# Patient Record
Sex: Female | Born: 1980 | Race: White | Hispanic: No | Marital: Married | Smoking: Current every day smoker
Health system: Southern US, Community
[De-identification: ages and names within clinical notes are randomized; demographics above are authoritative.]

## PROBLEM LIST (undated history)

## (undated) DIAGNOSIS — E119 Type 2 diabetes mellitus without complications: Secondary | ICD-10-CM

## (undated) DIAGNOSIS — IMO0002 Reserved for concepts with insufficient information to code with codable children: Secondary | ICD-10-CM

## (undated) DIAGNOSIS — M329 Systemic lupus erythematosus, unspecified: Secondary | ICD-10-CM

## (undated) DIAGNOSIS — I10 Essential (primary) hypertension: Secondary | ICD-10-CM

## (undated) HISTORY — PX: UTERINE FIBROID SURGERY: SHX826

## (undated) HISTORY — PX: WRIST SURGERY: SHX841

## (undated) HISTORY — PX: SHOULDER SURGERY: SHX246

## (undated) HISTORY — DX: Systemic lupus erythematosus, unspecified: M32.9

---

## 2002-11-13 ENCOUNTER — Ambulatory Visit (HOSPITAL_COMMUNITY): Admission: RE | Admit: 2002-11-13 | Discharge: 2002-11-13 | Payer: Self-pay | Admitting: Family Medicine

## 2002-11-13 ENCOUNTER — Encounter: Payer: Self-pay | Admitting: Family Medicine

## 2002-12-09 ENCOUNTER — Encounter: Admission: RE | Admit: 2002-12-09 | Discharge: 2003-03-09 | Payer: Self-pay | Admitting: Family Medicine

## 2002-12-15 ENCOUNTER — Other Ambulatory Visit: Admission: RE | Admit: 2002-12-15 | Discharge: 2002-12-15 | Payer: Self-pay | Admitting: Obstetrics and Gynecology

## 2003-02-17 ENCOUNTER — Encounter: Payer: Self-pay | Admitting: Obstetrics and Gynecology

## 2003-02-17 ENCOUNTER — Ambulatory Visit (HOSPITAL_COMMUNITY): Admission: RE | Admit: 2003-02-17 | Discharge: 2003-02-17 | Payer: Self-pay | Admitting: Obstetrics and Gynecology

## 2003-05-18 ENCOUNTER — Inpatient Hospital Stay (HOSPITAL_COMMUNITY): Admission: AD | Admit: 2003-05-18 | Discharge: 2003-05-22 | Payer: Self-pay | Admitting: Obstetrics and Gynecology

## 2003-05-19 ENCOUNTER — Encounter (INDEPENDENT_AMBULATORY_CARE_PROVIDER_SITE_OTHER): Payer: Self-pay | Admitting: Specialist

## 2003-10-05 ENCOUNTER — Inpatient Hospital Stay (HOSPITAL_COMMUNITY): Admission: EM | Admit: 2003-10-05 | Discharge: 2003-10-06 | Payer: Self-pay | Admitting: Emergency Medicine

## 2004-02-28 ENCOUNTER — Inpatient Hospital Stay (HOSPITAL_COMMUNITY): Admission: EM | Admit: 2004-02-28 | Discharge: 2004-03-01 | Payer: Self-pay | Admitting: Emergency Medicine

## 2006-07-18 ENCOUNTER — Observation Stay (HOSPITAL_COMMUNITY): Admission: AD | Admit: 2006-07-18 | Discharge: 2006-07-19 | Payer: Self-pay | Admitting: Obstetrics and Gynecology

## 2006-07-20 ENCOUNTER — Inpatient Hospital Stay (HOSPITAL_COMMUNITY): Admission: AD | Admit: 2006-07-20 | Discharge: 2006-07-20 | Payer: Self-pay | Admitting: Obstetrics and Gynecology

## 2006-07-24 ENCOUNTER — Inpatient Hospital Stay (HOSPITAL_COMMUNITY): Admission: AD | Admit: 2006-07-24 | Discharge: 2006-07-24 | Payer: Self-pay | Admitting: Obstetrics and Gynecology

## 2006-08-08 ENCOUNTER — Ambulatory Visit (HOSPITAL_COMMUNITY): Admission: RE | Admit: 2006-08-08 | Discharge: 2006-08-08 | Payer: Self-pay | Admitting: Obstetrics and Gynecology

## 2006-08-08 ENCOUNTER — Encounter (INDEPENDENT_AMBULATORY_CARE_PROVIDER_SITE_OTHER): Payer: Self-pay | Admitting: Specialist

## 2006-12-05 ENCOUNTER — Encounter (INDEPENDENT_AMBULATORY_CARE_PROVIDER_SITE_OTHER): Payer: Self-pay | Admitting: Obstetrics and Gynecology

## 2006-12-05 ENCOUNTER — Ambulatory Visit (HOSPITAL_COMMUNITY): Admission: RE | Admit: 2006-12-05 | Discharge: 2006-12-05 | Payer: Self-pay | Admitting: Obstetrics and Gynecology

## 2007-07-30 ENCOUNTER — Inpatient Hospital Stay (HOSPITAL_COMMUNITY): Admission: AD | Admit: 2007-07-30 | Discharge: 2007-07-30 | Payer: Self-pay | Admitting: Obstetrics and Gynecology

## 2007-11-15 ENCOUNTER — Ambulatory Visit (HOSPITAL_COMMUNITY): Admission: RE | Admit: 2007-11-15 | Discharge: 2007-11-16 | Payer: Self-pay | Admitting: Obstetrics and Gynecology

## 2007-11-15 ENCOUNTER — Encounter (INDEPENDENT_AMBULATORY_CARE_PROVIDER_SITE_OTHER): Payer: Self-pay | Admitting: Obstetrics and Gynecology

## 2009-08-30 ENCOUNTER — Ambulatory Visit (HOSPITAL_COMMUNITY): Admission: RE | Admit: 2009-08-30 | Discharge: 2009-08-30 | Payer: Self-pay | Admitting: Obstetrics and Gynecology

## 2009-11-03 ENCOUNTER — Ambulatory Visit (HOSPITAL_COMMUNITY): Admission: RE | Admit: 2009-11-03 | Discharge: 2009-11-03 | Payer: Self-pay | Admitting: Obstetrics and Gynecology

## 2009-12-08 ENCOUNTER — Emergency Department (HOSPITAL_COMMUNITY): Admission: EM | Admit: 2009-12-08 | Discharge: 2009-12-08 | Payer: Self-pay | Admitting: Emergency Medicine

## 2009-12-10 ENCOUNTER — Inpatient Hospital Stay (HOSPITAL_COMMUNITY): Admission: EM | Admit: 2009-12-10 | Discharge: 2009-12-13 | Payer: Self-pay | Admitting: Emergency Medicine

## 2010-04-29 ENCOUNTER — Ambulatory Visit (HOSPITAL_COMMUNITY)
Admission: RE | Admit: 2010-04-29 | Discharge: 2010-04-29 | Payer: Self-pay | Source: Home / Self Care | Attending: Obstetrics and Gynecology | Admitting: Obstetrics and Gynecology

## 2010-07-22 LAB — COMPREHENSIVE METABOLIC PANEL
AST: 18 U/L (ref 0–37)
Albumin: 3 g/dL — ABNORMAL LOW (ref 3.5–5.2)
BUN: 2 mg/dL — ABNORMAL LOW (ref 6–23)
CO2: 20 mEq/L (ref 19–32)
Calcium: 8.3 mg/dL — ABNORMAL LOW (ref 8.4–10.5)
Chloride: 105 mEq/L (ref 96–112)
Creatinine, Ser: 0.66 mg/dL (ref 0.4–1.2)
GFR calc Af Amer: >60 mL/min (ref >60)
Potassium: 3.8 mEq/L (ref 3.5–5.1)
Total Bilirubin: 0.7 mg/dL (ref 0.3–1.2)
Total Bilirubin: 1 mg/dL (ref 0.3–1.2)

## 2010-07-22 LAB — CARDIAC PANEL(CRET KIN+CKTOT+MB+TROPI)
CK, MB: 0.9 ng/mL (ref 0.3–4.0)
Relative Index: INVALID (ref 0.0–2.5)
Relative Index: INVALID (ref 0.0–2.5)
Total CK: 67 U/L (ref 7–177)
Troponin I: 0.01 ng/mL (ref 0.00–0.06)
Troponin I: 0.02 ng/mL (ref 0.00–0.06)

## 2010-07-22 LAB — GLUCOSE, CAPILLARY
Glucose-Capillary: 100 mg/dL — ABNORMAL HIGH (ref 70–99)
Glucose-Capillary: 111 mg/dL — ABNORMAL HIGH (ref 70–99)
Glucose-Capillary: 127 mg/dL — ABNORMAL HIGH (ref 70–99)
Glucose-Capillary: 128 mg/dL — ABNORMAL HIGH (ref 70–99)
Glucose-Capillary: 128 mg/dL — ABNORMAL HIGH (ref 70–99)
Glucose-Capillary: 143 mg/dL — ABNORMAL HIGH (ref 70–99)
Glucose-Capillary: 152 mg/dL — ABNORMAL HIGH (ref 70–99)
Glucose-Capillary: 153 mg/dL — ABNORMAL HIGH (ref 70–99)
Glucose-Capillary: 154 mg/dL — ABNORMAL HIGH (ref 70–99)
Glucose-Capillary: 157 mg/dL — ABNORMAL HIGH (ref 70–99)
Glucose-Capillary: 168 mg/dL — ABNORMAL HIGH (ref 70–99)
Glucose-Capillary: 170 mg/dL — ABNORMAL HIGH (ref 70–99)
Glucose-Capillary: 179 mg/dL — ABNORMAL HIGH (ref 70–99)
Glucose-Capillary: 183 mg/dL — ABNORMAL HIGH (ref 70–99)
Glucose-Capillary: 186 mg/dL — ABNORMAL HIGH (ref 70–99)
Glucose-Capillary: 203 mg/dL — ABNORMAL HIGH (ref 70–99)
Glucose-Capillary: 239 mg/dL — ABNORMAL HIGH (ref 70–99)
Glucose-Capillary: 241 mg/dL — ABNORMAL HIGH (ref 70–99)
Glucose-Capillary: 300 mg/dL — ABNORMAL HIGH (ref 70–99)
Glucose-Capillary: 308 mg/dL — ABNORMAL HIGH (ref 70–99)
Glucose-Capillary: 334 mg/dL — ABNORMAL HIGH (ref 70–99)
Glucose-Capillary: 417 mg/dL — ABNORMAL HIGH (ref 70–99)
Glucose-Capillary: 57 mg/dL — ABNORMAL LOW (ref 70–99)
Glucose-Capillary: 94 mg/dL (ref 70–99)

## 2010-07-22 LAB — URINALYSIS, ROUTINE W REFLEX MICROSCOPIC
Bilirubin Urine: NEGATIVE
Glucose, UA: >1000 mg/dL — AB
Ketones, ur: 40 mg/dL — AB
Leukocytes, UA: NEGATIVE
Nitrite: NEGATIVE
Nitrite: NEGATIVE
Protein, ur: 30 mg/dL — AB
Specific Gravity, Urine: 1.037 — ABNORMAL HIGH (ref 1.005–1.030)
Urobilinogen, UA: 0.2 mg/dL (ref 0.0–1.0)
pH: 5.5 (ref 5.0–8.0)

## 2010-07-22 LAB — BASIC METABOLIC PANEL
BUN: 13 mg/dL (ref 6–23)
BUN: 2 mg/dL — ABNORMAL LOW (ref 6–23)
BUN: 24 mg/dL — ABNORMAL HIGH (ref 6–23)
BUN: 6 mg/dL (ref 6–23)
CO2: 19 mEq/L (ref 19–32)
CO2: 21 mEq/L (ref 19–32)
CO2: 24 mEq/L (ref 19–32)
CO2: 25 mEq/L (ref 19–32)
CO2: 28 mEq/L (ref 19–32)
Calcium: 7.9 mg/dL — ABNORMAL LOW (ref 8.4–10.5)
Calcium: 8 mg/dL — ABNORMAL LOW (ref 8.4–10.5)
Calcium: 8.1 mg/dL — ABNORMAL LOW (ref 8.4–10.5)
Calcium: 9.2 mg/dL (ref 8.4–10.5)
Chloride: 104 mEq/L (ref 96–112)
Chloride: 104 mEq/L (ref 96–112)
Chloride: 104 mEq/L (ref 96–112)
Chloride: 106 mEq/L (ref 96–112)
Chloride: 107 mEq/L (ref 96–112)
Chloride: 107 mEq/L (ref 96–112)
Chloride: 112 mEq/L (ref 96–112)
Creatinine, Ser: 0.61 mg/dL (ref 0.4–1.2)
Creatinine, Ser: 0.62 mg/dL (ref 0.4–1.2)
Creatinine, Ser: 0.63 mg/dL (ref 0.4–1.2)
Creatinine, Ser: 0.73 mg/dL (ref 0.4–1.2)
Creatinine, Ser: 0.92 mg/dL (ref 0.4–1.2)
Creatinine, Ser: 1 mg/dL (ref 0.4–1.2)
Creatinine, Ser: 1.26 mg/dL — ABNORMAL HIGH (ref 0.4–1.2)
GFR calc Af Amer: 60 mL/min — ABNORMAL LOW (ref >60)
GFR calc Af Amer: >60 mL/min (ref >60)
GFR calc Af Amer: >60 mL/min (ref >60)
GFR calc Af Amer: >60 mL/min (ref >60)
GFR calc Af Amer: >60 mL/min (ref >60)
GFR calc Af Amer: >60 mL/min (ref >60)
GFR calc non Af Amer: >60 mL/min (ref >60)
GFR calc non Af Amer: >60 mL/min (ref >60)
GFR calc non Af Amer: >60 mL/min (ref >60)
Glucose, Bld: 193 mg/dL — ABNORMAL HIGH (ref 70–99)
Glucose, Bld: 241 mg/dL — ABNORMAL HIGH (ref 70–99)
Glucose, Bld: 323 mg/dL — ABNORMAL HIGH (ref 70–99)
Glucose, Bld: 79 mg/dL (ref 70–99)
Potassium: 3.2 mEq/L — ABNORMAL LOW (ref 3.5–5.1)
Potassium: 3.3 mEq/L — ABNORMAL LOW (ref 3.5–5.1)
Potassium: 3.4 mEq/L — ABNORMAL LOW (ref 3.5–5.1)
Potassium: 4 mEq/L (ref 3.5–5.1)
Sodium: 137 mEq/L (ref 135–145)
Sodium: 139 mEq/L (ref 135–145)
Sodium: 140 mEq/L (ref 135–145)

## 2010-07-22 LAB — CBC
HCT: 31.3 % — ABNORMAL LOW (ref 36.0–46.0)
HCT: 34.1 % — ABNORMAL LOW (ref 36.0–46.0)
HCT: 37.2 % (ref 36.0–46.0)
Hemoglobin: 11.3 g/dL — ABNORMAL LOW (ref 12.0–15.0)
Hemoglobin: 11.8 g/dL — ABNORMAL LOW (ref 12.0–15.0)
Hemoglobin: 12.4 g/dL (ref 12.0–15.0)
MCH: 26.9 pg (ref 26.0–34.0)
MCH: 27.1 pg (ref 26.0–34.0)
MCH: 27.1 pg (ref 26.0–34.0)
MCHC: 33.1 g/dL (ref 30.0–36.0)
MCHC: 33.4 g/dL (ref 30.0–36.0)
MCV: 80.6 fL (ref 78.0–100.0)
MCV: 80.9 fL (ref 78.0–100.0)
MCV: 81 fL (ref 78.0–100.0)
Platelets: 214 10*3/uL (ref 150–400)
Platelets: 218 10*3/uL (ref 150–400)
Platelets: 237 10*3/uL (ref 150–400)
Platelets: 272 10*3/uL (ref 150–400)
RBC: 4.37 MIL/uL (ref 3.87–5.11)
RBC: 4.6 MIL/uL (ref 3.87–5.11)
RDW: 17.3 % — ABNORMAL HIGH (ref 11.5–15.5)
WBC: 17.6 10*3/uL — ABNORMAL HIGH (ref 4.0–10.5)
WBC: 7.4 10*3/uL (ref 4.0–10.5)

## 2010-07-22 LAB — DIFFERENTIAL
Basophils Absolute: 0.1 10*3/uL (ref 0.0–0.1)
Basophils Relative: 1 % (ref 0–1)
Eosinophils Absolute: 0 10*3/uL (ref 0.0–0.7)
Eosinophils Relative: 0 % (ref 0–5)
Lymphocytes Relative: 11 % — ABNORMAL LOW (ref 12–46)
Lymphocytes Relative: 4 % — ABNORMAL LOW (ref 12–46)
Lymphs Abs: 0.7 10*3/uL (ref 0.7–4.0)
Lymphs Abs: 2 10*3/uL (ref 0.7–4.0)
Monocytes Absolute: 0.5 10*3/uL (ref 0.1–1.0)
Monocytes Absolute: 0.7 10*3/uL (ref 0.1–1.0)
Monocytes Relative: 3 % (ref 3–12)
Neutro Abs: 15.7 10*3/uL — ABNORMAL HIGH (ref 1.7–7.7)
Neutrophils Relative %: 85 % — ABNORMAL HIGH (ref 43–77)
Neutrophils Relative %: 93 % — ABNORMAL HIGH (ref 43–77)

## 2010-07-22 LAB — URINE MICROSCOPIC-ADD ON

## 2010-07-22 LAB — BLOOD GAS, ARTERIAL
Bicarbonate: 16.2 mEq/L — ABNORMAL LOW (ref 20.0–24.0)
Drawn by: 311371
pCO2 arterial: 25.7 mmHg — ABNORMAL LOW (ref 35.0–45.0)
pH, Arterial: 7.4 (ref 7.350–7.400)
pO2, Arterial: 65.9 mmHg — ABNORMAL LOW (ref 80.0–100.0)

## 2010-07-22 LAB — HEPATIC FUNCTION PANEL
ALT: 10 U/L (ref 0–35)
ALT: 11 U/L (ref 0–35)
AST: 13 U/L (ref 0–37)
AST: 15 U/L (ref 0–37)
Albumin: 2.9 g/dL — ABNORMAL LOW (ref 3.5–5.2)
Alkaline Phosphatase: 68 U/L (ref 39–117)
Indirect Bilirubin: 0.9 mg/dL (ref 0.3–0.9)
Total Bilirubin: 0.4 mg/dL (ref 0.3–1.2)
Total Protein: 6.1 g/dL (ref 6.0–8.3)

## 2010-07-22 LAB — POCT PREGNANCY, URINE: Preg Test, Ur: NEGATIVE

## 2010-07-22 LAB — KETONES, QUALITATIVE

## 2010-07-22 LAB — LIPASE, BLOOD: Lipase: 18 U/L (ref 11–59)

## 2010-07-24 LAB — SURGICAL PCR SCREEN: MRSA, PCR: NEGATIVE

## 2010-07-24 LAB — GLUCOSE, CAPILLARY
Glucose-Capillary: 104 mg/dL — ABNORMAL HIGH (ref 70–99)
Glucose-Capillary: 152 mg/dL — ABNORMAL HIGH (ref 70–99)
Glucose-Capillary: 98 mg/dL (ref 70–99)

## 2010-07-24 LAB — CBC
Hemoglobin: 13.1 g/dL (ref 12.0–15.0)
Platelets: 335 10*3/uL (ref 150–400)
RDW: 17.1 % — ABNORMAL HIGH (ref 11.5–15.5)

## 2010-07-24 LAB — COMPREHENSIVE METABOLIC PANEL
ALT: 10 U/L (ref 0–35)
Albumin: 4.3 g/dL (ref 3.5–5.2)
Alkaline Phosphatase: 66 U/L (ref 39–117)
Potassium: 4.3 mEq/L (ref 3.5–5.1)
Sodium: 137 mEq/L (ref 135–145)
Total Protein: 7.8 g/dL (ref 6.0–8.3)

## 2010-07-24 LAB — PREGNANCY, URINE: Preg Test, Ur: NEGATIVE

## 2010-09-20 NOTE — H&P (Signed)
NAMEKAIJAH, ABTS                  ACCOUNT NO.:  [id number]   MEDICAL RECORD NO.:  [id number]          PATIENT TYPE:  AMB   LOCATION:  SDC                           FACILITY:  [facility]   PHYSICIAN:  Naima A. Dillard, M.D. DATE OF BIRTH:  1980-05-12   DATE OF ADMISSION:  DATE OF DISCHARGE:                              HISTORY & PHYSICAL   CHIEF COMPLAINT:  High-grade dysplasia, endocervical polyp.   Patient is a 30 year old gravida 2, para 0, 1-1-0, who presented for  colposcopy secondary to a high-grade squamous epithelial lesion found on  Pap smear.  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  Patient was also found to have a  large fibroid about 5 cm.  She wanted to know if it interfered with  endometrial canal, so a sonohystogram was done, and there was a  hyperechoic posterior fundal mass, which could be a submucosal fibroid  versus a polyp.  The large fibroid at the fundus did not appear  submucosal on sonohystogram.  The uterus is 12 x 9 x 9.  Both ovaries  are normal.  The hyperechoic mass measures 1.78 x 1.19.   Past OB history is significant for vaginal delivery with an intrauterine  fetal demise at 69 weeks in January, 2005 and a missed AB at 13 weeks in  2008.   PAST MEDICAL HISTORY:  Patient has juvenile diabetes that is poorly  controlled.   FAMILY HISTORY:  Maternal grandmother has hypertension.  Paternal  grandmother is diabetic.   GENETIC HISTORY:  Unremarkable.   SOCIAL HISTORY:  Patient is married.  The father of the baby is  supportive.  She is a tobacco user.  Has occasional alcohol and no  illicit drug use.   REVIEW OF SYSTEMS:  Endocrine is significant for juvenile diabetes.  GENITOURINARY:  As above.  CARDIOVASCULAR:  No heart palpitations.  MUSCULOSKELETAL:  No weakness.  GI:  No constipation.  Patient denies  having any medication or latex allergies.   PHYSICAL EXAMINATION:  Patient is 5 feet 3.  She  weighs 140 pounds.  Blood pressure is 130/80.  Patient is in no apparent distress.  Head is normocephalic and atraumatic.  HEART:  Regular rate and rhythm.  SKIN:  Moist mucous membranes.  LUNGS:  Clear to auscultation bilaterally.  ABDOMEN:  Soft and nontender.  EXTREMITIES:  No clubbing, cyanosis or edema.  PELVIC:  Vulvovaginal exam is within normal limits.  Cervix is nontender  without any lesions.  Uterus is about 12 weeks size, mobile and  nontender.  Adnexa has no masses.   DIAGNOSIS:  High grade dysplasia and endometrial mass.   PLAN:  D&C.  Hysteroscopy to remove endometrial mass, and LEEP.  Patient  understands the risks are not limited to bleeding, infection, damage to  internal organs such as bowel, bladder, major blood vessels, or  perforation of the uterus.  Also a chance for cervical stenosis or  cervical incompetence as a result of LEEP.  Patient was also encouraged  to stop smoking and  told that this could potentiate the spread of the  virus.      Naima A. Normand Sloop, M.D.  Electronically Signed     NAD/MEDQ  D:  12/04/2006  T:  12/05/2006  Job:  [id number]

## 2010-09-20 NOTE — Op Note (Signed)
[name], [name]                  ACCOUNT NO.:  [id number]   MEDICAL RECORD NO.:  [id number]          PATIENT TYPE:  AMB   LOCATION:  SDC                           FACILITY:  [facility]   PHYSICIAN:  Naima A. Dillard, M.D. DATE OF BIRTH:  05/24/1980   DATE OF PROCEDURE:  12/05/2006  DATE OF DISCHARGE:                               OPERATIVE REPORT   PREOPERATIVE DIAGNOSIS:  Endometrial mass and cervical dysplasia.   POSTOPERATIVE DIAGNOSIS:  Cervical dysplasia.   PROCEDURE:  Fractional dilatation and curettage, hysteroscopy, cervical  LEEP.   SURGEON:  Naima A. Dillard, M.D.   ASSISTANT:  None.   ANESTHESIA:  MAC and local.   FINDINGS:  Retroverted ten weeks size uterus.  No adnexal masses  palpated. Normal appearing uterine cavity.  No polyps or submucosal  fibroids seen.  Abnormal area seen on the cervix with Lugol's at 12 and  6 o'clock.   SPECIMENS:  Endocervical curettings, endometrial curettings and cervical  LEEP, all sent to pathology.   ESTIMATED BLOOD LOSS:  Minimal.   COMPLICATIONS:  None.   DISPOSITION:  The patient went to the PACU in stable condition.   PROCEDURE IN DETAIL:  The patient was taken to the operating room where  she was given anesthesia, placed in the dorsal lithotomy position,  prepped and draped in a normal sterile fashion.  The bladder was  drained.  The uterus was sounded to 9 cm and was retroverted. The cervix  was further dilated with Pratt dilators up to 21. The hysteroscope was  placed into the uterine cavity.  Both ostia were visualized.  The  patient's right ostia was a little bit more difficult to see as it was  more retroverted, but with maneuvering of the camera, we were able to  see it.  There were no endometrial polyps or submucosal fibroids or  endometrial masses seen.  I watched as the water decreased out of the  uterus and still no polyps were seen.  The hysteroscope was removed.  A  sharp curettage was done in the endocervix and  that was sent to  pathology.  A sharp curettage was done of the uterus just along the four  walls one time, very short, and that was also sent to pathology.  The  metal speculum was removed and the coated speculum was placed into the  vagina.  The anterior lip of the cervix was further grasped with a  tenaculum.  Lugol's was applied, there some white areas seen at 12 and  6.  A LEEP was done with the 20 mm loop without difficulty.  The  LEEP specimen was placed on a cork board and sent to pathology.  The  cervical bed was made hemostatic with Bovie cautery.  20 mL of 1%  lidocaine was used as a cervical block.  All instruments were removed  from the vagina and cervix.  Sponge, lap and needle counts were correct.  The patient went to the recovery room in stable condition.      Naima A. Normand Sloop, M.D.  Electronically Signed  NAD/MEDQ  D:  12/05/2006  T:  12/05/2006  Job:  [id number]

## 2010-09-20 NOTE — Op Note (Signed)
[name], [name]                  ACCOUNT NO.:  [id number]   MEDICAL RECORD NO.:  [id number]          PATIENT TYPE:  OIB   LOCATION:  0098                         FACILITY:  [facility]   PHYSICIAN:  Crist Fat. Rivard, M.D. DATE OF BIRTH:  02-10-1981   DATE OF PROCEDURE:  11/15/2007  DATE OF DISCHARGE:                               OPERATIVE REPORT   PREOPERATIVE DIAGNOSIS:  Symptomatic uterine fibroid.   POSTOPERATIVE DIAGNOSIS:  Symptomatic uterine fibroid with pelvic  adhesions.   ANESTHESIA:  General.   PROCEDURE:  Robotic-assisted laparoscopy myomectomy with lysis of  adhesions.   SURGEON:  Dr. Estanislado Pandy.   ASSISTANT:  Elmira J. Adline Peals.   ESTIMATED BLOOD LOSS:  400 mL.   PROCEDURE:  After being informed of the planned procedure with possible  complications including bleeding, infection, injury to bowel, bladder or  ureters, need for laparotomy, increased length of surgical time as well  as transient facial edema, informed consent is obtained.  The patient is  taken to OR #10, given general anesthesia with endotracheal intubation  without any complication.  She is placed in the lithotomy position on a  sticky mattress, arms padded and tucked on each side with knee-high  sequential compressive devices.  She is prepped and draped in a sterile  fashion, and a Foley catheter is inserted in her bladder.  Pelvic exam  reveals an anteverted uterus with a large anterior fibroid reaching  almost the umbilicus.  The uterus is mobile.  A weighted speculum is  inserted.  Anterior lip of the cervix was grasped with a tenaculum  forceps, and the cervix is easily dilated using Hegar dilator until #30  which allows for the uterus to be sounded at 16 cm.  This allows easy  insertion of a #10 Rumi intrauterine manipulator with a 3.5-mm co-ring.  The balloon is inflated with 10 mL of saline.  The weighted speculum is  removed, and the tenaculum is changed for a Jacobs forceps to allow for  counter traction during the myomectomy.   Our port sites are measured and chosen, and we place a 10-mm  supraumbilical camera port by infiltrating with Marcaine 0.25 and  performing a 10-mm vertical incision.  This incision is brought down  bluntly to the fascia which is identified and grasped with Kocher  forceps.  The fascia is then incised.  Peritoneum is identified, grasped  and incised.  A pursestring suture of 0 Vicryl is placed on the fascia,  and a 10-mm Hassan trocar is easily inserted in this incision.  It is  held in place with a pursestring suture, and it allows [place] to safely  insufflate pneumoperitoneum with CO2 at a maximum pressure of 16 mmHg.   On the left side, we then place two 8-mm robotic trocars under direct  visualization after infiltrating with Marcaine 0.25, and on the right  side, we place one 8-mm robotic trocar and a 10-mm patient-side  assistant trocar under direct visualization after infiltrating with  Marcaine 0.25.  Decision is made to dock the robot on the left side of  the  patient, so the table is slightly turned to admit a 45-degree  approach of the robot.  The robot is then easily docked, and a monopolar  scissors is used in arm #1, a PK gyrus forceps in arm #2 and a tenaculum  forceps in arm #3.  Preparation and docking time takes 1 hour.   We then start console time at 1:33 p.m.  Pelvic observation:  The uterus  is greatly enlarged with what appears to be a 14-15 cm fibroid mainly on  the anterior part of the uterus, allowing [place] to see both cornua slightly  posterior to that site.  Because of the weight and the size of the  uterus, it is impossible to manipulate with the Rumi.  Using our robotic  arms, we lift the uterus up to see a completely obliterated posterior  cul-de-sac with fine filmy adhesions grade 1 and grade 2 with ovaries  and tubes stuck in there.  We proceed with sharp lysis of adhesions to  free the bowels away from that, so we can  move the uterus and the cul-de-  sac is free, but ovaries and tubes will be addressed at a later time.   Using a dilution of vasopressin 100 units in 100 mL,  using a spinal  needle, we infiltrate the serosa of the uterus anteriorly until we  obtain a complete blanching.  We then perform a vertical anterior  incision on a 10-cm length using monopolar scissors.  This is continued  down until we can identify the fibroid and grasp it with tenaculum  forceps.  At this point, we realize that we will mainly need an anterior  superior traction, and so we switch the tenaculum to insert in arm #2  and the PK forceps to insert in arm #3, and this gives [place] a much better  traction on the fibroid.  For the next hour, we proceed with sharp and  blunt dissection of the fibroid away from the uterus.  When we approach  the posterior aspect of the fibroid, we are very, very close to the  uterine cavity which was finally entered just due to the tip of the Rumi  interim uterine manipulator poking through during uterine manipulation.  We are able to remove the whole fibroid and deposit it in the right  paracolic gutter.  We then proceed with systematic closure of the  myometrial defect, and in order to proceed, we remove our tenaculum  forceps and our monopolar scissors and replace them with a large needle  holder and a suture cut.  Using 0 Vicryl and multiple layers of figure-  of-eight stitches, we are able to close the defect completely until the  serosa is reapproximated completely.  This also provides with a  satisfactory hemostasis.   Using a 2-0 Vicryl, we can now proceed with a baseball suture of the  serosa which provides even further hemostasis.   At this time, we irrigate profusely and note a satisfactory hemostasis,  and we decide to put our attention to the posterior cul-de-sac to free  tubes and ovaries.  For the next hour, we proceed with systematic lysis  of adhesions, freeing both ovaries  and both tubes.  Unfortunately, we  are unable to identify any fimbrial end to both of the tubes, and  because of the significant edema of the uterus following this  myomectomy, we are unable to proceed with a chromopertubation.  After  the sharp lysis of adhesion is completed, we irrigate  profusely multiple  times the pelvis to hopefully reduce the amount of postoperative  adhesions.  Hemostasis is deemed very adequate, and a full sheet of  Interceed is then deposited on the posterior aspect of the uterus  covering the tubes and covering the posterior cul-de-sac to avoid future  adhesion.  The uterus is brought down, hemostasis is still adequate and  we decide to proceed with the morcellation of the fibroid without the  robot.  Instruments are removed, the robot is undocked and we now  proceed with systematic morcellation of the fibroid.  Console time is  almost 3 hours.  The 10-mm patient-side assistant trocar is removed, the  incision is extended, and the Gynecare morcellated is inserted under  direct visualization.  We then proceed with systematic morcellation of  the specimen which takes another 45 minutes.  All debris are removed.  We again irrigate profusely and then reverse Trendelenburg in order to  clean the pelvis as much as possible.  Before removing trocars and  instruments, Tisseel is deposited on the uterine incision for further  hemostasis.  All trocars are then removed under direct visualization  after evacuation of the pneumoperitoneum.   The fascia of the supraumbilical incision is closed with the previously  placed pursestring suture of 0 Vicryl, and the fascia of right patient-  side assistant trocar is closed with a figure-of-eight stitch of 0  Vicryl.  The skin of all incision is then closed with subcuticular  suture of 3 Monocryl and Dermabond.   The intrauterine manipulator is removed with its co-ring, and a cervical  laceration is repaired with a  figure-of-eight stitch of 0 Vicryl.   Instrument and sponge count are complete x2.  Estimated blood loss is  400 mL.  The procedure is very well tolerated by the patient who is  taken to recovery room in a well and stable condition.   Because of entry in the endometrial cavity, the patient will be informed  of the need for cesarean delivery at future pregnancies.   SPECIMEN:  Uterine fibroid is weighed at 210 grams and is sent to  pathology.   TOTAL OPERATIVE TIME:  Including 1 hour preparation and 45 minutes of  morcellation is 5 hours and 15 minutes.      Crist Fat Rivard, M.D.  Electronically Signed     SAR/MEDQ  D:  11/15/2007  T:  11/15/2007  Job:  [id number]

## 2010-09-20 NOTE — H&P (Signed)
[name], [name]                  ACCOUNT NO.:  [id number]   MEDICAL RECORD NO.:  [id number]          PATIENT TYPE:  AMB   LOCATION:  DAY                          FACILITY:  [facility]   PHYSICIAN:  Crist Fat. Rivard, M.D. DATE OF BIRTH:  01/24/1981   DATE OF ADMISSION:  DATE OF DISCHARGE:                              HISTORY & PHYSICAL   HISTORY OF PRESENT ILLNESS:  [name] is a 30 year old single white  female para 1-0-1-1, who presents for a robot assisted myomectomy  because of symptomatic uterine fibroids.  The patient was seen in March  2009, in the emergency department because of irregular vaginal bleeding  and dysmenorrhea.  At that time, a pelvic ultrasound showed that she had  a large subserosal fibroid measuring 9.1 cm.  Additionally, she was  diagnosed with gonorrhea and treated at that time with a test-of-cure in  April 2009, showing negative results.  The patient was then seen in  April 2009 at [facility], complaining of continued  menstrual flow which lasted for 7 days, causing her to change a pad  every 2 hours, accompanied by large clots.  She also reports cramping  which she rates as a 5/10 on 10 point pain scale; however, she can find  relief with over-the-counter analgesia.  Her hemoglobin at that time was  found to be 8.4.  The patient was recommended to begin birth control  pills, which was suggested to be taking continuously; however, the  patient stated that she was not able to tolerate that medication and  therefore did not comply.  A follow-up pelvic ultrasound in May 2009,  showed a uterus measuring 10.0 x 11.8 x 10.4 with a subserosal anterior  fibroid extending from the anterior junction of the endomyometrium and  measuring 8.8 x 7.0 x 10.3 cm.  The patient denies any changes in her  bowel habits, urinary tract symptoms, dyspareunia or vaginitis symptoms.  Given the patient's desire to preserve fertility and the significant  discomfort endured by  her menstrual cycle, she has decided to proceed  with myomectomy as a means of managing her uterine fibroids.   PAST MEDICAL HISTORY:   OB HISTORY:  Gravida 2, para 1-1-1-0.  The patient had an intrauterine  fetal demise in 2005 and a spontaneous abortion in 2008.   GYN HISTORY:  Menarche 30 years old.  Last menstrual period October 15, 2007.  The patient does have a history of gonorrhea and an abnormal Pap  smear for which she received a colposcopy in 2008, returning CIN II and  III.  The patient's Pap smears have been normal since that time.   MEDICAL HISTORY:  1. Insulin-dependent diabetes.  2. Anemia.  3. Anxiety.  4. Acid reflux.   SURGICAL HISTORY:  1. In 2008, D&E for spontaneous abortion.  2. In 2008, LEEP for CIN-III.   FAMILY HISTORY:  Cancer, diabetes, cardiovascular disease, hypertension  and anxiety.   SOCIAL HISTORY:  The patient is single and she works as a Child psychotherapist.   HABITS:  She smokes one pack of cigarettes per day.  Denies any history  of alcohol or illicit drug use.   CURRENT MEDICATIONS:  1. NovoLog Pen, which she administers on a sliding scale.  2. Lantus 21 units at bedtime.  3. Iron 1 tablet daily.  4. Sertraline HCL 50 mg daily.  5. Prilosec OTC as needed.   ALLERGIES:  SHE HAS NO KNOWN DRUG ALLERGIES AND DENIES ANY LATEX  ALLERGIES.   REVIEW OF SYSTEMS:  The patient denies any chest pain, shortness of  breath, night sweats, fever, headache, vision changes, and except as is  mentioned in history of present illness, the patient's review of systems  is negative.   PHYSICAL EXAMINATION:  VITAL SIGNS:  Blood pressure 130/72, pulse 82,  weight 139, height 5 feet 4 inches.  The patient's body mass index is  23.9.  NECK:  Supple without masses.  There is no thyromegaly or cervical  adenopathy.  HEART:  Regular rate and rhythm.  There is no murmur.  LUNGS:  Clear.  There are no wheezes, rales or rhonchi.  BACK:  No CVA tenderness.  ABDOMEN:   Without tenderness or organomegaly.  However, there is a firm  mass arising from the pelvis to approximately three fingerbreadths above  the symphysis pubis.  EXTREMITIES:  Without clubbing, cyanosis or edema.  PELVIC:  EG/BUS is normal.  Vagina is normal.  Cervix is nontender  without lesions.  Uterus appears 14 weeks size and is without  tenderness.  Adnexa without tenderness or masses.   IMPRESSION:  1. Symptomatic uterine fibroids.  2. Insulin-dependent diabetes.   DISPOSITION:  A discussion was held with the patient regarding the  indications for her procedure along with its risks, which include, but  are not limited to reaction to anesthesia, damage to adjacent organs,  infection, excessive bleeding, the possibility of the need for an open  abdominal incision and the possibility of needing a cesarean section  should she become pregnant.  The patient verbalized understanding of  these risks.  She was also given a MiraLax bowel prep to be administered  24 hours prior to her procedure.  The patient has scheduled for a robot  assisted myomectomy at [facility] on November 15, 2007,  at 12 noon.      Elmira J. Adline Peals.      Crist Fat Rivard, M.D.  Electronically Signed    EJP/MEDQ  D:  11/06/2007  T:  11/06/2007  Job:  [id number]

## 2010-09-20 NOTE — H&P (Signed)
NAMERONNE, SAVOIA                  ACCOUNT NO.:  [id number]   MEDICAL RECORD NO.:  [id number]         PATIENT TYPE:  LAMB   LOCATION:                                FACILITY:  [facility]   PHYSICIAN:  Crist Fat. Rivard, M.D. DATE OF BIRTH:  30-Sep-1980   DATE OF ADMISSION:  11/15/2007  DATE OF DISCHARGE:                              HISTORY & PHYSICAL   ADDENDUM TO HISTORY./PHYSICAL EXAMINATION   Please add to send a copy of patient's history and physical examination  to:   Dr. Jaymes Graff.      Elmira J. Adline Peals.      Crist Fat Rivard, M.D.  Electronically Signed    EJP/MEDQ  D:  11/06/2007  T:  11/06/2007  Job:  [id number]   cc:   Naima A. Normand Sloop, M.D.  Fax: [phone]

## 2010-09-23 NOTE — H&P (Signed)
[name], [name]                  ACCOUNT NO.:  [id number]   MEDICAL RECORD NO.:  [id number]          PATIENT TYPE:  INP   LOCATION:  A206                          FACILITY:  [facility]   PHYSICIAN:  Margaretmary Dys, M.D.DATE OF BIRTH:  09-Sep-1980   DATE OF ADMISSION:  02/28/2004  DATE OF DISCHARGE:                                HISTORY & PHYSICAL   ADMISSION DIAGNOSES:  1.  Diabetic ketoacidosis.  2.  Nausea and vomiting.   CHIEF COMPLAINT:  Nausea and vomiting this morning.   HISTORY OF PRESENT ILLNESS:  [name] is a 30 year old Caucasian female with  a history of type 1 diabetes, presents to the emergency room with a 1 day  history of nausea and vomiting which started this morning.  The patient  denies any abdominal pain, denies any fever, chills, or rigors, denies any  headache, dizziness, or lightheadedness.  The patient checked her sugar, and  she found it to be too high and subsequently came into the emergency room.  Preceding this yesterday, the patient said she was fine and went to bed well  with no complaints.  Initial evaluation in the emergency room revealed  elevated blood sugar in the 500s.  She was also acidotic, and urines were  positive for ketones.  The patient is subsequently admitted now for diabetic  ketoacidosis.  She denies any weakness at this time.  She denied any  frequency, urgency, or dysuria.   REVIEW OF SYSTEMS:  As mentioned in history of present illness above.   PAST MEDICAL HISTORY:  Diabetes mellitus, type 1.   MEDICATIONS:  1.  Insulin Lantus 50 units q.h.s.  2.  Sliding-scale t.i.d.  Novolin N.   ALLERGIES:  She has no known drug allergies.   FAMILY HISTORY:  Positive for history of hypertension and diabetes.   SOCIAL HISTORY:  Single, lives with boyfriend.  Has a GED.  She works as a  Child psychotherapist.  She smokes about 1 pack a day.  She denies any alcohol or drug  use.   PHYSICAL EXAMINATION:  GENERAL:  Conscious, alert, comfortable, not in  acute  distress.  VITAL SIGNS:  Blood pressure was 116/72, pulse 126, respirations 24,  temperature 97.2 degrees F.  HEENT:  Normocephalic, atraumatic.  Oral mucosa was dry with no exudate  noted.  NECK:  Supple.  No JVD.  No lymphadenopathy.  LUNGS:  Clear bilaterally with good air entry bilaterally.  HEART:  S1, S2 regular.  No S3, murmurs, gallops, or rubs.  Tachycardic.  ABDOMEN:  Soft, nontender; bowel sounds were positive.  No masses palpable.  EXTREMITIES:  No pitting pedal edema.  No calf induration or tenderness was  noted.   LABORATORY STUDIES DIAGNOSTIC STUDIES:  White blood cell count 19.3,  hemoglobin 16.5, hematocrit 49.  Platelet count was 418.  Neutrophil was  84%.  Lymphocytes are 13%.  Urinalysis was positive for glucose and also  positive for hemoglobin and was positive for ketones greater than 80.  Esterase and nitrates were negative.  Urine microscopy was negative.   Sodium was 133, potassium 5.2,  chloride 104, carbon dioxide 9, glucose 510,  BUN 21, creatinine 1.5, calcium 8.4, total protein 8, albumin 4.8, AST 26,  ALT 18, alkaline phosphatase 159, total bilirubin 1.6, amylase 42.   ASSESSMENT AND PLAN:  A 30 year old Caucasian female with a history of type  1 diabetes, presenting with nausea and vomiting.  Pregnancy test was  negative.  Chest x-ray was also negative.  It does appear that patient has  diabetic ketoacidosis probably from acute viral gastritis, although the  ketoacidosis may have preceded her nausea and vomiting.  The plan will be to  admit her to the medical floor and put her on IV fluids normal saline for  hydration.  Will also start her on a continuous insulin infusion until  acidosis improves.   The patient does not have any evidence of infection at this time.  We will  continue to monitor vital.  Her leukocytosis of 19,00 may be related to an  acute viral illness or from severe dehydration from admitting diabetic  ketoacidosis.  We will  follow up in the morning.       AM/MEDQ  D:  02/28/2004  T:  02/28/2004  Job:  [id number]

## 2010-09-23 NOTE — Discharge Summary (Signed)
NAMEKIMYATA, [name]                  ACCOUNT NO.:  [id number]   MEDICAL RECORD NO.:  [id number]          PATIENT TYPE:  OBV   LOCATION:  9305                          FACILITY:  [facility]   PHYSICIAN:  Hal Morales, M.D.DATE OF BIRTH:  [birth year]   DATE OF ADMISSION:  07/18/2006  DATE OF DISCHARGE:  07/19/2006                               DISCHARGE SUMMARY   ADMISSION DIAGNOSES:  1. Intrauterine pregnancy at 9 weeks.  2. Nausea and vomiting.  3. Juvenile diabetes.   DISCHARGE DIAGNOSES:  1. Intrauterine pregnancy at 9 weeks.  2. Nausea and vomiting.  3. Juvenile diabetes.  4. Resolution of nausea and vomiting.   HOSPITAL PROCEDURES:  1. Intravenous hydration.  2. Stool cultures.   HOSPITAL COURSE:  The patient was admitted at 39 weeks' gestation with  nausea and vomiting x24 hours.  Phenergan was not working.  She was not  having abdominal pain.  She received IV fluids with Phenergan and was  feeling somewhat better but was admitted it for further hydration and  management of her blood sugars and in order to obtain stool specimens  for ova and parasites and culture.  She had blood sugars of 127 and 217  on admission.  She had lab work done for a comprehensive metabolic  panel, which was generally within normal limits except for an elevated  blood sugar of 195.  CBC showed a white blood cell count of 14.4,  hemoglobin of 13.3, platelets of 247.  Urine was positive for greater  than 1000 glucose and 40 of ketones.  She was given IV fluids throughout  the night and basal insulin plus sliding scale was given.  On March 13  her CMET was still within normal limits.  Her white blood cell count was  down to 8.2.  Her blood sugars were 312 and 269, 57 at fasting, 104 and  167 postprandial.  She had one blood sugar of 52, which occurred after  giving a sliding scale dose.  On July 19, 2006, she was feeling better.  She was no longer vomiting.  She was tolerating regular diet.  Vital  signs were stable.  She was afebrile.  She was seen by the maternal-  fetal medicine physician for consultation.  She was deemed to have  received the full benefit of her hospital stay and she was discharged  home.   CONDITION ON DISCHARGE:  Good.   DISCHARGE MEDICATIONS:  The patient has instructions to decrease  baseline insulin based on her blood sugars from her endocrinologist.  She will continue to use Lantus and NovoLog combination insulin.  She  will also continue her prenatal vitamins.   DISCHARGE LABS:  Sodium 138, potassium 3.3, glucose 51, creatinine of  0.54, AST 15, ALT 11.  White blood cell count 8.2, hemoglobin 11.7,  platelets 202.   DISCHARGE INSTRUCTIONS:  Routine care and diet as tolerated.   Discharge follow-up will occur with Dr. Normand Sloop on July 24, 2006, with  an additional first trimester screen visit on July 26, 2006.  She also  has an appointment with  her endocrinologist in the coming week to manage  her diabetes.      Marie L. Williams, C.N.M.      Hal Morales, M.D.  Electronically Signed    MLW/MEDQ  D:  07/19/2006  T:  07/19/2006  Job:  [id number]

## 2010-09-23 NOTE — Op Note (Signed)
NAMEMERLE, [name]                  ACCOUNT NO.:  [id number]   MEDICAL RECORD NO.:  [id number]          PATIENT TYPE:  AMB   LOCATION:  SDC                           [facility]ACILITY:  [facility]   PHYSICIAN:  Naima A. Dillard, M.D. DATE OF BIRTH:  03/21/1981   DATE OF PROCEDURE:  08/08/2006  DATE OF DISCHARGE:                               OPERATIVE REPORT   PREOPERATIVE DIAGNOSES:  1. Missed abortion at 13 weeks.  2. Insulin-dependent diabetes.   POSTOPERATIVE DIAGNOSES:  1. Missed abortion at 13 weeks.  2. Insulin-dependent diabetes.   PROCEDURE:  Dilation and evacuation.   SURGEON:  Naima A. Dillard, M.D.   ASSISTANT:  None.   ANESTHESIA:  General laryngeal mask airway.   SPECIMENS:  Products of conception.   ESTIMATED BLOOD LOSS:  Is about 300 mL.   COMPLICATIONS:  None.  The patient went to the PACU in stable condition.   DESCRIPTION OF PROCEDURE:  The patient was taken to the operating room  where she was given general anesthesia and placed in the dorsal  lithotomy position and prepped and draped in the normal sterile fashion.  A bivalve speculum was placed into the vagina.  The anterior lip of the  cervix was grasped with a single tooth tenaculum.  The cervix was  dilated with Pratt dilators, up to #29.  A size #10curette was placed  into the uterine cavity and large amounts of blood clots and blood were  seen in the catheter, then products of conception.  This was done until  a gritty texture was noted.  A curet was placed into the uterine cavity.  A sharp curettage until all four walls were noted to be gritty in  texture.  Because the patient has a large 9 cm anterior fibroid, I  called in an ultrasound, just to make sure that all contents were  evacuated out of the uterus.  The ultrasound demonstrated that all the  contents were evacuated.  Just put the suction in to draw off the blood.  The patient had brisk bleeding.  This was maintained with pressure and  Methergine.  All  instruments were removed from the vagina.  The sponge,  lap and  needle counts were correct.  The tenaculum site was hemostatic at the  end of the surgery and made hemostatic with silver nitrate.   The patient went to the recovery room in stable condition.      Naima A. Normand Sloop, M.D.  Electronically Signed     NAD/MEDQ  D:  08/08/2006  T:  08/08/2006  Job:  [id number]

## 2010-09-23 NOTE — H&P (Signed)
[name], [name]                            ACCOUNT NO.:  [id number]   MEDICAL RECORD NO.:  [id number]                   PATIENT TYPE:  EMS   LOCATION:  ED                                   FACILITY:  [facility]   PHYSICIAN:  Kingsley Callander. Ouida Sills, M.D.                  DATE OF BIRTH:  [birth year]   DATE OF ADMISSION:  10/04/2003  DATE OF DISCHARGE:                                HISTORY & PHYSICAL   CHIEF COMPLAINT:  Vomiting.   HISTORY OF PRESENT ILLNESS:  This patient is a 30 year old white female with  insulin-dependent diabetes since age 15 who presented to the emergency room  at 2301 with repeated episodes of vomiting since 10:30 a.m.  Evaluation in  the emergency room revealed findings consistent with diabetic ketoacidosis.  The patient denied any hematemesis, diarrhea, abdominal pain, fever or  chills.  She was begun on therapy in the emergency room with IV insulin, IV  fluids, and IV bicarb.  She has remained alert and hemodynamically stable.   PAST MEDICAL HISTORY:  1. Insulin-dependent diabetes managed by Dr. Chestine Spore in [place].  2. Intrauterine fetal demise in January 2005 at [redacted] weeks gestation.  3. Hospitalization at age 51 for diabetic coma.   MEDICATIONS:  1. Lantus 15 units h.s.  2. Novolog 4 units at breakfast, 8 units at lunch, 10 units at supper.   ALLERGIES:  None.   SOCIAL HISTORY:  She smokes a pack and a half of cigarettes per day.  She  denies alcohol use.  She works at [employer] in [employer].   FAMILY HISTORY:  Both parents are well, a half brother is well.  Both  grandmothers have diabetes.   REVIEW OF SYSTEMS:  She has had a nonproductive cough.  No fever or chills.  She has had some chest discomfort with coughing.  She denies abdominal pain.  She has had no difficulty voiding.  She wants to have her Foley catheter  removed.  Her last period was one week ago.   PHYSICAL EXAMINATION:  VITAL SIGNS:  Temperature 98.1, pulse 125, blood  pressure 143/74,  respirations 28.  GENERAL:  Alert, oriented young white female.  HEENT:  Eyes and oropharynx are unremarkable.  NECK:  Supple with no thyromegaly or JVD.  LUNGS:  Clear.  HEART:  Tachycardic with no murmurs or gallops.  ABDOMEN:  Soft, nontender with no hepatosplenomegaly.  GENITOURINARY:  No CVA tenderness.  EXTREMITIES:  Normal pulses.  No clubbing, cyanosis, or edema.  NEUROLOGICAL:  Normal strength, sensation, and speech.  LYMPH NODES:  No enlargement of the cervical or supraclavicular nodes.   LABORATORY DATA:  Chest x-ray reveals no infiltrate.  The urinalysis is  remarkable for ketones at 40 mg/dL and protein at 409 mg/dL, nitrite and  leukocyte esterase are negative.  Blood acetone is moderate.  ABG reveals a  pH of 7.1, PCO2  12, PO2 123, bicarb 3.5.  White count 30.5, hemoglobin 15.2,  platelets 448 with 76 segs, 16 lymphocytes.  Sodium 139, potassium 5.1,  bicarb 10, chloride 110, glucose 301, BUN 22, creatinine 1.7, calcium 8.6.  A drug screen was positive for tetrahydrocannabinol.  Urine pregnancy test  is negative.   IMPRESSION:  1. Diabetic ketoacidosis.  She is now acidotic and has been treated with     fluids, insulin, and bicarb.  A repeat pH is 7.048 with a bicarb of 6.4.     This was obtained prior to her receiving the bicarb.  A repeat potassium     was 5.6 and repeat glucose was 222.  She will be hospitalized in the     intensive care unit.  She will receive an IV insulin drip, IV normal     saline and will be followed with hourly Accu-Cheks and modifications of     her insulin drip as necessary.  We will repeat her ABG to follow her     acidosis.  She will be treated empirically with IV Rocephin.  There is no     obvious source of fever at this point and it would appear that she has an     underlying gastroenteritis to explain her vomiting.  Her Lantus will be     held for now.  2. Gastroenteritis.  3. Leukocytosis as above.      ___________________________________________                                         Kingsley Callander. Ouida Sills, M.D.   ROF/MEDQ  D:  10/05/2003  T:  10/05/2003  Job:  [id number]   cc:   Vania Rea, M.D.

## 2010-09-23 NOTE — H&P (Signed)
[name], [name]                  ACCOUNT NO.:  [id number]   MEDICAL RECORD NO.:  [id number]          PATIENT TYPE:  OBV   LOCATION:  9305                          [facility]   PHYSICIAN:  Naima A. Dillard, M.D. DATE OF BIRTH:  21-Oct-1980   DATE OF ADMISSION:  07/18/2006  DATE OF DISCHARGE:                              HISTORY & PHYSICAL   [name] is a 30 year old gravida 2, para 0-1-0-0 who presents at 89-  weeks' gestation with nausea and vomiting since last p.m. and diarrhea  this morning.  She had been on Phenergan p.o., and that was ineffective.  She was seen last at the office of [facility] last week with a normal  ultrasound.  She is not having any abdominal pain or cramping.  No  vaginal bleeding.  No temperature.  Her fasting blood sugar at home was  112.  Her CBG at approximately 9:00 was 157.  After 1 liter of IV fluids  with Phenergan, she is feeling a little better and will take her insulin  Humalog 4 units and eat breakfast.  Blood sugar will be checked 1 hour  postprandial, and if the patient is feeling better with no nausea or  vomiting, then plan was for discharge.  However, the patient was not  feeling better after 1 hour and still having nausea and vomiting.  Her  blood sugar at this time is 257.  She is therefore admitted for  observation per Dr. Normand Sloop to obtain stool specimens for ova and  parasites and culture.  The patient will continue her usual insulin  sliding scale dosage to cover CBCs.  She may have Zofran 6 mg IV q.6 h.  p.r.n. nausea.   This patient began prenatal care at the office of [facility] at approximately  6-weeks' gestation.  EDC determined by dates and confirmed with  ultrasound.  Her pregnancy has been complicated with a history of  diabetes, history of preterm stillbirth with her last pregnancy, and  nausea and vomiting.   The patient had a spontaneous vaginal delivery x1 at approximately 7  months gestation for a stillbirth infant.  This is  her second and  current pregnancy.   MEDICAL HISTORY:  Is significant for diabetes.   FAMILY HISTORY:  Is unremarkable   SOCIAL HISTORY:  [name] is a single Caucasian female.  Her mother  presents with the patient and is supportive.   The patient has no known drug allergies.   She smokes approximately one pack of cigarettes per day and denies the  use of alcohol or illicit drugs.   PHYSICAL EXAMINATION:  VITAL SIGNS:  Temperature 99.5, pulse 103,  respirations 20, blood pressure 128/76.  HEENT:  Is unremarkable.  HEART:  Regular rate and rhythm.  LUNGS:  Are clear.  ABDOMEN:  Is soft and nontender.  She has positive bowel sounds,  negative CVA tenderness bilaterally.  EXTREMITIES:  Show no pathologic edema.  DTRs are 1+ with no clonus.   ASSESSMENT:  Intrauterine pregnancy at 9 weeks, nausea and vomiting and  diabetes.   PLAN:  Admit  per Dr. Jaymes Graff with orders as written.      [name], C.N.M.      Naima A. Normand Sloop, M.D.  Electronically Signed    SDM/MEDQ  D:  07/18/2006  T:  07/19/2006  Job:  [id number]

## 2010-09-23 NOTE — H&P (Signed)
NAMEMADDELYN, ROCCA                            ACCOUNT NO.:  [id number]   MEDICAL RECORD NO.:  [id number]                   PATIENT TYPE:  INP   LOCATION:  9175                                 [facility]AC[facility]   PHYSICIAN:  Crist Fat. Rivard, M.D.              DATE OF BIRTH:  August 05, 1980   DATE OF ADMISSION:  05/18/2003  DATE OF DISCHARGE:                                HISTORY & PHYSICAL   [name] is a 30 year old gravida 1, para 0, at 81 weeks, who presented to  maternity admissions with a history of 24 hours of uncontrolled nausea and  vomiting since 5 a.m. yesterday.  On evaluation in maternity admissions she  was noted to have an intrauterine fetal demise.   She had presented to maternity admissions unit this morning at approximately  7:38 complaining of nausea and severe vomiting since 5 a.m. yesterday.  She  has not tolerated anything by mouth in 24 hours.  No chills.  Some abdominal  muscle soreness from vomiting.  She reports she felt the baby move up until  last night.  She used insulin NPH 34 units h.s. May 16, 2003, and NPH 28  units in the a.m. on January 9, but none since.  Dr. Estanislado Pandy was notified by  the staff for no fetal heart rate and did verify the information with  bedside ultrasound.   Pregnancy has been unremarkable for:  1. Class F diabetes, difficult to control.  2. Equivocal rubella.  3. Recent severe nausea and vomiting.    PRENATAL LABORATORY DATA:  Blood type is O positive, Rh antibody negative.  VDRL is nonreactive.  Toxoplasmosis titers were negative.  Negative rubella  titer is equivocal.  Hepatitis B surface antigen negative.  HIV nonreactive.  GC and Chlamydia cultures are negative.  Pap was normal.  The patient is a  juvenile-onset diabetic, so no Glucola was done.  Quadruple screen had to be  recalculated and then was found to be normal based upon a different  recalculation of her EDC.  Her hemoglobin A1C was 7.0, and that was at 11  weeks.   She also had demonstrated proteinuria in the past.  She has been  followed by Margaretmary Bayley, M.D.  Another 24-hour urine and hemoglobin A1C  were done at 13 weeks.  At that time it was 7.0 hemoglobin A1C and 275 mg of  protein in a 24-hour specimen.  Her fasting remained out of good control as  well as her two-hour postprandials.  She had a hemoglobin A1C on January 12, 2003, at 7.  Labs today:  CBG is 422.  CBC today:  Hemoglobin 15.8,  hematocrit 45.9, platelets 310, and white blood cell count of 28.2.  EDC of  July 06, 2003, was established by 11-week ultrasound.   HISTORY OF PRESENT PREGNANCY:  The patient had a new OB interview at  approximately six weeks.  She had an early ultrasound.  The patient had been  on insulin 70/30 3 units in the morning and 3 units in the evening.  That  was maintained initially, and was placed on fasting and two-hour  postprandials.  By eight weeks her sugars were still out of control.  She  was sent to nutritional management.  Her insulin at that time at eight weeks  was changed to 29 of N and 14 of R in the morning and 11 of N at h.s. and 11  of regular with dinner.  She was also placed on a sliding scale.  Her blood  sugars remained difficult to control, requiring frequent changes of her  insulin regimen.  Please see the prenatal record for full discussion of  these issues.  She then had her new OB exam full examination at 11 weeks.  She did not have an endocrinologist and was set up with Brooke Bonito, M.D.,  initially at Dr. Marylen Ponto office and was going to see Veverly Fells. Althe[place],  M.D.  The patient wished to change to Dr. Margaretmary Bayley.  She was placed on  Omnicef at 13 weeks for pharyngitis.  She had a fetal echocardiogram at  approximately 18 weeks.  This was found to be normal.  At 25 weeks she was  on NPH 28 units in the a.m. and 34 units at bedtime with a sliding scale  between. She had an ophthalmology consult in March that was normal.  She  had  an ultrasound at 29 weeks that showed growth at the 75th-90th percentile.  She began twice-weekly NSTs.  She also was noted to be smoking.  She was  interested in nicotine patch.  The Nicotrol patch was prescribed.  Her  hemoglobin A1C on November 29 was 6.1.  She had an ultrasound on May 11, 2003, at 31-6/7 weeks.  Estimated fetal weight was 4 pounds 1 ounce, at the  68th percentile, and net fluid volume was 11.5.  BPP was not done.   PAST OBSTETRICAL HISTORY:  Patient is a primigravida.   PAST MEDICAL HISTORY:  The patient was diagnosed with juvenile diabetes,  diagnosed at age 62.  She has had blood sugars that have required insulin  and have been difficult to control.  She was placed in the hospital when she  was diagnosed with diabetes and stayed in the hospital for six days.  She  was also hospitalized for a diabetic coma at age 77.  She has had one yeast  infection in the past.  She does have an indoor cat.   She has no known medication allergies.   She is a smoker.   Her only other surgery was wisdom teeth removed in 2003.   FAMILY HISTORY:  Maternal grandmother has hypertension.  Brother has anemia.  Maternal grandmother and paternal grandmother were diabetic.  Maternal  grandmother had questionable thyroid disease.  There is also a strong family  history of smoking.   GENETIC HISTORY:  Unremarkable.   SOCIAL HISTORY:  The patient is engaged.  Father of the baby is involved and  supportive.  His name is Orthoptist.  She is of the [employer].  She  has two years of college.  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  Her partner has  one year of additional school, and he is employed at a Database administrator.  She  has been followed by the physician service of [facility].  She  denies  any alcohol or drug use during this pregnancy.  She has been an  approximately one pack per day smoker.  PHYSICAL EXAMINATION:  VITAL SIGNS:  Blood pressure is 140/68, her   temperature is 97.7, heart rate 108, O2 saturation is 97-99%.  CBG was 422.  CBC:  Hemoglobin was 15.8, hematocrit 45.9, platelets 310, WBC was 28.2.  HEENT:  Within normal limits.  LUNGS:  Bilateral breath sounds are clear.  CARDIAC:  Regular rate and rhythm without murmur.  BREASTS:  Soft and nontender.  ABDOMEN:  Gravid and soft but nontender with a fundal height of 32 cm.  PELVIC:  Pelvic exam by Dr. Estanislado Pandy:  1 cm long, vertex at a -1 station.  EXTREMITIES:  Deep tendon reflexes are 2+ with one beat of clonus.   IMPRESSION:  1. Intrauterine pregnancy at 33 weeks with an intrauterine fetal demise.  2. Uncomplicated type 1 class F diabetes.   PLAN:  1. Admit to the [facility] per consult with Dr. Silverio Lay as attending physician.  2. Plan IV hydration.  3. Glucommander protocol.  4. IUFD lab work ordered.  5. Further management will be in consult with Dr. Estanislado Pandy based on the     patient's desires.  6. Support to the patient for loss.  7. Consider planned induction of labor after IV hydration and glucose     control are accomplished.     Renaldo Reel Emilee [name], C.N.M.                   Crist Fat Rivard, M.D.    Leeanne Mannan  D:  05/18/2003  T:  05/18/2003  Job:  [id number]

## 2010-09-23 NOTE — Discharge Summary (Signed)
[name], [name]                  ACCOUNT NO.:  [id number]   MEDICAL RECORD NO.:  [id number]          PATIENT TYPE:  INP   LOCATION:  A206                          FACILITY:  [facility]   PHYSICIAN:  Vania Rea, M.D. DATE OF BIRTH:  [birth year]   DATE OF ADMISSION:  02/28/2004  DATE OF DISCHARGE:  10/25/2005LH                                 DISCHARGE SUMMARY   PRIMARY CARE PHYSICIAN:  Unassigned.   ENDOCRINOLOGIST:  Margaretmary Bayley, M.D.   DISCHARGE DIAGNOSES:  1.  Diabetic ketoacidosis, resolved.  2.  Chronically uncontrolled diabetes.  3.  Noncompliance with medical advice.  4.  Upper respiratory infection.   DISPOSITION:  Discharged to home.   DISCHARGE CONDITION:  Stable.   DISCHARGE MEDICATIONS:  1.  Lantus 15 units at bedtime.  2.  Zithromax 500 mg daily for five days.  3.  Novolog insulin three times daily with meals on a sliding scale, 100-150      six units; 151-200 eight units; 201-250 ten units; 251-300 twelve units;      301-350 fourteen units; 351-400 sixteen units.   HOSPITAL COURSE:  This is a 30 year old Caucasian lady with diabetes type 1  controlled on Lantus and Novolog, who was admitted on the 23rd with nausea  and vomiting, found to be in diabetic ketoacidosis.  She was started on an  insulin drip and within 24 hours she settled but developed a leukocytosis.  She was found on examination that morning to be having purulent exudate in  her oropharynx.  This turned out to be negative for group betahemolytic  strep; nonetheless, the patient received an intravenous dose of Unasyn and  this morning the patient was feeling much better.  She is being discharged  home to complete the course of oral Zithromax.  This morning the patient is eating and has no complaints.  She is up and  walking in the corridors of the hospital.   PHYSICAL EXAMINATION:  VITAL SIGNS:  Her morning vitals temperature 98.4,  pulse 83, respirations 20, blood pressure 107/63.  Her fasting  capillary  blood sugar was 154.  CHEST:  Clear auscultation bilaterally.  CARDIOVASCULAR:  Regular rhythm.  ABDOMEN:  Soft and scaphoid.  Soft and nontender.  EXTREMITIES:  Without edema.   LABS:  Her white count is 10.8 down from 22.6 yesterday.  Her hemoglobin is  12.4, hematocrit 36.2.  Her platelets are 290.  Her absolute neutrophil  count is 6.3 down from 17.2 yesterday.  Her sodium is 137, potassium 4.4,  chloride 110, CO2 20, glucose 83, BUN 9, creatinine 0.8, calcium 8.0.  Her  hemoglobin A1c yesterday was 11.4.   FOLLOW UP:  The patient is to follow up with her endocrinologist, Dr.  Margaretmary Bayley, in one week.   SPECIAL INSTRUCTIONS:  1.  The patient has been counseled on the importance of discontinuing      smoking, especially in the presence of diabetes which is a coronary risk      factor.  2.  The patient has been advised on the importance of compliance with  medical advise.  She has admitted that she has not been checking her      blood sugars as regularly as she should be.  She has been informed of      the significance of a hemoglobin A1c of 11.4.  3.  The patient was advised to select a primary care physician in her area      to supplement her with Dr. Chestine Spore.     Leop   LC/MEDQ  D:  03/02/2004  T:  03/02/2004  Job:  [id number]   cc:   Margaretmary Bayley, M.D.  [address]  [place]  [place] [address]  Fax: [phone]

## 2010-09-23 NOTE — Discharge Summary (Signed)
[name], [name]                            ACCOUNT NO.:  [id number]   MEDICAL RECORD NO.:  [id number]                   PATIENT TYPE:  INP   LOCATION:  9318                                 FACILITY:  [facility]   PHYSICIAN:  [name], M.D.                DATE OF BIRTH:  October 20, 1980   DATE OF ADMISSION:  05/18/2003  DATE OF DISCHARGE:  05/22/2003                                 DISCHARGE SUMMARY   ADMISSION DIAGNOSES:  1. Intrauterine pregnancy at 58 weeks' gestation.  2. Intrauterine fetal demise.  3. Gastrointestinal virus.  4. Labile insulin-dependent diabetes.   DISCHARGE DIAGNOSES:  1. Intrauterine pregnancy at 82 weeks' gestation.  2. Intrauterine fetal demise.  3. Gastrointestinal virus.  4. Labile insulin-dependent diabetes.  5. Status post normal spontaneous vaginal delivery of a nonviable female     infant.  6. Stabilizing diabetes.  7. Appropriate grief process.   PROCEDURE:  This admission, spontaneous vaginal delivery of a nonviable  female infant, named [name], on May 20, 2003.  Attended by Dr. Osborn Thornton.   HOSPITAL COURSE:  [name] is a 30 year old married white female, gravida 1  para 0 at 93 weeks' gestation who presented complaining of severe nausea and  vomiting for the previous 24 hours and with a history of insulin-dependent  diabetes since age 53.  She was noted to have no fetal heart tones upon  admission and was documented to have an intrauterine fetal demise by  ultrasound subsequently.  She was admitted for stabilization of blood sugars  and induction of labor as the patient was able and underwent the same  starting on the morning of May 19, 2003.  She had Cytotec cervical  ripening done and progressed to delivery approximately 3 a.m. on May 20, 2003.  She delivered a nonviable female infant with no gross abnormalities  noted.  Please see delivery note for further details.   Postpartum, the patient has continued to do well.   She is grieving  appropriately and has good family and social support.  Her blood sugars have  stabilized, and she has remained afebrile with stable vital signs since  delivery.  She is currently undecided regarding contraception and is ready  for discharge today.  She has also been seen by Dr. Margaretmary Thornton who has  given her orders regarding her insulin regimen and plans to see her in  approximately three weeks.   DISCHARGE INSTRUCTIONS:  As per the [name]  handout, to  call for increased bleeding, fever or grief process for which she feels she  needs further discussion.   DISCHARGE MEDICATIONS:  1. Motrin 600 mg p.o. q.6h. p.r.n. for pain.  2. Tylox 1-2 p.o. q.4-6h. p.r.n. for pain.  3. Ambien 10 mg one tablet q.h.s. p.r.n. for sleeplessness.  4. Per Dr. Chestine Thornton, she needs 30 units of Lantus insulin subcu daily and  Novolog __________ to cover each meal and snacks based on 1 unit per 10     grams of carbohydrates and blood sugar level greater than 125.   She is to continue on an 1800 calorie carbohydrate diet.   She is to followup with Dr. Chestine Thornton in three weeks and with [name]  in approximately four weeks or p.r.n.   DISCHARGE LABS:  Her SGOT is 24, SGPT is less than 18.  Her fasting glucose,  today, was 54.  Her CBC:  Her hemoglobin is 11.7; her platelet count is 207,  WBC count is 9.6.     Concha Pyo. Duplantis, C.N.M.              [name], M.D.    SJD/MEDQ  D:  05/22/2003  T:  05/22/2003  Job:  [id number]

## 2010-09-23 NOTE — H&P (Signed)
[name], LYMAN                  ACCOUNT NO.:  [id number]   MEDICAL RECORD NO.:  [id number]          PATIENT TYPE:  AMB   LOCATION:  SDC                           FACILITY:  [facility]   PHYSICIAN:  Naima A. Dillard, M.D. DATE OF BIRTH:  01-15-1981   DATE OF ADMISSION:  DATE OF DISCHARGE:                              HISTORY & PHYSICAL   CHIEF COMPLAINT:  Missed AB in the first trimester.   HISTORY OF PRESENT ILLNESS:  The patient is a 30 year old gravida 2,  para 0-1-0-0 who presented today for her first trimester screening and  ultrasound.  It was noticed this fetus was macerated with severe  oligohydramnios and no fetal heart tones seen or able to be Dopplered.  The patient was given the news and told the options of expected  management versus a D and E, and has chosen a D and E.  Pregnancy has  been complicated by a threatened AB and also class F diabetic.   Her medications include 5 units of NovoLog with breakfast, 6 units of  NovoLog with lunch and 7 units of NovoLog with dinner and 19 units of  Lantus at night.  The patient has been watching her blood sugars closely  with the perinatologist at [facility].  Pregnancy has also been  complicated CIN II and III Pap smear.  Blood type is O positive and  Rubella titer is non-immune.   PAST OB HISTORY:  Significant for a vaginal delivery of an intrauterine  fetal demise at 32 weeks in January of 2005, and as above.   PAST MEDICAL HISTORY:  Patient was diagnosed with juvenile diabetes at  age 59 and has been a brittle diabetic since.   FAMILY HISTORY:  Maternal grandmother has hypertension.  Paternal  grandmother is diabetic.   GENETIC HISTORY:  Unremarkable.   SOCIAL HISTORY:  The patient is married.  Father of the baby is  involved.  She is a tobacco user.   PHYSICAL EXAMINATION:  VITAL SIGNS:  Blood pressure is 120/80.  Patient  weighs 148 pounds.  GENERAL:  She is in emotional distress secondary to the fetal loss but  no apparent acute distress.  HEART:  Regular rate and rhythm.  SKIN:  Normal.  LUNGS:  Clear to auscultation bilaterally.  ABDOMEN:  Soft and nontender.  EXTREMITIES:  No cyanosis, clubbing or edema.  GENITOURINARY:  Exam will be saved for the OR.   LABORATORY DATA:  She had an ultrasound which measured her at 13 weeks  with breech presentation, no fetal heart tones.   ASSESSMENT:  Missed abortion in the first trimester.   PLAN:  D and E (dilatation and evacuation).  The patient understands the  risk are not limited to bleeding, infection, damage to internal organs  such as bowel, bladder, major blood loss or perforation of the uterus.  The D and E will be done under ultrasound guidance.      Naima A. Normand Sloop, M.D.  Electronically Signed     NAD/MEDQ  D:  08/07/2006  T:  08/07/2006  Job:  [id number]

## 2010-09-23 NOTE — Group Therapy Note (Signed)
[name], [name]                  ACCOUNT NO.:  [id number]   MEDICAL RECORD NO.:  [id number]          PATIENT TYPE:  INP   LOCATION:  A206                          FACILITY:  [facility]   PHYSICIAN:  Vania Rea, M.D. DATE OF BIRTH:  [birth year]   DATE OF PROCEDURE:  DATE OF DISCHARGE:                                   PROGRESS NOTE   Hospital day #2.   SUBJECTIVE:  The patient says she feels fine.  She wants to go home.   OBJECTIVE:  Vitals:  Temperature 98.7, pulse 98, respirations 18, blood  pressure 116/58.  Her capillary blood sugars have ranged between 135 and  264, 282.  Pupils are round and equal.  Mucous membranes are dry.  Throat:  She has a grayish-yellow membrane over her oropharynx.  Her chest is clear  to auscultation bilaterally.  Cardiovascular system:  Regular rhythm.  Her  abdomen is soft and nontender.  Her extremities are without edema.   LABORATORY DATA:  Her white count has jumped to 22.6 thousand.  Her  hemoglobin is 13, platelets 360.  Absolute neutrophil count has increased to  17.2.  Her sodium is 135, potassium 4.3, chloride 113, CO2 9, glucose 141,  BUN 11, creatinine 1.1, calcium 8.1.  Hemoglobin A1C is 11.4.   ASSESSMENT:  1.  Diabetic ketoacidosis, resolving.  2. Chronic uncontrolled diabetes.  3.      Acute pharyngitis.   PLAN:  Will start sublingual administration of insulin and also start an  oral diet.  Will continue insulin drip for now.  Will get a beta rapid strep  antigen.  If negative, will start Zithromax.  If positive, will start  penicillin.     Leop   LC/MEDQ  D:  02/29/2004  T:  02/29/2004  Job:  [id number]

## 2010-09-23 NOTE — Discharge Summary (Signed)
[name], [name]                            ACCOUNT NO.:  [id number]   MEDICAL RECORD NO.:  [id number]                   PATIENT TYPE:  INP   LOCATION:  A326                                 FACILITY:  [facility]   PHYSICIAN:  Hanley Hays. Dechurch, M.D.           DATE OF BIRTH:  1981/02/23   DATE OF ADMISSION:  10/04/2003  DATE OF DISCHARGE:  10/06/2003                                 DISCHARGE SUMMARY   DISCHARGE DIAGNOSES:  1. Diabetic ketoacidosis.  2. Type 1 diabetes mellitus.  3. Probable viral gastroenteritis.  4. Tobacco abuse.  5. Weight loss, probable depression, postpartum status post miscarriage at     33 weeks.   HOSPITAL COURSE:  A pleasant 30 year old Caucasian female with diabetes  mellitus since age 1 who had been doing well until the day prior to  admission when she developed nausea and vomiting, low grade fever and  inability to maintain p.o.'s.  Despite taking her insulin her glucose was  uncontrolled.  She presented to the emergency room where was noted to be in  frank diabetic ketoacidosis.  She received IV fluids and IV insulin drip.  Her glucose was controlled.  Once the drip was stopped, her glucoses were  not as well controlled, however she noted over the proceeding week or so her  glucoses, she was placed back on her maintenance insulin dose.  She was  tolerating p.o.'s.  She felt quite well.  Her CO2 had returned up to 14 from  8.  She had no other signs or symptoms.  It was felt that she could be  managed at home with outpatient therapy.  She was not on any other  medications with the exception of her Lantus of 15 units q.h.s. and  corrective dose insulin preprandially, which she was well versed in.  She  has not been on any other therapies, including aspirin, ACE inhibitors or  statin medications.  She was advised to speak with her regular physician  about this upon discharge.  She was also given my contact number should  there be any questions or problems  over the next 24-48 hours.  If the nausea  and vomiting returns she was instructed to return to the emergency room.  She had good understanding.  She was also counseled on smoking cessation.  She is stable at the time of discharge, with a return to work on Friday,  June 3.     ___________________________________________                                         Hanley Hays. Josefine Class, M.D.   FED/MEDQ  D:  10/06/2003  T:  10/06/2003  Job:  [id number]   cc:   Margaretmary Bayley, M.D.  [address]  [place]  [place] [address]  Fax:  [phone] 

## 2011-02-02 LAB — BASIC METABOLIC PANEL
BUN: 3 — ABNORMAL LOW
Chloride: 103
Glucose, Bld: 268 — ABNORMAL HIGH
Potassium: 4.1

## 2011-02-02 LAB — CBC
HCT: 32.7 — ABNORMAL LOW
HCT: 39.6
MCHC: 32.3
MCV: 78.3
Platelets: 215
Platelets: 251
RDW: 20.4 — ABNORMAL HIGH
WBC: 8.9

## 2011-02-20 LAB — BASIC METABOLIC PANEL
BUN: 12
Chloride: 104
GFR calc non Af Amer: >60
Potassium: 3.4 — ABNORMAL LOW
Sodium: 137

## 2011-02-20 LAB — CBC
MCV: 85.2
Platelets: 251
RBC: 5.35 — ABNORMAL HIGH
WBC: 11 — ABNORMAL HIGH

## 2011-02-20 LAB — HCG, QUANTITATIVE, PREGNANCY: hCG, Beta Chain, Quant, S: <2

## 2011-07-27 ENCOUNTER — Ambulatory Visit: Payer: Self-pay | Admitting: Physical Therapy

## 2014-07-30 ENCOUNTER — Encounter (HOSPITAL_COMMUNITY): Payer: Self-pay | Admitting: Emergency Medicine

## 2014-07-30 ENCOUNTER — Other Ambulatory Visit (HOSPITAL_COMMUNITY): Payer: Self-pay

## 2014-07-30 ENCOUNTER — Emergency Department (HOSPITAL_COMMUNITY): Payer: BLUE CROSS/BLUE SHIELD

## 2014-07-30 ENCOUNTER — Inpatient Hospital Stay (HOSPITAL_COMMUNITY)
Admission: EM | Admit: 2014-07-30 | Discharge: 2014-08-01 | DRG: 638 | Disposition: A | Discharge status: Home / Self Care | Payer: BLUE CROSS/BLUE SHIELD | Attending: Internal Medicine | Admitting: Internal Medicine

## 2014-07-30 DIAGNOSIS — E876 Hypokalemia: Secondary | ICD-10-CM | POA: Diagnosis present

## 2014-07-30 DIAGNOSIS — Z794 Long term (current) use of insulin: Secondary | ICD-10-CM | POA: Diagnosis not present

## 2014-07-30 DIAGNOSIS — E1065 Type 1 diabetes mellitus with hyperglycemia: Secondary | ICD-10-CM | POA: Diagnosis present

## 2014-07-30 DIAGNOSIS — E1010 Type 1 diabetes mellitus with ketoacidosis without coma: Secondary | ICD-10-CM | POA: Diagnosis not present

## 2014-07-30 DIAGNOSIS — D509 Iron deficiency anemia, unspecified: Secondary | ICD-10-CM | POA: Diagnosis present

## 2014-07-30 DIAGNOSIS — Z720 Tobacco use: Secondary | ICD-10-CM | POA: Diagnosis not present

## 2014-07-30 DIAGNOSIS — E860 Dehydration: Secondary | ICD-10-CM | POA: Diagnosis present

## 2014-07-30 DIAGNOSIS — Z9119 Patient's noncompliance with other medical treatment and regimen: Secondary | ICD-10-CM | POA: Diagnosis present

## 2014-07-30 DIAGNOSIS — F17210 Nicotine dependence, cigarettes, uncomplicated: Secondary | ICD-10-CM | POA: Diagnosis present

## 2014-07-30 DIAGNOSIS — D649 Anemia, unspecified: Secondary | ICD-10-CM | POA: Diagnosis present

## 2014-07-30 DIAGNOSIS — Z9641 Presence of insulin pump (external) (internal): Secondary | ICD-10-CM | POA: Diagnosis present

## 2014-07-30 DIAGNOSIS — R112 Nausea with vomiting, unspecified: Secondary | ICD-10-CM | POA: Diagnosis not present

## 2014-07-30 DIAGNOSIS — D72829 Elevated white blood cell count, unspecified: Secondary | ICD-10-CM | POA: Diagnosis present

## 2014-07-30 DIAGNOSIS — N179 Acute kidney failure, unspecified: Secondary | ICD-10-CM | POA: Diagnosis present

## 2014-07-30 DIAGNOSIS — E875 Hyperkalemia: Secondary | ICD-10-CM | POA: Diagnosis present

## 2014-07-30 DIAGNOSIS — IMO0002 Reserved for concepts with insufficient information to code with codable children: Secondary | ICD-10-CM | POA: Diagnosis present

## 2014-07-30 HISTORY — DX: Type 2 diabetes mellitus without complications: E11.9

## 2014-07-30 LAB — BASIC METABOLIC PANEL
ANION GAP: 17 — AB (ref 5–15)
Anion gap: 21 — ABNORMAL HIGH (ref 5–15)
BUN: 43 mg/dL — AB (ref 6–23)
BUN: 36 mg/dL — ABNORMAL HIGH (ref 6–23)
BUN: 33 mg/dL — ABNORMAL HIGH (ref 6–23)
CALCIUM: 8.9 mg/dL (ref 8.4–10.5)
CHLORIDE: 105 mmol/L (ref 96–112)
CHLORIDE: 118 mmol/L — AB (ref 96–112)
CO2: <5 mmol/L — CL (ref 19–32)
CO2: 6 mmol/L — AB (ref 19–32)
CO2: 10 mmol/L — CL (ref 19–32)
CREATININE: 1.77 mg/dL — AB (ref 0.50–1.10)
CREATININE: 1.56 mg/dL — AB (ref 0.50–1.10)
Calcium: 8.4 mg/dL (ref 8.4–10.5)
Calcium: 8.3 mg/dL — ABNORMAL LOW (ref 8.4–10.5)
Chloride: 118 mmol/L — ABNORMAL HIGH (ref 96–112)
Creatinine, Ser: 1.38 mg/dL — ABNORMAL HIGH (ref 0.50–1.10)
GFR calc Af Amer: 57 mL/min — ABNORMAL LOW (ref >90)
GFR calc non Af Amer: 36 mL/min — ABNORMAL LOW (ref >90)
GFR calc non Af Amer: 42 mL/min — ABNORMAL LOW (ref >90)
GFR calc non Af Amer: 49 mL/min — ABNORMAL LOW (ref >90)
GFR, EST AFRICAN AMERICAN: 42 mL/min — AB (ref >90)
GFR, EST AFRICAN AMERICAN: 49 mL/min — AB (ref >90)
GLUCOSE: 287 mg/dL — AB (ref 70–99)
Glucose, Bld: 667 mg/dL (ref 70–99)
Glucose, Bld: 198 mg/dL — ABNORMAL HIGH (ref 70–99)
Potassium: 6.4 mmol/L (ref 3.5–5.1)
Potassium: 4.9 mmol/L (ref 3.5–5.1)
Potassium: 4.3 mmol/L (ref 3.5–5.1)
SODIUM: 145 mmol/L (ref 135–145)
Sodium: 137 mmol/L (ref 135–145)
Sodium: 145 mmol/L (ref 135–145)

## 2014-07-30 LAB — CBC WITH DIFFERENTIAL/PLATELET
BASOS ABS: 0.3 10*3/uL — AB (ref 0.0–0.1)
Basophils Relative: 1 % (ref 0–1)
EOS PCT: 0 % (ref 0–5)
Eosinophils Absolute: 0 10*3/uL (ref 0.0–0.7)
HCT: 36.4 % (ref 36.0–46.0)
Hemoglobin: 10.3 g/dL — ABNORMAL LOW (ref 12.0–15.0)
LYMPHS ABS: 3.4 10*3/uL (ref 0.7–4.0)
Lymphocytes Relative: 13 % (ref 12–46)
MCH: 20.8 pg — AB (ref 26.0–34.0)
MCHC: 28.3 g/dL — ABNORMAL LOW (ref 30.0–36.0)
MCV: 73.5 fL — AB (ref 78.0–100.0)
MONOS PCT: 5 % (ref 3–12)
Monocytes Absolute: 1.3 10*3/uL — ABNORMAL HIGH (ref 0.1–1.0)
NEUTROS ABS: 20.8 10*3/uL — AB (ref 1.7–7.7)
Neutrophils Relative %: 81 % — ABNORMAL HIGH (ref 43–77)
PLATELETS: 398 10*3/uL (ref 150–400)
RBC: 4.95 MIL/uL (ref 3.87–5.11)
RDW: 16.6 % — ABNORMAL HIGH (ref 11.5–15.5)
WBC: 25.8 10*3/uL — ABNORMAL HIGH (ref 4.0–10.5)

## 2014-07-30 LAB — URINALYSIS, ROUTINE W REFLEX MICROSCOPIC
Bilirubin Urine: NEGATIVE
Glucose, UA: >1000 mg/dL — AB
LEUKOCYTES UA: NEGATIVE
NITRITE: NEGATIVE
PH: 5.5 (ref 5.0–8.0)
PROTEIN: 30 mg/dL — AB
Specific Gravity, Urine: 1.022 (ref 1.005–1.030)
Urobilinogen, UA: 1 mg/dL (ref 0.0–1.0)

## 2014-07-30 LAB — BLOOD GAS, VENOUS
ACID-BASE DEFICIT: 27.3 mmol/L — AB (ref 0.0–2.0)
BICARBONATE: 3.2 meq/L — AB (ref 20.0–24.0)
FIO2: 0.21 %
O2 Saturation: 93 %
PCO2 VEN: 13.9 mmHg — AB (ref 45.0–50.0)
PO2 VEN: 106 mmHg — AB (ref 30.0–45.0)
Patient temperature: 98.6
TCO2: 3.3 mmol/L (ref 0–100)
pH, Ven: 6.993 — CL (ref 7.250–7.300)

## 2014-07-30 LAB — CBC
HCT: 34.4 % — ABNORMAL LOW (ref 36.0–46.0)
Hemoglobin: 9.8 g/dL — ABNORMAL LOW (ref 12.0–15.0)
MCH: 20.8 pg — ABNORMAL LOW (ref 26.0–34.0)
MCHC: 28.5 g/dL — AB (ref 30.0–36.0)
MCV: 72.9 fL — ABNORMAL LOW (ref 78.0–100.0)
PLATELETS: 340 10*3/uL (ref 150–400)
RBC: 4.72 MIL/uL (ref 3.87–5.11)
RDW: 16.3 % — ABNORMAL HIGH (ref 11.5–15.5)
WBC: 27.8 10*3/uL — AB (ref 4.0–10.5)

## 2014-07-30 LAB — GLUCOSE, CAPILLARY
GLUCOSE-CAPILLARY: 123 mg/dL — AB (ref 70–99)
GLUCOSE-CAPILLARY: 144 mg/dL — AB (ref 70–99)
GLUCOSE-CAPILLARY: 347 mg/dL — AB (ref 70–99)
GLUCOSE-CAPILLARY: 358 mg/dL — AB (ref 70–99)
Glucose-Capillary: 257 mg/dL — ABNORMAL HIGH (ref 70–99)
Glucose-Capillary: 223 mg/dL — ABNORMAL HIGH (ref 70–99)
Glucose-Capillary: 174 mg/dL — ABNORMAL HIGH (ref 70–99)

## 2014-07-30 LAB — CBG MONITORING, ED
GLUCOSE-CAPILLARY: 417 mg/dL — AB (ref 70–99)
Glucose-Capillary: 573 mg/dL (ref 70–99)
Glucose-Capillary: 590 mg/dL (ref 70–99)

## 2014-07-30 LAB — MRSA PCR SCREENING: MRSA by PCR: NEGATIVE

## 2014-07-30 LAB — URINE MICROSCOPIC-ADD ON

## 2014-07-30 LAB — HCG, QUANTITATIVE, PREGNANCY: hCG, Beta Chain, Quant, S: <1 m[IU]/mL (ref <5)

## 2014-07-30 MED ORDER — SODIUM CHLORIDE 0.9 % IV BOLUS (SEPSIS)
1000.0000 mL | Freq: Once | INTRAVENOUS | Status: AC
  Administered 2014-07-30: 1000 mL via INTRAVENOUS

## 2014-07-30 MED ORDER — SODIUM BICARBONATE 8.4 % IV SOLN
INTRAVENOUS | Status: DC
  Filled 2014-07-30: qty 1000

## 2014-07-30 MED ORDER — ONDANSETRON HCL 4 MG/2ML IJ SOLN
4.0000 mg | INTRAMUSCULAR | Status: DC | PRN
  Administered 2014-07-30 – 2014-07-31 (×4): 4 mg via INTRAVENOUS
  Filled 2014-07-30 (×4): qty 2

## 2014-07-30 MED ORDER — SODIUM CHLORIDE 0.9 % IV SOLN
INTRAVENOUS | Status: DC
  Administered 2014-07-30: 10.6 [IU]/h via INTRAVENOUS
  Administered 2014-07-30: 5.1 [IU]/h via INTRAVENOUS
  Administered 2014-07-30: 8.9 [IU]/h via INTRAVENOUS
  Administered 2014-07-30: 7.1 [IU]/h via INTRAVENOUS
  Administered 2014-07-30: 5.4 [IU]/h via INTRAVENOUS
  Administered 2014-07-30: 11.5 [IU]/h via INTRAVENOUS
  Filled 2014-07-30: qty 2.5

## 2014-07-30 MED ORDER — MORPHINE SULFATE 4 MG/ML IJ SOLN
4.0000 mg | Freq: Once | INTRAMUSCULAR | Status: AC
  Administered 2014-07-30: 4 mg via INTRAVENOUS
  Filled 2014-07-30: qty 1

## 2014-07-30 MED ORDER — ONDANSETRON HCL 4 MG/2ML IJ SOLN
4.0000 mg | Freq: Once | INTRAMUSCULAR | Status: AC
  Administered 2014-07-30: 4 mg via INTRAVENOUS
  Filled 2014-07-30: qty 2

## 2014-07-30 MED ORDER — ONDANSETRON HCL 4 MG/2ML IJ SOLN
4.0000 mg | Freq: Three times a day (TID) | INTRAMUSCULAR | Status: DC | PRN
Start: 2014-07-30 — End: 2014-07-30

## 2014-07-30 MED ORDER — LORAZEPAM 2 MG/ML IJ SOLN
1.0000 mg | Freq: Once | INTRAMUSCULAR | Status: AC
  Administered 2014-07-30: 1 mg via INTRAVENOUS
  Filled 2014-07-30: qty 1

## 2014-07-30 MED ORDER — SODIUM CHLORIDE 0.9 % IV BOLUS (SEPSIS)
2000.0000 mL | Freq: Once | INTRAVENOUS | Status: AC
  Administered 2014-07-30: 2000 mL via INTRAVENOUS

## 2014-07-30 MED ORDER — HEPARIN SODIUM (PORCINE) 5000 UNIT/ML IJ SOLN
5000.0000 [IU] | Freq: Three times a day (TID) | INTRAMUSCULAR | Status: DC
  Administered 2014-07-30 – 2014-08-01 (×5): 5000 [IU] via SUBCUTANEOUS
  Filled 2014-07-30 (×10): qty 1

## 2014-07-30 MED ORDER — STERILE WATER FOR INJECTION IV SOLN
INTRAVENOUS | Status: DC
  Administered 2014-07-30 – 2014-07-31 (×2): via INTRAVENOUS
  Filled 2014-07-30 (×4): qty 850

## 2014-07-30 MED ORDER — PROMETHAZINE HCL 25 MG/ML IJ SOLN
12.5000 mg | Freq: Four times a day (QID) | INTRAMUSCULAR | Status: AC | PRN
  Administered 2014-07-30 – 2014-07-31 (×2): 12.5 mg via INTRAVENOUS
  Filled 2014-07-30 (×3): qty 1

## 2014-07-30 MED ORDER — NICOTINE 21 MG/24HR TD PT24
21.0000 mg | MEDICATED_PATCH | Freq: Every day | TRANSDERMAL | Status: DC
Start: 2014-07-30 — End: 2014-08-01
  Administered 2014-07-31 – 2014-08-01 (×2): 21 mg via TRANSDERMAL
  Filled 2014-07-30 (×3): qty 1

## 2014-07-30 MED ORDER — SODIUM CHLORIDE 0.9 % IV SOLN: INTRAVENOUS | Status: DC

## 2014-07-30 MED ORDER — DEXTROSE-NACL 5-0.45 % IV SOLN
INTRAVENOUS | Status: DC
  Administered 2014-07-31: 05:00:00 via INTRAVENOUS
  Administered 2014-07-31: 1000 mL via INTRAVENOUS

## 2014-07-30 MED ORDER — SODIUM CHLORIDE 0.9 % IV SOLN
1.0000 g | Freq: Once | INTRAVENOUS | Status: AC
  Administered 2014-07-30: 1 g via INTRAVENOUS
  Filled 2014-07-30: qty 10

## 2014-07-30 MED ORDER — HYDROMORPHONE HCL 1 MG/ML IJ SOLN: 0.5000 mg | INTRAMUSCULAR | Status: AC | PRN

## 2014-07-30 MED ORDER — DEXTROSE-NACL 5-0.45 % IV SOLN: INTRAVENOUS | Status: DC

## 2014-07-30 NOTE — ED Provider Notes (Signed)
CSN: [id number]     Arrival date & time 07/30/14  1151 History   First MD Initiated Contact with Patient 07/30/14 1213     Chief Complaint  Patient presents with  . Emesis  . Hyperglycemia      HPI Patient presents with nausea vomiting and diarrhea over the past 48 hours.  She's a type I diabetic reports that last night her blood sugar began reading high.  Today she has felt worsening nausea vomiting is continued.  He reports decreased oral intake.  She states every time she gets a GI illness and seems to push her into diabetic ketoacidosis.  She denies dysuria urinary frequency.  She does report cough without shortness of breath.  She denies abdominal pain.  She reports generalized back pain and back aches without injury or trauma.  Pain is moderate in severity.  She states that she feels awful at this time.   Past Medical History  Diagnosis Date  . Diabetes mellitus without complication    Past Surgical History  Procedure Laterality Date  . Uterine fibroid surgery     No family history on file. History  Substance Use Topics  . Smoking status: Current Every Day Smoker    Types: Cigarettes  . Smokeless tobacco: Not on file  . Alcohol Use: No   OB History    No data available     Review of Systems  All other systems reviewed and are negative.     Allergies  Review of patient's allergies indicates no known allergies.  Home Medications   Prior to Admission medications   Medication Sig Start Date End Date Taking? Authorizing Provider  Insulin Human (INSULIN PUMP) SOLN Inject into the skin. 0.5 units/hr   Yes Historical Provider, MD  naproxen sodium (ANAPROX) 220 MG tablet Take 440 mg by mouth daily as needed (pain).   Yes Historical Provider, MD   BP 141/49 mmHg  Pulse 113  Temp(Src) 97.9 F (36.6 C) (Oral)  Resp 21  SpO2 100%  LMP 07/20/2014 Physical Exam  Constitutional: She is oriented to person, place, and time. She appears well-developed and well-nourished.  No distress.  HENT:  Head: Normocephalic and atraumatic.  Because membranes dry  Eyes: EOM are normal.  Neck: Normal range of motion.  Cardiovascular: Regular rhythm and normal heart sounds.   tachycardia  Pulmonary/Chest: Effort normal and breath sounds normal.  tachypnea  Abdominal: Soft. She exhibits no distension. There is no tenderness.  Musculoskeletal: Normal range of motion.  Neurological: She is alert and oriented to person, place, and time.  Skin: Skin is warm and dry.  Psychiatric: She has a normal mood and affect. Judgment normal.  Nursing note and vitals reviewed.   ED Course  Procedures (including critical care time)  CRITICAL CARE Performed by: Lyanne Co Total critical care time: 32 Critical care time was exclusive of separately billable procedures and treating other patients. Critical care was necessary to treat or prevent imminent or life-threatening deterioration. Critical care was time spent personally by me on the following activities: development of treatment plan with patient and/or surrogate as well as nursing, discussions with consultants, evaluation of patient's response to treatment, examination of patient, obtaining history from patient or surrogate, ordering and performing treatments and interventions, ordering and review of laboratory studies, ordering and review of radiographic studies, pulse oximetry and re-evaluation of patient's condition.   Labs Review Labs Reviewed  CBC WITH DIFFERENTIAL/PLATELET - Abnormal; Notable for the following:    WBC 25.8 (*)  Hemoglobin 10.3 (*)    MCV 73.5 (*)    MCH 20.8 (*)    MCHC 28.3 (*)    RDW 16.6 (*)    All other components within normal limits  BASIC METABOLIC PANEL - Abnormal; Notable for the following:    Potassium 6.4 (*)    CO2 <5 (*)    Glucose, Bld 667 (*)    BUN 43 (*)    Creatinine, Ser 1.77 (*)    GFR calc non Af Amer 36 (*)    GFR calc Af Amer 42 (*)    All other components within  normal limits  URINALYSIS, ROUTINE W REFLEX MICROSCOPIC - Abnormal; Notable for the following:    APPearance CLOUDY (*)    Glucose, UA >1000 (*)    Hgb urine dipstick LARGE (*)    Ketones, ur >80 (*)    Protein, ur 30 (*)    All other components within normal limits  BLOOD GAS, VENOUS - Abnormal; Notable for the following:    pH, Ven 6.993 (*)    pCO2, Ven 13.9 (*)    pO2, Ven 106.0 (*)    Bicarbonate 3.2 (*)    Acid-base deficit 27.3 (*)    All other components within normal limits  URINE MICROSCOPIC-ADD ON - Abnormal; Notable for the following:    Squamous Epithelial / LPF FEW (*)    Bacteria, UA MANY (*)    All other components within normal limits  CBG MONITORING, ED - Abnormal; Notable for the following:    Glucose-Capillary 573 (*)    All other components within normal limits  CBG MONITORING, ED - Abnormal; Notable for the following:    Glucose-Capillary >600 (*)    All other components within normal limits  CBG MONITORING, ED - Abnormal; Notable for the following:    Glucose-Capillary 590 (*)    All other components within normal limits  HCG, QUANTITATIVE, PREGNANCY   BUN  Date Value Ref Range Status  07/30/2014 43* 6 - 23 mg/dL Final  66/44/0347 2* 6 - 23 mg/dL Final  42/59/5638 2* 6 - 23 mg/dL Final  75/64/3329 4* 6 - 23 mg/dL Final   CREATININE, SER  Date Value Ref Range Status  07/30/2014 1.77* 0.50 - 1.10 mg/dL Final  51/88/4166 0.63 0.4 - 1.2 mg/dL Final  01/60/1093 2.35 0.4 - 1.2 mg/dL Final  57/32/2025 4.27 0.4 - 1.2 mg/dL Final       Imaging Review Dg Chest 2 View  07/30/2014   CLINICAL DATA:  Nausea and vomiting; mild difficulty breathing  EXAM: CHEST  2 VIEW  COMPARISON:  December 08, 2009  FINDINGS: Lungs are clear. Heart size and pulmonary vascularity are normal. No adenopathy. No bone lesions.  IMPRESSION: No edema or consolidation.   Electronically Signed   By: Bretta Bang III M.D.   On: 07/30/2014 13:27  I personally reviewed the imaging  tests through PACS system I reviewed available ER/hospitalization records through the EMR    EKG Interpretation None      MDM   Final diagnoses:  Diabetic ketoacidosis without coma associated with type 1 diabetes mellitus    Patient with severe DKA.  Patient kept her insulin pump on until we initiated the IV insulin at which point I asked the patient to discontinue her insulin pump.  This appears to be severe DKA.  She will need to be admitted to the stepdown unit.  Leukocytosis noted.  Chest x-ray clear.  Urinalysis without signs of infection.  Patient reports some nonfocal back pain at this time.    Azalia BilisKevin Soloman Mckeithan, MD 07/30/14 1556

## 2014-07-30 NOTE — H&P (Signed)
[facility] History and Physical  [name] ZOX:[id number] DOB: 03-19-1981 DOA: 07/30/2014  Referring physician: Azalia Bilis, MD  PCP: Ceasar Mons, MD in [place] , [place]  Chief Complaint:  Nausea and vomiting x 1 day  HPI:  34 year old female with history of type 1 diabetes mellitus since age  of 48 with frequent hospitalizations for DKA (last hospitalized in 2012 ), currently on insulin pump with poor outpatient follow up and monitoring was brought to the ED by her family with one day history of nausea and vomiting. Patient reports waking up at 1 in the morning yesterday with several episodes of nausea and brownish liquid. She reports a total of 7-8 episodes of vomiting and one episode of watery diarrhea last night. Last episode of vomiting was just before arriving to the ED. She denies any sick contacts, recent illness, eating anything outside or recent travel. Patient sleepy during conversation but easily arousable. She denies monitoring her blood glucose and saw her PCP several months back. Reports polyuria but no polydipsia. Patient denies headache, dizziness, fever, chills,chest pain, palpitations, SOB, abdominal pain, . Denies loss of weight.  Course in the ED fingersticks were > 600 Patient was afebrile. She was tachycardic to 128, normal blood pressure, respiratory rate and O2 sat. Blood work showed significant leukocytosis of 25.8k, and 10.3. Chemistry showed sodium of 137, K of 6.4, chloride of 105, CO2 < 5 ( AG >17), BUN of 43 and creatinine 1.77. Blood glucose was 667. EKG showed sinus tachycardia at 120 with peaked T waves in V4 and V5. Venous blood gas done showed pH of 6.99, bicarbonate of 3.2. UA showed significant ketones and many bacteria. Patient ordered for 3 L IV normal saline bolus in the ED (had received 2 L already) . IV insulin drip initiated in the ED and hospitalists consulted for admission to stepdown. Her severe DKA , pt admitted to ICU.  Review of  Systems:  Constitutional: Denies fever, chills, diaphoresis, appetite change , reports fatigue HEENT: Denies visual or hearing symptoms, difficulty swallowing, cough, congestion, neck pain  Respiratory: Denies SOB, DOE, cough, chest tightness,  and wheezing.   Cardiovascular: Denies chest pain, palpitations and leg swelling.  Gastrointestinal: nausea, vomiting, abdominal pain, diarrhea, denies constipation, blood in stool and abdominal distention.  Genitourinary: Denies dysuria, urgency, frequency, hematuria, flank pain and difficulty urinating.  Endocrine: polyuria ++, Denies: hot or cold intolerance, sweats, changes in hair or nails,  polydipsia. Musculoskeletal: Denies myalgias, back pain, joint pain or swelling Skin: Denies rash and wound.  Neurological: Denies dizziness,  syncope, weakness, light-headedness, numbness and headaches.  Hematological: Denies adenopathy.  Psychiatric/Behavioral: Denies confusion  Past Medical History  Diagnosis Date  . Diabetes mellitus without complication    Past Surgical History  Procedure Laterality Date  . Uterine fibroid surgery     Social History:  reports that she has been smoking Cigarettes.  She does not have any smokeless tobacco history on file. She reports that she does not drink alcohol. Her drug history is not on file.  No Known Allergies  No family history on file.  Prior to Admission medications   Medication Sig Start Date End Date Taking? Authorizing Provider  Insulin Human (INSULIN PUMP) SOLN Inject into the skin. 0.5 units/hr   Yes Historical Provider, MD  naproxen sodium (ANAPROX) 220 MG tablet Take 440 mg by mouth daily as needed (pain).   Yes Historical Provider, MD     Physical Exam:  Filed Vitals:   07/30/14 1201  07/30/14 1202 07/30/14 1520  BP:  141/49 144/69  Pulse:  113 128  Temp:  97.9 F (36.6 C)   TempSrc:  Oral   Resp:  21 22  SpO2: 100% 100% 99%    Constitutional: Vital signs reviewed.  Middle aged thin  built female lying in bed sleepy but arousable. Shallow respiration with kussmaul breathing  HEENT: no pallor, no icterus, dry oral mucosa+, no cervical lymphadenopathy Cardiovascular: S1 and S2 tachycardic, no murmurs rub or gallop Chest: shallow CTAB, no wheezes, rales, or rhonchi Abdominal: Soft. Non-tender, non-distended, bowel sounds are normal, no guarding present.  Ext: warm, no edema Neurological: sleepy but  arousable and oriented  Labs on Admission:  Basic Metabolic Panel:  Recent Labs Lab 07/30/14 1432  NA 137  K 6.4*  CL 105  CO2 <5*  GLUCOSE 667*  BUN 43*  CREATININE 1.77*  CALCIUM 8.9   Liver Function Tests: No results for input(s): AST, ALT, ALKPHOS, BILITOT, PROT, ALBUMIN in the last 168 hours. No results for input(s): LIPASE, AMYLASE in the last 168 hours. No results for input(s): AMMONIA in the last 168 hours. CBC:  Recent Labs Lab 07/30/14 1432  WBC 25.8*  NEUTROABS PENDING  HGB 10.3*  HCT 36.4  MCV 73.5*  PLT 398   Cardiac Enzymes: No results for input(s): CKTOTAL, CKMB, CKMBINDEX, TROPONINI in the last 168 hours. BNP: Invalid input(s): POCBNP CBG:  Recent Labs Lab 07/30/14 1205 07/30/14 1401 07/30/14 1520  GLUCAP 573* >600* 590*    Radiological Exams on Admission: Dg Chest 2 View  07/30/2014   CLINICAL DATA:  Nausea and vomiting; mild difficulty breathing  EXAM: CHEST  2 VIEW  COMPARISON:  December 08, 2009  FINDINGS: Lungs are clear. Heart size and pulmonary vascularity are normal. No adenopathy. No bone lesions.  IMPRESSION: No edema or consolidation.   Electronically Signed   By: Bretta BangWilliam  Woodruff III M.D.   On: 07/30/2014 13:27    EKG: sinus tachycardia with prolonged PR, peaked T waves in V4-V5  Assessment/Plan Principal Problem:   DKA (diabetic ketoacidoses)   Active Problems:   Uncontrolled type 1 diabetes mellitus   Tobacco abuse   Hyperkalemia     #1. Severe DKA in the setting of uncontrolled type 1 DM with non  complaince Admit to ICU Patient receiving 3rd liter IV normal saline bolus in the ED. Will order another 1  litre bolus followed by 3 amps of bicarb drip in sterile water given severe metabolic acidosis ( Ph ,7 and serum bicarb <10) -Started on insulin drip in ED. continue IV hydration. Check BMET 3 hrs for closure of anion gap. Monitor k and mg. Once anion gap closes will add basal insulin and overlap drip for next 2 hours. Check A1C. Patient's mother discontinued her insulin pump prior to coming to the hospital. Diabetic coordinator consult.  Needs counseling on diet and insulin adherence given severe non compliance.  PCCM notified of pts admission to ICU with severe DKA and will  follow peripherally.  #2 . Acute vs chronic vs acute on chronic kidney disease No recent baseline renal fn known. Follow with IV hydration.   Hyperkalemia  ordered 1 gm ca gluconate. Peaked T waves on alteral elads. Monitor on tele  with subsequent labs.  Leukocytosis Likely reactive. UA does show multiple bacteria. Will check urine culture and place her on empiric Rocephin.  Tobacco abuse  order nicotine patch. Needs cessation counseling.   Diet:NPO  DVT prophylaxis: sq Heparin   Code  Status: full code Family Communication: discussed with Mom and brother  at bedside Disposition Plan: admit to ICU  Eddie North [facility] Pager [phone]  Total time spent on admission :70 minutes  If 7PM-7AM, please contact night-coverage [email or web address] Password [facility] 07/30/2014, 4:29 PM

## 2014-07-30 NOTE — ED Notes (Signed)
Pt comes by [facility] for n/v/d since Tuesday. Pt is type 1 diabetic with insulin pump.  Per EMS pt was reading "high" on the glucometer.  Pt had PO Zofran and 500ml NS IV in route.

## 2014-07-30 NOTE — Progress Notes (Signed)
CRITICAL VALUE ALERT  Critical value received:  CO2-6  Date of notification:  07/30/14  Time of notification:  1932  Critical value read back:Yes.    Nurse who received alert:  S.Young,RN  MD notified (1st page):    Time of first page:    MD notified (2nd page):  Time of second page:  Responding MD:  Dr. Gonzella Lexhungel notified  Time MD responded:  2054

## 2014-07-30 NOTE — ED Notes (Signed)
Attempted stick for blood specimens. Unsuccessful.

## 2014-07-30 NOTE — Progress Notes (Signed)
CRITICAL VALUE ALERT  Critical value received:  CO2-10  Date of notification:  07/30/14  Time of notification:  2054  Critical value read back:Yes.    Nurse who received alert:  S.Young,RN  MD notified (1st page):  MD aware of previous value  Time of first page:    MD notified (2nd page):  Time of second page:  Responding MD:    Time MD responded:

## 2014-07-30 NOTE — ED Notes (Signed)
Bed: WA21 Expected date:  Expected time:  Means of arrival:  Comments: Ems, hyperglycemia, abd pain

## 2014-07-30 NOTE — ED Notes (Signed)
First attempt to collect urine sample unsuccessful. Pt has feces mixed with urine due to her diarrhea. RN notified.

## 2014-07-30 NOTE — Progress Notes (Signed)
Inpatient Diabetes Program Recommendations  AACE/ADA: New Consensus Statement on Inpatient Glycemic Control (2013)  Target Ranges:  Prepandial:   less than 140 mg/dL      Peak postprandial:   less than 180 mg/dL (1-2 hours)      Critically ill patients:  140 - 180 mg/dL     Results for Kristin BoozerBRIM, [name] (MRN [id number]) as of 07/30/2014 14:57  Ref. Range 07/30/2014 12:05 07/30/2014 14:01  Glucose-Capillary Latest Range: 70-99 mg/dL 409573 (HH) >811>600 (HH)     Admit with: Suspect DKA  History: Type 1 DM  Home DM Meds: Insulin Pump  Current DM Orders: IV Insulin drip per GlucoStabilizer    **Patient admitted with [name] and abdominal pain.  Suspect DKA.  Note labs are being drawn currently.  **Patient lethargic and having Kussmaul's respirations.  Spoke with family at bedside in ED.  Explained to family what treatments we are giving to patient right now (IVF hydration, lab work, IV insulin drip).  Asked family to bring new insulin pump supplies to hospital for patient.  Explained to family that once patient's condition improves and she is allowed to resume her insulin pump she will need to resume her insulin pump with all new fresh supplies (insulin, tubing, reservoir, insertion site, etc) per insulin pump hospital policy.  Family agreeable.   Will follow while hospitalized. Ambrose FinlandJeannine Johnston Kristin Schreurs RN, MSN, CDE Diabetes Coordinator Inpatient Diabetes Program Team Pager: [phone] (8a-5p)

## 2014-07-30 NOTE — ED Notes (Signed)
I tried x2 to obtain blood, failed, RN notified

## 2014-07-31 LAB — BASIC METABOLIC PANEL
ANION GAP: 15 (ref 5–15)
Anion gap: 16 — ABNORMAL HIGH (ref 5–15)
Anion gap: 17 — ABNORMAL HIGH (ref 5–15)
Anion gap: 11 (ref 5–15)
BUN: 30 mg/dL — ABNORMAL HIGH (ref 6–23)
BUN: 28 mg/dL — AB (ref 6–23)
BUN: 28 mg/dL — ABNORMAL HIGH (ref 6–23)
BUN: 25 mg/dL — ABNORMAL HIGH (ref 6–23)
CALCIUM: 8.3 mg/dL — AB (ref 8.4–10.5)
CALCIUM: 8.2 mg/dL — AB (ref 8.4–10.5)
CHLORIDE: 119 mmol/L — AB (ref 96–112)
CHLORIDE: 116 mmol/L — AB (ref 96–112)
CO2: 12 mmol/L — ABNORMAL LOW (ref 19–32)
CO2: 13 mmol/L — ABNORMAL LOW (ref 19–32)
CO2: 15 mmol/L — ABNORMAL LOW (ref 19–32)
CO2: 19 mmol/L (ref 19–32)
CREATININE: 1.05 mg/dL (ref 0.50–1.10)
CREATININE: 1.1 mg/dL (ref 0.50–1.10)
Calcium: 7.9 mg/dL — ABNORMAL LOW (ref 8.4–10.5)
Calcium: 7.7 mg/dL — ABNORMAL LOW (ref 8.4–10.5)
Chloride: 115 mmol/L — ABNORMAL HIGH (ref 96–112)
Chloride: 114 mmol/L — ABNORMAL HIGH (ref 96–112)
Creatinine, Ser: 1.09 mg/dL (ref 0.50–1.10)
Creatinine, Ser: 1.07 mg/dL (ref 0.50–1.10)
GFR calc Af Amer: 75 mL/min — ABNORMAL LOW (ref >90)
GFR calc non Af Amer: 68 mL/min — ABNORMAL LOW (ref >90)
GFR calc non Af Amer: 65 mL/min — ABNORMAL LOW (ref >90)
GFR, EST AFRICAN AMERICAN: 79 mL/min — AB (ref >90)
GFR, EST AFRICAN AMERICAN: 74 mL/min — AB (ref >90)
GFR, EST AFRICAN AMERICAN: 77 mL/min — AB (ref >90)
GFR, EST NON AFRICAN AMERICAN: 64 mL/min — AB (ref >90)
GFR, EST NON AFRICAN AMERICAN: 66 mL/min — AB (ref >90)
GLUCOSE: 211 mg/dL — AB (ref 70–99)
Glucose, Bld: 141 mg/dL — ABNORMAL HIGH (ref 70–99)
Glucose, Bld: 242 mg/dL — ABNORMAL HIGH (ref 70–99)
Glucose, Bld: 206 mg/dL — ABNORMAL HIGH (ref 70–99)
POTASSIUM: 3.3 mmol/L — AB (ref 3.5–5.1)
Potassium: 4.1 mmol/L (ref 3.5–5.1)
Potassium: 3.9 mmol/L (ref 3.5–5.1)
Potassium: 3.7 mmol/L (ref 3.5–5.1)
SODIUM: 144 mmol/L (ref 135–145)
Sodium: 147 mmol/L — ABNORMAL HIGH (ref 135–145)
Sodium: 145 mmol/L (ref 135–145)
Sodium: 146 mmol/L — ABNORMAL HIGH (ref 135–145)

## 2014-07-31 LAB — CBC
HCT: 27.6 % — ABNORMAL LOW (ref 36.0–46.0)
Hemoglobin: 8.5 g/dL — ABNORMAL LOW (ref 12.0–15.0)
MCH: 21.4 pg — ABNORMAL LOW (ref 26.0–34.0)
MCHC: 30.8 g/dL (ref 30.0–36.0)
MCV: 69.3 fL — ABNORMAL LOW (ref 78.0–100.0)
Platelets: 255 10*3/uL (ref 150–400)
RBC: 3.98 MIL/uL (ref 3.87–5.11)
RDW: 16.5 % — ABNORMAL HIGH (ref 11.5–15.5)
WBC: 18.1 10*3/uL — ABNORMAL HIGH (ref 4.0–10.5)

## 2014-07-31 LAB — GLUCOSE, CAPILLARY
GLUCOSE-CAPILLARY: 219 mg/dL — AB (ref 70–99)
GLUCOSE-CAPILLARY: 232 mg/dL — AB (ref 70–99)
GLUCOSE-CAPILLARY: 189 mg/dL — AB (ref 70–99)
GLUCOSE-CAPILLARY: 172 mg/dL — AB (ref 70–99)
GLUCOSE-CAPILLARY: 121 mg/dL — AB (ref 70–99)
GLUCOSE-CAPILLARY: 397 mg/dL — AB (ref 70–99)
Glucose-Capillary: 106 mg/dL — ABNORMAL HIGH (ref 70–99)
Glucose-Capillary: 119 mg/dL — ABNORMAL HIGH (ref 70–99)
Glucose-Capillary: 126 mg/dL — ABNORMAL HIGH (ref 70–99)
Glucose-Capillary: 133 mg/dL — ABNORMAL HIGH (ref 70–99)
Glucose-Capillary: 141 mg/dL — ABNORMAL HIGH (ref 70–99)
Glucose-Capillary: 155 mg/dL — ABNORMAL HIGH (ref 70–99)
Glucose-Capillary: 201 mg/dL — ABNORMAL HIGH (ref 70–99)
Glucose-Capillary: 204 mg/dL — ABNORMAL HIGH (ref 70–99)
Glucose-Capillary: 207 mg/dL — ABNORMAL HIGH (ref 70–99)
Glucose-Capillary: 216 mg/dL — ABNORMAL HIGH (ref 70–99)
Glucose-Capillary: 415 mg/dL — ABNORMAL HIGH (ref 70–99)

## 2014-07-31 MED ORDER — SODIUM CHLORIDE 0.9 % IV SOLN
INTRAVENOUS | Status: DC
  Administered 2014-07-31: 500 mL via INTRAVENOUS

## 2014-07-31 MED ORDER — INSULIN ASPART 100 UNIT/ML ~~LOC~~ SOLN
0.0000 [IU] | Freq: Three times a day (TID) | SUBCUTANEOUS | Status: DC
  Administered 2014-07-31: 1 [IU] via SUBCUTANEOUS
  Administered 2014-08-01: 3 [IU] via SUBCUTANEOUS
  Administered 2014-08-01: 7 [IU] via SUBCUTANEOUS

## 2014-07-31 MED ORDER — INSULIN GLARGINE 100 UNIT/ML ~~LOC~~ SOLN
20.0000 [IU] | Freq: Once | SUBCUTANEOUS | Status: AC
  Administered 2014-07-31: 20 [IU] via SUBCUTANEOUS
  Filled 2014-07-31: qty 0.2

## 2014-07-31 MED ORDER — SODIUM CHLORIDE 0.9 % IV BOLUS (SEPSIS): 1000.0000 mL | Freq: Once | INTRAVENOUS | Status: DC

## 2014-07-31 MED ORDER — INSULIN ASPART 100 UNIT/ML ~~LOC~~ SOLN
8.0000 [IU] | Freq: Once | SUBCUTANEOUS | Status: AC
  Administered 2014-07-31: 8 [IU] via SUBCUTANEOUS

## 2014-07-31 MED ORDER — DIPHENHYDRAMINE HCL 50 MG/ML IJ SOLN
12.5000 mg | Freq: Once | INTRAMUSCULAR | Status: AC
  Administered 2014-07-31: 12.5 mg via INTRAVENOUS
  Filled 2014-07-31: qty 1

## 2014-07-31 MED ORDER — INSULIN GLARGINE 100 UNIT/ML ~~LOC~~ SOLN: 20.0000 [IU] | Freq: Every day | SUBCUTANEOUS | Status: DC

## 2014-07-31 MED ORDER — INSULIN ASPART 100 UNIT/ML ~~LOC~~ SOLN: 0.0000 [IU] | Freq: Every day | SUBCUTANEOUS | Status: DC

## 2014-07-31 MED ORDER — DEXTROSE-NACL 5-0.45 % IV SOLN: INTRAVENOUS | Status: DC

## 2014-07-31 MED ORDER — DEXTROSE 5 % IV SOLN
1.0000 g | INTRAVENOUS | Status: DC
  Administered 2014-07-31 – 2014-08-01 (×2): 1 g via INTRAVENOUS
  Filled 2014-07-31 (×2): qty 10

## 2014-07-31 MED ORDER — SODIUM CHLORIDE 0.45 % IV SOLN
INTRAVENOUS | Status: DC
  Administered 2014-07-31: 23:00:00 via INTRAVENOUS

## 2014-07-31 MED ORDER — INSULIN GLARGINE 100 UNIT/ML ~~LOC~~ SOLN
20.0000 [IU] | Freq: Every day | SUBCUTANEOUS | Status: DC
  Administered 2014-08-01: 20 [IU] via SUBCUTANEOUS
  Filled 2014-07-31: qty 0.2

## 2014-07-31 MED ORDER — SODIUM CHLORIDE 0.9 % IV BOLUS (SEPSIS)
500.0000 mL | Freq: Once | INTRAVENOUS | Status: AC
  Administered 2014-07-31: 500 mL via INTRAVENOUS

## 2014-07-31 MED ORDER — POTASSIUM CHLORIDE CRYS ER 20 MEQ PO TBCR
40.0000 meq | EXTENDED_RELEASE_TABLET | Freq: Once | ORAL | Status: AC
  Administered 2014-07-31: 20 meq via ORAL
  Filled 2014-07-31: qty 2

## 2014-07-31 MED ORDER — INSULIN ASPART 100 UNIT/ML ~~LOC~~ SOLN
5.0000 [IU] | Freq: Once | SUBCUTANEOUS | Status: AC
  Administered 2014-07-31: 5 [IU] via SUBCUTANEOUS

## 2014-07-31 MED ORDER — KETOROLAC TROMETHAMINE 15 MG/ML IJ SOLN
15.0000 mg | Freq: Four times a day (QID) | INTRAMUSCULAR | Status: DC | PRN
  Administered 2014-07-31 (×2): 15 mg via INTRAVENOUS
  Filled 2014-07-31 (×2): qty 1

## 2014-07-31 NOTE — Progress Notes (Signed)
CARE MANAGEMENT NOTE 07/31/2014  Patient:  Kristin BoozerBRIM,[name]   Account Number:  [id number]  Date Initiated:  07/31/2014  Documentation initiated by:  DAVIS,Kristin  Subjective/Objective Assessment:   hyperglycemia and multiple health problems     Action/Plan:   from home   Anticipated DC Date:  08/03/2014   Anticipated DC Plan:  HOME/SELF CARE  In-house referral  NA      DC Planning Services  CM consult      PAC Choice  NA   Choice offered to / List presented to:  NA           Status of service:  In process, will continue to follow Medicare Important Message given?   (If response is "NO", the following Medicare IM given date fields will be blank) Date Medicare IM given:   Medicare IM given by:   Date Additional Medicare IM given:   Additional Medicare IM given by:    Discharge Disposition:    Per UR Regulation:  Reviewed for med. necessity/level of care/duration of stay  If discussed at Long Length of Stay Meetings, dates discussed:    Comments:  July 31, 2014/Kristin L. Earlene Plateravis, RN, BSN, CCM. Case Management [employer] [phone] No discharge needs present of time of review.

## 2014-07-31 NOTE — Progress Notes (Addendum)
Inpatient Diabetes Program Recommendations  AACE/ADA: New Consensus Statement on Inpatient Glycemic Control (2013)  Target Ranges:  Prepandial:   less than 140 mg/dL      Peak postprandial:   less than 180 mg/dL (1-2 hours)      Critically ill patients:  140 - 180 mg/dL    Results for [name], [name] (MRN [id number]) as of 07/31/2014 12:00  Ref. Range 07/31/2014 08:10  Sodium Latest Range: 135-145 mmol/L 144  Potassium Latest Range: 3.5-5.1 mmol/L 3.7  Chloride Latest Range: 96-112 mmol/L 114 (H)  CO2 Latest Range: 19-32 mmol/L 15 (L)  BUN Latest Range: 6-23 mg/dL 28 (H)  Creatinine Latest Range: 0.50-1.10 mg/dL 4.09  Calcium Latest Range: 8.4-10.5 mg/dL 8.2 (L)  GFR calc non Af Amer Latest Range: >90 mL/min 65 (L)  GFR calc Af Amer Latest Range: >90 mL/min 75 (L)  Glucose Latest Range: 70-99 mg/dL 811 (H)  Anion gap Latest Range: 5-15  15    Admit with: DKA  History: Type 1 DM  Home DM Meds: Insulin Pump  Current DM Orders: IV Insulin drip per GlucoStabilizer    **Still acidotic this AM.  8AM BMET shows CO2 of 15.  **Note A1c has been ordered for patient.   Spoke with patient and her husband this AM.  Asked husband to please bring insulin pump from their car so we can review insulin pump settings.  Patient told me she hasn't seen her Endocrinologist (Dr. Dorisann Frames with [facility]) in over a year.  Husband has called [facility] and asked them to overnight extra insulin pump supplies.  Per patient, she thinks she has enough supplies in the car to get her through three more days of insulin pump use.  Strongly encouraged patient to call the 1-800# manufacturer number (found on the back of the pump) to run a safety check to make sure the pump is running correctly.  Patient stated she does not think the pump malfunctioned.  She thinks she has a stomach bug that precipitated the DKA.  Patient also went on to tell me that the display screen on her  insulin pump is very hard to read and that she needs a new pump.  I asked patient if she thought the display screen may have caused the pump to malfunction but patient again denied this stating it was her stomach bug.  Encouraged patient and husband to call [facility] asap to get an appointment with Dr. Talmage Nap.  Explained to patient that she will need to use all new pump supplies (reservoir, tubing, insertion site, insulin etc) if the MD allows her to resume her insulin pump.  Explained to patient that once her labs are improved and if the MD allows her to restart her pump, she will need to restart the pump and we will turn off the IV insulin drip one hour after the pump has been restarted.  If she does not have enough insulin pump supplies, we will have to give her basal insulin (Lantus or Levemir) and Novolog.  Patient agreeable.  Plan to meet with husband and patient at 12:30 to review insulin pump settings.   Addendum 1250PM: Husband brought in insulin pump.  Screen of pump was not working (patient has to take pump in a very dark room or get under her bedcovers to see the screen).  I was able to take the pump into the bathroom with the lights off and see the basal settings on patient's pump.  Patient gets ~21 units basal insulin on her pump per 24 hour period.  Patient verbalized to me that her Carbohydrate Ratio is 1 unit for every 10 grams of carbohydrates consumed.  Target CBG is 120 mg/dl per pt.  Pt could not tell me what her Sensitivity factor is on her pump and I was not able to assess this on her pump as well.  Pump given back to husband and husband removed the battery from the pump.  At this point, I do not think the pump is safe to use the pump in the hospital b/c patient cannot see the pump's screen in a safe manner.  She needs a new insulin pump but will need to go through her Endocrinologist to do so.  Recommend the following when patient ready to transition off the IV  insulin drip:  1. Give patient Lantus 21 units.  Continue IV insulin drip for 1 hour after Lantus given.  2.  Start Novolog Sensitive SSI immediately upon d/c of IV insulin drip.  3. Start Novolog 4 units tidwc (meal coverage) once patient eating.  4. Would not send patient home on her pump.  Would give her Rxs for Lantus and Novolog and have her follow up with her Endocrinologist for new insulin pump.     Will follow Ambrose FinlandJeannine Johnston Montie Gelardi RN, MSN, CDE Diabetes Coordinator Inpatient Diabetes Program Team Pager: [phone] (8a-5p)

## 2014-07-31 NOTE — Progress Notes (Addendum)
[facility] PROGRESS NOTE  [name] ZOX:[id number] DOB: 11/03/1980 DOA: 07/30/2014 PCP: Raynelle Jan, MD  Assessment/Plan: Severe DKA in the setting of uncontrolled type 1 diabetes mellitus Patient admitted to ICU. Severe DKA with severe metabolic acidosis. Glucose stabilizer protocol following aggressive IV fluid resuscitation. Also placed on bicarbonate drip. Anion gap and bicarbonate level improving. Fluids switched to D5 half normal saline. -Clear liquid diet. -Once anion gap closes will order long-acting insulin with sliding scale coverage. Once family brings in new insulin pump supplies will resume her insulin pump. -Diabetic coordinator following. She will need to establish care with endocrinology as outpatient. Will provide her with contact information. -Supportive care with antiemetics. -Patient counseled regarding diet and medication adherence.  Acute kidney injury Secondary to severe dehydration and resolved with IV fluids.  Hyperkalemia Resolved with IV insulin. Stable on telemetry. Given 1 g calcium gluconate on admission.  UTI Follow urine culture. On empiric Rocephin  Leukocytosis Possibly stress related. Follow-up with repeat lab this morning.  Tobacco abuse Counseled on smoking cessation. Nicotine patch.  DVT prophylaxis: Subcutaneous Lovenox  Diet: Clear liquid  Code Status: Full code Family Communication: Mother, husband and brother at bedside Disposition Plan: Transfer to MedSurg once off insulin drip   Consultants:  None  Procedures:  None  Antibiotics:  IV Rocephin since 3/25  HPI/Subjective: Patient seen and examined this morning. Appears more awake and alert. Reports 2 episodes of vomiting during the night.  Objective: Filed Vitals:   07/31/14 1000  BP: 143/60  Pulse: 86  Temp:   Resp: 15    Intake/Output Summary (Last 24 hours) at 07/31/14 1120 Last data filed at 07/31/14 1000  Gross per 24 hour  Intake 4995.85 ml   Output   1000 ml  Net 3995.85 ml   Filed Weights   07/30/14 1700  Weight: 60 kg (132 lb 4.4 oz)    Exam:   General:  Middle aged thin built female lying in bed appears fatigued  HEENT: No pallor, dry oral mucosa, supple neck, flushed face  Cardiovascular: Normal S1 and S2, no murmurs rub or gallop  Respiratory: Clear to auscultation bilaterally, no added sounds  GI: Soft, nondistended, nontender, bowel sounds present  Musculoskeletal: On, no edema  CNS: Alert and oriented  Data Reviewed: Basic Metabolic Panel:  Recent Labs Lab 07/30/14 1721 07/30/14 2100 07/31/14 0215 07/31/14 0535 07/31/14 0810  NA 145 145 147* 145 144  K 4.9 4.3 4.1 3.9 3.7  CL 118* 118* 119* 115* 114*  CO2 6* 10* 12* 13* 15*  GLUCOSE 287* 198* 141* 211* 242*  BUN 36* 33* 30* 28* 28*  CREATININE 1.56* 1.38* 1.05 1.10 1.09  CALCIUM 8.4 8.3* 8.3* 7.9* 8.2*   Liver Function Tests: No results for input(s): AST, ALT, ALKPHOS, BILITOT, PROT, ALBUMIN in the last 168 hours. No results for input(s): LIPASE, AMYLASE in the last 168 hours. No results for input(s): AMMONIA in the last 168 hours. CBC:  Recent Labs Lab 07/30/14 1432 07/30/14 1848  WBC 25.8* 27.8*  NEUTROABS 20.8*  --   HGB 10.3* 9.8*  HCT 36.4 34.4*  MCV 73.5* 72.9*  PLT 398 340   Cardiac Enzymes: No results for input(s): CKTOTAL, CKMB, CKMBINDEX, TROPONINI in the last 168 hours. BNP (last 3 results) No results for input(s): BNP in the last 8760 hours.  ProBNP (last 3 results) No results for input(s): PROBNP in the last 8760 hours.  CBG:  Recent Labs Lab 07/31/14 0534 07/31/14 8119 07/31/14 0735 07/31/14  [id number] 07/31/14 0948  GLUCAP 207* 201* 216* 219* 232*    Recent Results (from the past 240 hour(s))  MRSA PCR Screening     Status: None   Collection Time: 07/30/14 11:58 AM  Result Value Ref Range Status   MRSA by PCR NEGATIVE NEGATIVE Final    Comment:        The GeneXpert MRSA Assay (FDA approved for  NASAL specimens only), is one component of a comprehensive MRSA colonization surveillance program. It is not intended to diagnose MRSA infection nor to guide or monitor treatment for MRSA infections.      Studies: Dg Chest 2 View  07/30/2014   CLINICAL DATA:  Nausea and vomiting; mild difficulty breathing  EXAM: CHEST  2 VIEW  COMPARISON:  December 08, 2009  FINDINGS: Lungs are clear. Heart size and pulmonary vascularity are normal. No adenopathy. No bone lesions.  IMPRESSION: No edema or consolidation.   Electronically Signed   By: Bretta BangWilliam  Woodruff III M.D.   On: 07/30/2014 13:27    Scheduled Meds: . cefTRIAXone (ROCEPHIN)  IV  1 g Intravenous Q24H  . heparin  5,000 Units Subcutaneous 3 times per day  . nicotine  21 mg Transdermal Daily   Continuous Infusions: . dextrose 5 % and 0.45% NaCl 125 mL/hr at 07/31/14 0434  . insulin (NOVOLIN-R) infusion 8.6 Units/hr (07/31/14 1100)      Time spent: 25 minutes    [name]  [facility] Pager [phone] If 7PM-7AM, please contact night-coverage at [email or web address], password [email or web address] 07/31/2014, 11:20 AM  LOS: 1 day

## 2014-08-01 DIAGNOSIS — E876 Hypokalemia: Secondary | ICD-10-CM

## 2014-08-01 DIAGNOSIS — N179 Acute kidney failure, unspecified: Secondary | ICD-10-CM

## 2014-08-01 DIAGNOSIS — D649 Anemia, unspecified: Secondary | ICD-10-CM | POA: Diagnosis present

## 2014-08-01 LAB — BASIC METABOLIC PANEL
Anion gap: 10 (ref 5–15)
BUN: 24 mg/dL — AB (ref 6–23)
CALCIUM: 7.9 mg/dL — AB (ref 8.4–10.5)
CHLORIDE: 111 mmol/L (ref 96–112)
CO2: 20 mmol/L (ref 19–32)
Creatinine, Ser: 0.9 mg/dL (ref 0.50–1.10)
GFR calc Af Amer: >90 mL/min (ref >90)
GFR, EST NON AFRICAN AMERICAN: 82 mL/min — AB (ref >90)
Glucose, Bld: 267 mg/dL — ABNORMAL HIGH (ref 70–99)
Potassium: 3.4 mmol/L — ABNORMAL LOW (ref 3.5–5.1)
Sodium: 141 mmol/L (ref 135–145)

## 2014-08-01 LAB — CBC
HEMATOCRIT: 27.8 % — AB (ref 36.0–46.0)
Hemoglobin: 8.4 g/dL — ABNORMAL LOW (ref 12.0–15.0)
MCH: 21 pg — AB (ref 26.0–34.0)
MCHC: 30.2 g/dL (ref 30.0–36.0)
MCV: 69.5 fL — AB (ref 78.0–100.0)
Platelets: 186 10*3/uL (ref 150–400)
RBC: 4 MIL/uL (ref 3.87–5.11)
RDW: 17 % — AB (ref 11.5–15.5)
WBC: 12.5 10*3/uL — ABNORMAL HIGH (ref 4.0–10.5)

## 2014-08-01 LAB — URINE CULTURE: SPECIAL REQUESTS: NORMAL

## 2014-08-01 LAB — HEMOGLOBIN A1C
HEMOGLOBIN A1C: 15.7 % — AB (ref 4.8–5.6)
Mean Plasma Glucose: 404 mg/dL

## 2014-08-01 LAB — GLUCOSE, CAPILLARY
Glucose-Capillary: 243 mg/dL — ABNORMAL HIGH (ref 70–99)
Glucose-Capillary: 306 mg/dL — ABNORMAL HIGH (ref 70–99)

## 2014-08-01 MED ORDER — INSULIN ASPART 100 UNIT/ML ~~LOC~~ SOLN
7.0000 [IU] | Freq: Three times a day (TID) | SUBCUTANEOUS | Status: DC
  Administered 2014-08-01 (×2): 7 [IU] via SUBCUTANEOUS

## 2014-08-01 MED ORDER — NICOTINE 21 MG/24HR TD PT24: 21.0000 mg | MEDICATED_PATCH | Freq: Every day | TRANSDERMAL | Status: DC

## 2014-08-01 MED ORDER — POTASSIUM CHLORIDE CRYS ER 20 MEQ PO TBCR
40.0000 meq | EXTENDED_RELEASE_TABLET | Freq: Once | ORAL | Status: AC
  Administered 2014-08-01: 40 meq via ORAL
  Filled 2014-08-01: qty 2

## 2014-08-01 MED ORDER — ACCU-CHEK MULTICLIX LANCETS MISC: Status: DC

## 2014-08-01 MED ORDER — "INSULIN SYRINGE 28G X 1/2"" 1 ML MISC": 7.0000 [IU] | Freq: Three times a day (TID) | Status: DC

## 2014-08-01 MED ORDER — INSULIN GLARGINE 100 UNIT/ML SOLOSTAR PEN: 25.0000 [IU] | PEN_INJECTOR | Freq: Every day | SUBCUTANEOUS | Status: DC

## 2014-08-01 MED ORDER — INSULIN ASPART 100 UNIT/ML ~~LOC~~ SOLN: 7.0000 [IU] | Freq: Three times a day (TID) | SUBCUTANEOUS | Status: DC

## 2014-08-01 NOTE — Progress Notes (Addendum)
Pt discharged home with family. Pt verbalized understanding discharge instructions, follow-up appts, and discharge medications. Pt denied any complaints or concerns at the time of discharge.

## 2014-08-01 NOTE — Discharge Instructions (Signed)
Diabetic Ketoacidosis Diabetic ketoacidosis (DKA) is a life-threatening complication of type 1 diabetes. It must be quickly recognized and treated. Treatment requires hospitalization. CAUSES  When there is no insulin in the body, glucose (sugar) cannot be used, and the body breaks down fat for energy. When fat breaks down, acids (ketones) build up in the blood. Very high levels of glucose and high levels of acids lead to severe loss of body fluids (dehydration) and other dangerous chemical changes. This stresses your vital organs and can cause coma or death. SIGNS AND SYMPTOMS   Tiredness (fatigue).  Weight loss.  Excessive thirst.  Ketones in your urine.  Light-headedness.  Fruity or sweet smelling breath.  Excessive urination.  Visual changes.  Confusion or irritability.  Nausea or vomiting.  Rapid breathing.  Stomachache or abdominal pain. DIAGNOSIS  Your health care provider will diagnose DKA based on your history, physical exam, and blood tests. The health care provider will check to see if you have another illness that caused you to go into DKA. Most of this will be done quickly in an emergency room. TREATMENT   Fluid replacement to correct dehydration.  Insulin.  Correction of electrolytes, such as potassium and sodium.  Antibiotic medicines. PREVENTION  Always take your insulin. Do not skip your insulin injections.  If you are sick, treat yourself quickly. Your body often needs more insulin to fight the illness.  Check your blood glucose regularly.  Check urine ketones if your blood glucose is greater than 240 milligrams per deciliter (mg/dL).  Do not use outdated (expired) insulin.  If your blood glucose is high, drink plenty of fluids. This helps flush out ketones. HOME CARE INSTRUCTIONS   If you are sick, follow the advice of your health care provider.  To prevent dehydration, drink enough water and fluids to keep your urine clear or pale  yellow.  If you cannot eat, alternate between drinking fluids with sugar (soda, juices, flavored gelatin) and salty fluids (broth, bouillon).  If you can eat, follow your usual diet and drink sugar-free liquids (water, diet drinks).  Always take your usual dose of insulin. If you cannot eat or if your glucose is getting too low, call your health care provider for further instructions.  Continue to monitor your blood or urine ketones every 3-4 hours around the clock. Set your alarm clock or have someone wake you up. If you are too sick, have someone test it for you.  Rest and avoid exercise. SEEK MEDICAL CARE IF:   You have a fever.  You have ketones in your urine, or your blood glucose is higher than a level your health care provider suggests. You may need extra insulin. Call your health care provider if you need advice on adjusting your insulin.  You cannot drink at least a tablespoon (15 mL) of fluid every 15-20 minutes.  You have been vomiting for more than 2 hours.  You have symptoms of DKA:  Fruity smelling breath.  Breathing faster or slower.  Becoming very sleepy. SEEK IMMEDIATE MEDICAL CARE IF:   You have signs of dehydration:  Decreased urination.  Increased thirst.  Dry skin and mouth.  Light-headedness.  Your blood glucose is very high (as advised by your health care provider) twice in a row.  You faint.  You have chest pain or trouble breathing.  You have a sudden, severe headache.  You have sudden weakness in one arm or one leg.  You have sudden trouble speaking or swallowing.  You  have vomiting or diarrhea that is getting worse after 3 hours.  You have abdominal pain. MAKE SURE YOU:   Understand these instructions.  Will watch your condition.  Will get help right away if you are not doing well or get worse. Document Released: 04/21/2000 Document Revised: 04/29/2013 Document Reviewed: 10/28/2008 [facility] Patient Information 2015 [employer]  [place]. This information is not intended to replace advice given to you by your health care provider. Make sure you discuss any questions you have with your health care provider.   Nicotine Addiction Nicotine can act as both a stimulant (excites/activates) and a sedative (calms/quiets). Immediately after exposure to nicotine, there is a "kick" caused in part by the drug's stimulation of the adrenal glands and resulting discharge of adrenaline (epinephrine). The rush of adrenaline stimulates the body and causes a sudden release of sugar. This means that smokers are always slightly hyperglycemic. Hyperglycemic means that the blood sugar is high, just like in diabetics. Nicotine also decreases the amount of insulin which helps control sugar levels in the body. There is an increase in blood pressure, breathing, and the rate of heart beats.  In addition, nicotine indirectly causes a release of dopamine in the brain that controls pleasure and motivation. A similar reaction is seen with other drugs of abuse, such as cocaine and heroin. This dopamine release is thought to cause the pleasurable sensations when smoking. In some different cases, nicotine can also create a calming effect, depending on sensitivity of the smoker's nervous system and the dose of nicotine taken. WHAT HAPPENS WHEN NICOTINE IS TAKEN FOR LONG PERIODS OF TIME?  Long-term use of nicotine results in addiction. It is difficult to stop.  Repeated use of nicotine creates tolerance. Higher doses of nicotine are needed to get the "kick." When nicotine use is stopped, withdrawal may last a month or more. Withdrawal may begin within a few hours after the last cigarette. Symptoms peak within the first few days and may lessen within a few weeks. For some people, however, symptoms may last for months or longer. Withdrawal symptoms include:   Irritability.  Craving.  Learning and attention deficits.  Sleep disturbances.  Increased  appetite. Craving for tobacco may last for 6 months or longer. Many behaviors done while using nicotine can also play a part in the severity of withdrawal symptoms. For some people, the feel, smell, and sight of a cigarette and the ritual of obtaining, handling, lighting, and smoking the cigarette are closely linked with the pleasure of smoking. When stopped, they also miss the related behaviors which make the withdrawal or craving worse. While nicotine gum and patches may lessen the drug aspects of withdrawal, cravings often persist. WHAT ARE THE MEDICAL CONSEQUENCES OF NICOTINE USE?  Nicotine addiction accounts for one-third of all cancers. The top cancer caused by tobacco is lung cancer. Lung cancer is the number one cancer killer of both men and women.  Smoking is also associated with cancers of the:  Mouth.  Pharynx.  Larynx.  Esophagus.  Stomach.  Pancreas.  Cervix.  Kidney.  Ureter.  Bladder.  Smoking also causes lung diseases such as lasting (chronic) bronchitis and emphysema.  It worsens asthma in adults and children.  Smoking increases the risk of heart disease, including:  Stroke.  Heart attack.  Vascular disease.  Aneurysm.  Passive or secondary smoke can also increase medical risks including:  Asthma in children.  Sudden Infant Death Syndrome (SIDS).  Additionally, dropped cigarettes are the leading cause of residential fire  fatalities.  Nicotine poisoning has been reported from accidental ingestion of tobacco products by children and pets. Death usually results in a few minutes from respiratory failure (when a person stops breathing) caused by paralysis. TREATMENT   Medication. Nicotine replacement medicines such as nicotine gum and the patch are used to stop smoking. These medicines gradually lower the dosage of nicotine in the body. These medicines do not contain the carbon monoxide and other toxins found in tobacco  smoke.  Hypnotherapy.  Relaxation therapy.  Nicotine Anonymous (a 12-step support program). Find times and locations in your local yellow pages. Document Released: 12/29/2003 Document Revised: 07/17/2011 Document Reviewed: 06/20/2013 [facility] Patient Information 2015 [place], [place]. This information is not intended to replace advice given to you by your health care provider. Make sure you discuss any questions you have with your health care provider.

## 2014-08-01 NOTE — Discharge Summary (Addendum)
Physician Discharge Summary  [name] ZOX:[id number] DOB: 08/12/1980 DOA: 07/30/2014  PCP: Raynelle Jan, MD  Admit date: 07/30/2014 Discharge date: 08/01/2014  Time spent: 35  minutes  Recommendations for Outpatient Follow-up:  1. Discharge home with outpt PCP follow up in 1 week 2.  patient wishes to follow up with her previous endocrinologist Dr Talmage Nap 3. Patient taken off insulin pump and being discharge don lantus 25 units qhs and aspart 7 units tid premeal  Discharge Diagnoses:  Principle problem:  severe Diabetic ketoacidosis  Active Problems:   Uncontrolled type 1 diabetes mellitus   Tobacco abuse   Hyperkalemia   Leucocytosis   Acute kidney injury   Hypokalemia   Anemia   Discharge Condition: fair  Diet recommendation: diabetic  Filed Weights   07/30/14 1700  Weight: 60 kg (132 lb 4.4 oz)    History of present illness:  34 year old female with history of type 1 diabetes mellitus since age of 69 with frequent hospitalizations for DKA (last hospitalized in 2012 ), currently on insulin pump with poor outpatient follow up and monitoring was brought to the ED by her family with one day history of nausea and vomiting. Patient reports waking up during the night the previous day,  with several episodes of nausea and brownish liquid. She reports a total of 7-8 episodes of vomiting and one episode of watery diarrhea last night. Last episode of vomiting was just before arriving to the ED. She denies any sick contacts, recent illness, eating anything outside or recent travel. Patient sleepy during conversation but easily arousable. She denies monitoring her blood glucose and saw her PCP several months back. Reported polyuria but no polydipsia. Patient denied headache, dizziness, fever, chills,chest pain, palpitations, SOB, abdominal pain, dysuria. . Denies loss of weight.  Course in the ED fingersticks were > 600 Patient was afebrile. She was tachycardic to 128, normal  blood pressure, respiratory rate and O2 sat. Blood work showed significant leukocytosis of 25.8k, and 10.3. Chemistry showed sodium of 137, K of 6.4, chloride of 105, CO2 < 5 ( AG >17), BUN of 43 and creatinine 1.77. Blood glucose was 667. EKG showed sinus tachycardia at 120 with peaked T waves in V4 and V5. Venous blood gas done showed pH of 6.99, bicarbonate of 3.2. UA showed significant ketones and many bacteria. Patient ordered for 3 L IV normal saline bolus in the ED .. IV insulin drip initiated in the ED and hospitalists consulted for admission to stepdown. Given her gsevere DKA , pt admitted to ICU.   Hospital Course:  Severe DKA in the setting of uncontrolled type 1 diabetes mellitus Patient admitted to ICU. Severe DKA with severe metabolic acidosis. Glucose stabilizer protocol initialed in ED following aggressive IV fluid resuscitation. Also placed on bicarbonate drip. . Anion gap and bicarbonate level normalised within 24 hrs.  -once AG closed pt was place don lantus with premeal coverage. fsg in 200s this am.  -I Will discharge her on lantus 25 units qhs and premeal aspart 7 u 3 times a day. Patient was on insulin pump prior to admission. It  was reviewed by diabetic coordinator and recommended that since it was over 73 years old , she would need a new pump. Also suspect the pump may be malfunctioned. instructed not to use her current insulin pump. -patient wishes to follow up with her endocrinologist  Dr Talmage Nap as outpt . Has not seen her in last 1& half years. -prescription for lantus and aspart provided. Has  glucometer and test strips at home. Discussed again in detail the importance of tight blood glucose control and preventing complications.  -Patient counseled regarding diet and medication adherence. patient is clinically stable and asymptomatic for past 24 hrs. She can be discharged home with outpt follow up.   Acute kidney injury Secondary to severe dehydration. resolved with IV  fluids.  Hyperkalemia Resolved with IV insulin. subsequently was hypokalemic and replenished.  ?UTI Placed on empiric Rocephin. cx negative. Asymptomatic. Will d/c abx  Leukocytosis Possibly stress related. improved.  Tobacco abuse Counseled on smoking cessation. Nicotine patch prescribed.  Anemia ? Iron def. Iron panel ordered and should be followed as outpt   Code Status: Full code   Family Communication: , husband  at bedside   Disposition Plan: d/c home   Consultants:  None  Procedures:  None  Antibiotics:  IV Rocephin since 3/25   Discharge Exam: Filed Vitals:   08/01/14 0510  BP: 105/54  Pulse: 69  Temp: 98.2 F (36.8 C)  Resp: 18     General: Middle aged thin built female in nAD  HEENT: No pallor, Moist oral mucosa, supple neck,  Cardiovascular: Normal S1 and S2, no murmurs rub or gallop  Respiratory: Clear to auscultation bilaterally, no added sounds  GI: Soft, nondistended, nontender, bowel sounds present  Musculoskeletal: warm, no edema  CNS: Alert and oriented   Discharge Instructions    Current Discharge Medication List    START taking these medications   Details  insulin aspart (NOVOLOG) 100 UNIT/ML injection Inject 7 Units into the skin 3 (three) times daily with meals. Qty: 10 mL, Refills: 11    Insulin Glargine (LANTUS) 100 UNIT/ML Solostar Pen Inject 25 Units into the skin daily at 10 pm. Qty: 15 mL, Refills: 6    Insulin Syringe-Needle U-100 (INSULIN SYRINGE 1CC/28G) 28G X 1/2" 1 ML MISC 7 Units by Does not apply route 3 (three) times daily before meals. Qty: 100 each, Refills: 0    Lancets (ACCU-CHEK MULTICLIX) lancets Use as instructed Qty: 100 each, Refills: 12    nicotine (NICODERM CQ - DOSED IN MG/24 HOURS) 21 mg/24hr patch Place 1 patch (21 mg total) onto the skin daily. Qty: 28 patch, Refills: 3      STOP taking these medications     Insulin Human (INSULIN PUMP) SOLN      naproxen sodium (ANAPROX)  220 MG tablet        No Known Allergies Follow-up Information    Follow up with Raynelle Jan, MD. Schedule an appointment as soon as possible for a visit in 1 week.   Specialty:  Family Medicine   Contact information:   [address] [place] [address] [place] [address] [phone]       Follow up with Dorisann Frames, MD. Schedule an appointment as soon as possible for a visit in 2 weeks.   Specialty:  Endocrinology   Contact information:   [address] [place] [address] [phone]        The results of significant diagnostics from this hospitalization (including imaging, microbiology, ancillary and laboratory) are listed below for reference.    Significant Diagnostic Studies: Dg Chest 2 View  07/30/2014   CLINICAL DATA:  Nausea and vomiting; mild difficulty breathing  EXAM: CHEST  2 VIEW  COMPARISON:  December 08, 2009  FINDINGS: Lungs are clear. Heart size and pulmonary vascularity are normal. No adenopathy. No bone lesions.  IMPRESSION: No edema or consolidation.  Electronically Signed   By: Bretta BangWilliam  Woodruff III M.D.   On: 07/30/2014 13:27    Microbiology: Recent Results (from the past 240 hour(s))  MRSA PCR Screening     Status: None   Collection Time: 07/30/14 11:58 AM  Result Value Ref Range Status   MRSA by PCR NEGATIVE NEGATIVE Final    Comment:        The GeneXpert MRSA Assay (FDA approved for NASAL specimens only), is one component of a comprehensive MRSA colonization surveillance program. It is not intended to diagnose MRSA infection nor to guide or monitor treatment for MRSA infections.   Urine culture     Status: None   Collection Time: 07/31/14 12:29 AM  Result Value Ref Range Status   Specimen Description URINE, CLEAN CATCH  Final   Special Requests Normal  Final   Colony Count   Final    5,000 COLONIES/ML [facility]formed at [facility]    Culture   Final    INSIGNIFICANT GROWTH Performed at [facility]    Report Status 08/01/2014 FINAL  Final     Labs: Basic Metabolic Panel:  Recent Labs Lab 07/31/14 0215 07/31/14 0535 07/31/14 0810 07/31/14 1124 08/01/14 0537  NA 147* 145 144 146* 141  K 4.1 3.9 3.7 3.3* 3.4*  CL 119* 115* 114* 116* 111  CO2 12* 13* 15* 19 20  GLUCOSE 141* 211* 242* 206* 267*  BUN 30* 28* 28* 25* 24*  CREATININE 1.05 1.10 1.09 1.07 0.90  CALCIUM 8.3* 7.9* 8.2* 7.7* 7.9*   Liver Function Tests: No results for input(s): AST, ALT, ALKPHOS, BILITOT, PROT, ALBUMIN in the last 168 hours. No results for input(s): LIPASE, AMYLASE in the last 168 hours. No results for input(s): AMMONIA in the last 168 hours. CBC:  Recent Labs Lab 07/30/14 1432 07/30/14 1848 07/31/14 1124 08/01/14 0553  WBC 25.8* 27.8* 18.1* 12.5*  NEUTROABS 20.8*  --   --   --   HGB 10.3* 9.8* 8.5* 8.4*  HCT 36.4 34.4* 27.6* 27.8*  MCV 73.5* 72.9* 69.3* 69.5*  PLT 398 340 255 186   Cardiac Enzymes: No results for input(s): CKTOTAL, CKMB, CKMBINDEX, TROPONINI in the last 168 hours. BNP: BNP (last 3 results) No results for input(s): BNP in the last 8760 hours.  ProBNP (last 3 results) No results for input(s): PROBNP in the last 8760 hours.  CBG:  Recent Labs Lab 07/31/14 1523 07/31/14 1654 07/31/14 2143 07/31/14 2232 08/01/14 0754  GLUCAP 106* 121* 415* 397* 243*       Signed:  Daxtin Leiker  [facility] 08/01/2014, 10:20 AM

## 2014-09-16 ENCOUNTER — Other Ambulatory Visit: Payer: Self-pay | Admitting: Endocrinology

## 2014-09-16 DIAGNOSIS — E010 Iodine-deficiency related diffuse (endemic) goiter: Secondary | ICD-10-CM

## 2014-09-22 ENCOUNTER — Ambulatory Visit
Admission: RE | Admit: 2014-09-22 | Discharge: 2014-09-22 | Disposition: A | Discharge status: Home / Self Care | Payer: BLUE CROSS/BLUE SHIELD | Source: Ambulatory Visit | Attending: Endocrinology | Admitting: Endocrinology

## 2014-09-22 DIAGNOSIS — E010 Iodine-deficiency related diffuse (endemic) goiter: Secondary | ICD-10-CM

## 2015-03-03 ENCOUNTER — Encounter: Payer: Self-pay | Admitting: *Deleted

## 2015-03-03 ENCOUNTER — Encounter: Payer: BLUE CROSS/BLUE SHIELD | Attending: Endocrinology | Admitting: *Deleted

## 2015-03-03 DIAGNOSIS — E1065 Type 1 diabetes mellitus with hyperglycemia: Secondary | ICD-10-CM | POA: Insufficient documentation

## 2015-03-03 DIAGNOSIS — Z713 Dietary counseling and surveillance: Secondary | ICD-10-CM | POA: Insufficient documentation

## 2015-03-16 NOTE — Progress Notes (Signed)
Insulin Pump Evaluation Visit:  Date: 03/03/15   Appt start time: 1530 end time:  1700.  Assessment:  This patient has DM 1 and their primary concerns today: to learn more about Medtronic 630G insulin pump .   MEDICATIONS: Currently on Lantus and Novolog, but previously was on Animas One Touch Ping insulin pump until 6 months ago when she was taken off of it when hospitalized with DKA  Patient states they are currently testing BG greater than 4 times per day  Current meal time insulin:  Insulin Carb Ratio of 1 unit/10 grams Current Correction insulin:   Insulin Sensitivity Factor of 1 unit/50 mg/dl above Target of 161100   Patient states knowledge of Carb Counting is good  Last A1c was 9.1%   Progress Towards Obtaining an Insulin Pump Goal(s):   Patient states their expectations of pump therapy include: better control Patient expresses understanding that for improved outcomes for their diabetes on an insulin pump they will:  During the first 1-2 weeks (or more) of new pump use, the patient will test BG pre and 2 hour post each meal, bedtime and 3 AM to assist the provider in evaluating accuracy of pump settings and to prevent DKA   Will test BG at least 4 times per day once evaluation time is completed  Change out pump infusion set at least every 3 days  Upload pump information to their pump's Web Based Program on a regular basis so provider can assess patterns and make setting adjustments.     Intervention:    Patient knowledgeable of the difference between delivery of insulin via syringe/pen compared to insulin pump.  Patient knowledgeable of improved insulin delivery via pump due to improved accuracy of dose and flexibility of adjusting bolus insulin based on carb intake and BG correction.  Showed patient the Medtronic 630G insulin pump per her request  Discussed current Continuous Glucose Monitoring options (if needed)  Handouts given during visit include:  Patient has  pump brochure from MD office  Monitoring/Evaluation:    Patient does want to continue with pursuit of Medtronic 630G insulin pump.  Patient agreed for me to e-mail the local Pump Company Rep so they can start the process of obtaining the pump. Contact information provided to the patient.  Patient instructed to go to that pump web-site to complete any learning module on insulin pump prior to next visit  Once pump is shipped, patient to call this office to set up training.  Patient understands the importance of following up with Dr. Talmage NapBalan for regular appointments as scheduled

## 2017-05-10 ENCOUNTER — Other Ambulatory Visit: Payer: Self-pay

## 2017-05-10 ENCOUNTER — Encounter (HOSPITAL_COMMUNITY): Payer: Self-pay

## 2017-05-10 ENCOUNTER — Emergency Department (HOSPITAL_COMMUNITY): Payer: BLUE CROSS/BLUE SHIELD

## 2017-05-10 ENCOUNTER — Emergency Department (HOSPITAL_COMMUNITY)
Admission: EM | Admit: 2017-05-10 | Discharge: 2017-05-10 | Disposition: A | Discharge status: Home / Self Care | Payer: BLUE CROSS/BLUE SHIELD | Attending: Emergency Medicine | Admitting: Emergency Medicine

## 2017-05-10 DIAGNOSIS — F17210 Nicotine dependence, cigarettes, uncomplicated: Secondary | ICD-10-CM | POA: Insufficient documentation

## 2017-05-10 DIAGNOSIS — E860 Dehydration: Secondary | ICD-10-CM | POA: Diagnosis not present

## 2017-05-10 DIAGNOSIS — R1110 Vomiting, unspecified: Secondary | ICD-10-CM | POA: Diagnosis present

## 2017-05-10 DIAGNOSIS — E109 Type 1 diabetes mellitus without complications: Secondary | ICD-10-CM | POA: Diagnosis not present

## 2017-05-10 DIAGNOSIS — Z79899 Other long term (current) drug therapy: Secondary | ICD-10-CM | POA: Diagnosis not present

## 2017-05-10 DIAGNOSIS — Z794 Long term (current) use of insulin: Secondary | ICD-10-CM | POA: Diagnosis not present

## 2017-05-10 LAB — I-STAT CHEM 8, ED
BUN: 50 mg/dL — ABNORMAL HIGH (ref 6–20)
Calcium, Ion: 1.01 mmol/L — ABNORMAL LOW (ref 1.15–1.40)
Chloride: 106 mmol/L (ref 101–111)
Creatinine, Ser: 1.1 mg/dL — ABNORMAL HIGH (ref 0.44–1.00)
Glucose, Bld: 293 mg/dL — ABNORMAL HIGH (ref 65–99)
HCT: 36 % (ref 36.0–46.0)
Hemoglobin: 12.2 g/dL (ref 12.0–15.0)
Potassium: 5.6 mmol/L — ABNORMAL HIGH (ref 3.5–5.1)
Sodium: 141 mmol/L (ref 135–145)
TCO2: 25 mmol/L (ref 22–32)

## 2017-05-10 LAB — COMPREHENSIVE METABOLIC PANEL WITH GFR
ALT: 14 U/L (ref 14–54)
AST: 21 U/L (ref 15–41)
Albumin: 4 g/dL (ref 3.5–5.0)
Alkaline Phosphatase: 86 U/L (ref 38–126)
Anion gap: 14 (ref 5–15)
BUN: 37 mg/dL — ABNORMAL HIGH (ref 6–20)
CO2: 23 mmol/L (ref 22–32)
Calcium: 9.4 mg/dL (ref 8.9–10.3)
Chloride: 104 mmol/L (ref 101–111)
Creatinine, Ser: 1.32 mg/dL — ABNORMAL HIGH (ref 0.44–1.00)
GFR calc Af Amer: 58 mL/min — ABNORMAL LOW
GFR calc non Af Amer: 50 mL/min — ABNORMAL LOW
Glucose, Bld: 295 mg/dL — ABNORMAL HIGH (ref 65–99)
Potassium: 4.4 mmol/L (ref 3.5–5.1)
Sodium: 141 mmol/L (ref 135–145)
Total Bilirubin: 0.9 mg/dL (ref 0.3–1.2)
Total Protein: 7.5 g/dL (ref 6.5–8.1)

## 2017-05-10 LAB — CBC
HEMATOCRIT: 39.6 % (ref 36.0–46.0)
HEMOGLOBIN: 13.3 g/dL (ref 12.0–15.0)
MCH: 26.5 pg (ref 26.0–34.0)
MCHC: 33.6 g/dL (ref 30.0–36.0)
MCV: 78.9 fL (ref 78.0–100.0)
Platelets: 374 10*3/uL (ref 150–400)
RBC: 5.02 MIL/uL (ref 3.87–5.11)
RDW: 16.1 % — ABNORMAL HIGH (ref 11.5–15.5)
WBC: 13.7 10*3/uL — ABNORMAL HIGH (ref 4.0–10.5)

## 2017-05-10 LAB — I-STAT BETA HCG BLOOD, ED (MC, WL, AP ONLY): I-stat hCG, quantitative: <5 m[IU]/mL (ref <5)

## 2017-05-10 LAB — URINALYSIS, ROUTINE W REFLEX MICROSCOPIC
Bacteria, UA: NONE SEEN
Bilirubin Urine: NEGATIVE
Glucose, UA: >=500 mg/dL — AB
Ketones, ur: 5 mg/dL — AB
Leukocytes, UA: NEGATIVE
Nitrite: NEGATIVE
Protein, ur: 100 mg/dL — AB
Specific Gravity, Urine: 1.033 — ABNORMAL HIGH (ref 1.005–1.030)
pH: 5 (ref 5.0–8.0)

## 2017-05-10 LAB — CBG MONITORING, ED
GLUCOSE-CAPILLARY: 249 mg/dL — AB (ref 65–99)
Glucose-Capillary: 335 mg/dL — ABNORMAL HIGH (ref 65–99)
Glucose-Capillary: 321 mg/dL — ABNORMAL HIGH (ref 65–99)

## 2017-05-10 LAB — LIPASE, BLOOD: Lipase: 26 U/L (ref 11–51)

## 2017-05-10 MED ORDER — ONDANSETRON 4 MG PO TBDP: ORAL_TABLET | ORAL | 0 refills | Status: DC

## 2017-05-10 MED ORDER — ONDANSETRON HCL 4 MG/2ML IJ SOLN
4.0000 mg | Freq: Once | INTRAMUSCULAR | Status: AC
  Administered 2017-05-10: 4 mg via INTRAVENOUS
  Filled 2017-05-10: qty 2

## 2017-05-10 MED ORDER — SODIUM CHLORIDE 0.9 % IV BOLUS (SEPSIS)
2000.0000 mL | Freq: Once | INTRAVENOUS | Status: AC
  Administered 2017-05-10: 2000 mL via INTRAVENOUS

## 2017-05-10 MED ORDER — METOCLOPRAMIDE HCL 10 MG PO TABS: 10.0000 mg | ORAL_TABLET | Freq: Four times a day (QID) | ORAL | 0 refills | Status: DC | PRN

## 2017-05-10 MED ORDER — METOCLOPRAMIDE HCL 5 MG/ML IJ SOLN
10.0000 mg | Freq: Once | INTRAMUSCULAR | Status: AC
  Administered 2017-05-10: 10 mg via INTRAVENOUS
  Filled 2017-05-10: qty 2

## 2017-05-10 NOTE — ED Triage Notes (Signed)
Pt BIB husband reporting N/V since Tuesday evening. She is a type 1 diabetic. She reports that her sugars have been running in the 300s. A&Ox4. Ambulatory.

## 2017-05-10 NOTE — ED Notes (Signed)
Went to introduce myself and obtain I-stat lab, she suddenly started vomiting a large amount. Obtained an order for Reglan and administered.

## 2017-05-10 NOTE — Discharge Instructions (Signed)
Drink plenty of fluids follow-up with your doctor next week.  If you continue vomiting just returned to the emergency department for recheck

## 2017-05-10 NOTE — ED Provider Notes (Signed)
[facility]   CSN: [id number] Arrival date & time: 05/10/17  1314     History   Chief Complaint Chief Complaint  Patient presents with  . Nausea  . Emesis    HPI [name] is a 37 y.o. female.  Patient complains of vomiting today and her sugar was elevated.  No fevers no chills some diarrhea   The history is provided by the patient.  Emesis   This is a new problem. The current episode started 12 to 24 hours ago. The problem occurs 2 to 4 times per day. The problem has not changed since onset.The emesis has an appearance of stomach contents. There has been no fever. Pertinent negatives include no abdominal pain, no chills, no cough, no diarrhea and no headaches.    Past Medical History:  Diagnosis Date  . Diabetes mellitus without complication [facility])     Patient Active Problem List   Diagnosis Date Noted  . Hypokalemia 08/01/2014  . Anemia 08/01/2014  . DKA, type 1 (HCC) 07/30/2014  . Uncontrolled type 1 diabetes mellitus (HCC) 07/30/2014  . Tobacco abuse 07/30/2014  . Hyperkalemia 07/30/2014  . Leucocytosis 07/30/2014  . Acute kidney injury (HCC) 07/30/2014    Past Surgical History:  Procedure Laterality Date  . UTERINE FIBROID SURGERY      OB History    No data available       Home Medications    Prior to Admission medications   Medication Sig Start Date End Date Taking? Authorizing Provider  insulin lispro (HUMALOG) 100 UNIT/ML injection Inject 7-10 Units into the skin continuous. Uses daytronic pump for a continuous 7-10 units a day   Yes [provider]  lisinopril (PRINIVIL,ZESTRIL) 2.5 MG tablet Take 2.5 mg by mouth daily.   Yes [provider]  sertraline (ZOLOFT) 100 MG tablet Take 100 mg by mouth daily.   Yes [provider]  insulin aspart (NOVOLOG) 100 UNIT/ML injection Inject 7 Units into the skin 3 (three) times daily with meals. 08/01/14   Dhungel, Theda Belfast, MD    Insulin Glargine (LANTUS) 100 UNIT/ML Solostar Pen Inject 25 Units into the skin daily at 10 pm. 08/01/14   Dhungel, Theda Belfast, MD  Insulin Syringe-Needle U-100 (INSULIN SYRINGE 1CC/28G) 28G X 1/2" 1 ML MISC 7 Units by Does not apply route 3 (three) times daily before meals. 08/01/14   Dhungel, Theda Belfast, MD  Lancets (ACCU-CHEK MULTICLIX) lancets Use as instructed 08/01/14   Dhungel, Nishant, MD  nicotine (NICODERM CQ - DOSED IN MG/24 HOURS) 21 mg/24hr patch Place 1 patch (21 mg total) onto the skin daily. Patient not taking: Reported on 03/03/2015 08/01/14   Eddie North, MD  ondansetron (ZOFRAN ODT) 4 MG disintegrating tablet 4mg  ODT q4 hours prn nausea/vomit 05/10/17   Bethann Berkshire, MD    Family History History reviewed. No pertinent family history.  Social History Social History   Tobacco Use  . Smoking status: Current Every Day Smoker    Types: Cigarettes  Substance Use Topics  . Alcohol use: No  . Drug use: Not on file     Allergies   Patient has no known allergies.   Review of Systems Review of Systems  Constitutional: Negative for appetite change, chills and fatigue.  HENT: Negative for congestion, ear discharge and sinus pressure.   Eyes: Negative for discharge.  Respiratory: Negative for cough.   Cardiovascular: Negative for chest pain.  Gastrointestinal: Positive for vomiting. Negative for abdominal pain and diarrhea.  Genitourinary: Negative for frequency and hematuria.  Musculoskeletal: Negative for back pain.  Skin: Negative for rash.  Neurological: Negative for seizures and headaches.  Psychiatric/Behavioral: Negative for hallucinations.     Physical Exam Updated Vital Signs BP (!) 144/71   Pulse 70   Temp 97.9 F (36.6 C) (Oral)   Resp 18   LMP 04/26/2017   SpO2 97%   Physical Exam  Constitutional: She is oriented to person, place, and time. She appears well-developed.  HENT:  Head: Normocephalic.  Eyes: Conjunctivae and EOM are normal. No  scleral icterus.  Neck: Neck supple. No thyromegaly present.  Cardiovascular: Normal rate and regular rhythm. Exam reveals no gallop and no friction rub.  No murmur heard. Pulmonary/Chest: No stridor. She has no wheezes. She has no rales. She exhibits no tenderness.  Abdominal: She exhibits no distension. There is no tenderness. There is no rebound.  Musculoskeletal: Normal range of motion. She exhibits no edema.  Lymphadenopathy:    She has no cervical adenopathy.  Neurological: She is oriented to person, place, and time. She exhibits normal muscle tone. Coordination normal.  Skin: No rash noted. No erythema.  Psychiatric: She has a normal mood and affect. Her behavior is normal.     ED Treatments / Results  Labs (all labs ordered are listed, but only abnormal results are displayed) Labs Reviewed  COMPREHENSIVE METABOLIC PANEL - Abnormal; Notable for the following components:      Result Value   Glucose, Bld 295 (*)    BUN 37 (*)    Creatinine, Ser 1.32 (*)    GFR calc non Af Amer 50 (*)    GFR calc Af Amer 58 (*)    All other components within normal limits  CBC - Abnormal; Notable for the following components:   WBC 13.7 (*)    RDW 16.1 (*)    All other components within normal limits  URINALYSIS, ROUTINE W REFLEX MICROSCOPIC - Abnormal; Notable for the following components:   APPearance HAZY (*)    Specific Gravity, Urine 1.033 (*)    Glucose, UA >=500 (*)    Hgb urine dipstick SMALL (*)    Ketones, ur 5 (*)    Protein, ur 100 (*)    Squamous Epithelial / LPF 0-5 (*)    All other components within normal limits  CBG MONITORING, ED - Abnormal; Notable for the following components:   Glucose-Capillary 249 (*)    All other components within normal limits  CBG MONITORING, ED - Abnormal; Notable for the following components:   Glucose-Capillary 335 (*)    All other components within normal limits  CBG MONITORING, ED - Abnormal; Notable for the following components:    Glucose-Capillary 321 (*)    All other components within normal limits  I-STAT CHEM 8, ED - Abnormal; Notable for the following components:   Potassium 5.6 (*)    BUN 50 (*)    Creatinine, Ser 1.10 (*)    Glucose, Bld 293 (*)    Calcium, Ion 1.01 (*)    All other components within normal limits  LIPASE, BLOOD  I-STAT BETA HCG BLOOD, ED ([facility], [facility], [facility] ONLY)    EKG  EKG Interpretation None       Radiology Dg Abd Acute W/chest  Result Date: 05/10/2017 CLINICAL DATA:  Nausea.  Vomiting. EXAM: DG ABDOMEN ACUTE W/ 1V CHEST COMPARISON:  July 30, 2014 chest x-ray FINDINGS: The heart, hila, and mediastinum are normal. No pneumothorax. No pulmonary nodules, masses, or  focal infiltrates. No free air, portal venous gas, or pneumatosis. No renal stones identified. There is a paucity of bowel gas which limits evaluation of the bowel. The visualized bones are normal. No ureteral or bladder stones identified. IMPRESSION: 1. Normal chest. 2. There is a complete lack of bowel gas which limits evaluation of the bowel. 3. No other abnormalities. Electronically Signed   By: Gerome Sam III M.D   On: 05/10/2017 17:36    Procedures Procedures (including critical care time)  Medications Ordered in ED Medications  sodium chloride 0.9 % bolus 2,000 mL (0 mLs Intravenous Stopped 05/10/17 1811)  ondansetron (ZOFRAN) injection 4 mg (4 mg Intravenous Given 05/10/17 1753)  ondansetron (ZOFRAN) injection 4 mg (4 mg Intravenous Given 05/10/17 1901)  metoCLOPramide (REGLAN) injection 10 mg (10 mg Intravenous Given 05/10/17 2025)     Initial Impression / Assessment and Plan / ED Course  I have reviewed the triage vital signs and the nursing notes.  Pertinent labs & imaging results that were available during my care of the patient were reviewed by me and considered in my medical decision making (see chart for details).     Patient dehydrated with poorly controlled blood glucose.  She is not in DKA.  Patient  improved with IV fluids and nausea medicine she will be discharged home with some nausea medicine she will return to the emergency department if needed and follow-up with her primary care  Final Clinical Impressions(s) / ED Diagnoses   Final diagnoses:  Dehydration    ED Discharge Orders        Ordered    ondansetron (ZOFRAN ODT) 4 MG disintegrating tablet     05/10/17 2045       Bethann Berkshire, MD 05/10/17 2048

## 2017-05-12 ENCOUNTER — Emergency Department (HOSPITAL_COMMUNITY): Payer: BLUE CROSS/BLUE SHIELD

## 2017-05-12 ENCOUNTER — Inpatient Hospital Stay (HOSPITAL_COMMUNITY)
Admission: EM | Admit: 2017-05-12 | Discharge: 2017-05-14 | DRG: 640 | Disposition: A | Discharge status: Home / Self Care | Payer: BLUE CROSS/BLUE SHIELD | Attending: Internal Medicine | Admitting: Internal Medicine

## 2017-05-12 ENCOUNTER — Encounter (HOSPITAL_COMMUNITY): Payer: Self-pay | Admitting: Radiology

## 2017-05-12 ENCOUNTER — Other Ambulatory Visit: Payer: Self-pay

## 2017-05-12 DIAGNOSIS — F17210 Nicotine dependence, cigarettes, uncomplicated: Secondary | ICD-10-CM | POA: Diagnosis present

## 2017-05-12 DIAGNOSIS — R05 Cough: Secondary | ICD-10-CM

## 2017-05-12 DIAGNOSIS — K210 Gastro-esophageal reflux disease with esophagitis: Secondary | ICD-10-CM | POA: Diagnosis present

## 2017-05-12 DIAGNOSIS — E860 Dehydration: Principal | ICD-10-CM | POA: Diagnosis present

## 2017-05-12 DIAGNOSIS — R059 Cough, unspecified: Secondary | ICD-10-CM

## 2017-05-12 DIAGNOSIS — E876 Hypokalemia: Secondary | ICD-10-CM | POA: Diagnosis present

## 2017-05-12 DIAGNOSIS — K529 Noninfective gastroenteritis and colitis, unspecified: Secondary | ICD-10-CM | POA: Diagnosis present

## 2017-05-12 DIAGNOSIS — I160 Hypertensive urgency: Secondary | ICD-10-CM | POA: Diagnosis present

## 2017-05-12 DIAGNOSIS — J188 Other pneumonia, unspecified organism: Secondary | ICD-10-CM | POA: Diagnosis present

## 2017-05-12 DIAGNOSIS — IMO0002 Reserved for concepts with insufficient information to code with codable children: Secondary | ICD-10-CM | POA: Diagnosis present

## 2017-05-12 DIAGNOSIS — I10 Essential (primary) hypertension: Secondary | ICD-10-CM | POA: Diagnosis present

## 2017-05-12 DIAGNOSIS — D735 Infarction of spleen: Secondary | ICD-10-CM | POA: Diagnosis present

## 2017-05-12 DIAGNOSIS — K3184 Gastroparesis: Secondary | ICD-10-CM | POA: Diagnosis present

## 2017-05-12 DIAGNOSIS — R197 Diarrhea, unspecified: Secondary | ICD-10-CM | POA: Diagnosis present

## 2017-05-12 DIAGNOSIS — R1110 Vomiting, unspecified: Secondary | ICD-10-CM | POA: Diagnosis present

## 2017-05-12 DIAGNOSIS — E1065 Type 1 diabetes mellitus with hyperglycemia: Secondary | ICD-10-CM | POA: Diagnosis present

## 2017-05-12 DIAGNOSIS — E1043 Type 1 diabetes mellitus with diabetic autonomic (poly)neuropathy: Secondary | ICD-10-CM | POA: Diagnosis present

## 2017-05-12 DIAGNOSIS — J189 Pneumonia, unspecified organism: Secondary | ICD-10-CM | POA: Diagnosis present

## 2017-05-12 LAB — COMPREHENSIVE METABOLIC PANEL
ALT: 14 U/L (ref 14–54)
ANION GAP: 11 (ref 5–15)
AST: 23 U/L (ref 15–41)
Albumin: 3.8 g/dL (ref 3.5–5.0)
Alkaline Phosphatase: 84 U/L (ref 38–126)
BUN: 24 mg/dL — ABNORMAL HIGH (ref 6–20)
CHLORIDE: 102 mmol/L (ref 101–111)
CO2: 23 mmol/L (ref 22–32)
Calcium: 9 mg/dL (ref 8.9–10.3)
Creatinine, Ser: 0.92 mg/dL (ref 0.44–1.00)
GFR calc non Af Amer: >60 mL/min (ref >60)
Glucose, Bld: 203 mg/dL — ABNORMAL HIGH (ref 65–99)
Potassium: 3.4 mmol/L — ABNORMAL LOW (ref 3.5–5.1)
SODIUM: 136 mmol/L (ref 135–145)
Total Bilirubin: 1.1 mg/dL (ref 0.3–1.2)
Total Protein: 7.9 g/dL (ref 6.5–8.1)

## 2017-05-12 LAB — URINALYSIS, ROUTINE W REFLEX MICROSCOPIC
Bacteria, UA: NONE SEEN
Bilirubin Urine: NEGATIVE
KETONES UR: 80 mg/dL — AB
Leukocytes, UA: NEGATIVE
Nitrite: NEGATIVE
Protein, ur: >=300 mg/dL — AB
Specific Gravity, Urine: 1.036 — ABNORMAL HIGH (ref 1.005–1.030)
pH: 5 (ref 5.0–8.0)

## 2017-05-12 LAB — CBC
HEMATOCRIT: 42.2 % (ref 36.0–46.0)
HEMOGLOBIN: 14.2 g/dL (ref 12.0–15.0)
MCH: 26.4 pg (ref 26.0–34.0)
MCHC: 33.6 g/dL (ref 30.0–36.0)
MCV: 78.4 fL (ref 78.0–100.0)
Platelets: 374 10*3/uL (ref 150–400)
RBC: 5.38 MIL/uL — AB (ref 3.87–5.11)
RDW: 15.6 % — ABNORMAL HIGH (ref 11.5–15.5)
WBC: 17.7 10*3/uL — ABNORMAL HIGH (ref 4.0–10.5)

## 2017-05-12 LAB — WET PREP, GENITAL
CLUE CELLS WET PREP: NONE SEEN
Sperm: NONE SEEN
Trich, Wet Prep: NONE SEEN
Yeast Wet Prep HPF POC: NONE SEEN

## 2017-05-12 LAB — I-STAT BETA HCG BLOOD, ED (MC, WL, AP ONLY): I-stat hCG, quantitative: 5.8 m[IU]/mL — ABNORMAL HIGH (ref <5)

## 2017-05-12 LAB — CBG MONITORING, ED
GLUCOSE-CAPILLARY: 202 mg/dL — AB (ref 65–99)
GLUCOSE-CAPILLARY: 183 mg/dL — AB (ref 65–99)

## 2017-05-12 LAB — PREGNANCY, URINE: Preg Test, Ur: NEGATIVE

## 2017-05-12 LAB — LIPASE, BLOOD: LIPASE: 31 U/L (ref 11–51)

## 2017-05-12 MED ORDER — ONDANSETRON HCL 4 MG PO TABS: 4.0000 mg | ORAL_TABLET | Freq: Once | ORAL | Status: DC

## 2017-05-12 MED ORDER — ACETAMINOPHEN 325 MG PO TABS
650.0000 mg | ORAL_TABLET | Freq: Once | ORAL | Status: AC
  Administered 2017-05-12: 650 mg via ORAL
  Filled 2017-05-12: qty 2

## 2017-05-12 MED ORDER — SODIUM CHLORIDE 0.9 % IV BOLUS (SEPSIS)
1000.0000 mL | Freq: Once | INTRAVENOUS | Status: AC
  Administered 2017-05-12: 1000 mL via INTRAVENOUS

## 2017-05-12 MED ORDER — LISINOPRIL 2.5 MG PO TABS
2.5000 mg | ORAL_TABLET | Freq: Once | ORAL | Status: AC
  Administered 2017-05-13: 2.5 mg via ORAL
  Filled 2017-05-12: qty 1

## 2017-05-12 MED ORDER — ONDANSETRON HCL 4 MG/2ML IJ SOLN
4.0000 mg | Freq: Once | INTRAMUSCULAR | Status: AC
Start: 2017-05-12 — End: 2017-05-12
  Administered 2017-05-12: 4 mg via INTRAVENOUS
  Filled 2017-05-12: qty 2

## 2017-05-12 MED ORDER — IOPAMIDOL (ISOVUE-300) INJECTION 61%
100.0000 mL | Freq: Once | INTRAVENOUS | Status: AC | PRN
  Administered 2017-05-12: 100 mL via INTRAVENOUS

## 2017-05-12 MED ORDER — SODIUM CHLORIDE 0.9 % IV BOLUS (SEPSIS)
500.0000 mL | Freq: Once | INTRAVENOUS | Status: DC
  Administered 2017-05-12: 500 mL via INTRAVENOUS

## 2017-05-12 MED ORDER — IOPAMIDOL (ISOVUE-300) INJECTION 61%
INTRAVENOUS | Status: AC
  Filled 2017-05-12: qty 100

## 2017-05-12 MED ORDER — ONDANSETRON 4 MG PO TBDP
4.0000 mg | ORAL_TABLET | Freq: Once | ORAL | Status: AC | PRN
  Administered 2017-05-12: 4 mg via ORAL
  Filled 2017-05-12: qty 1

## 2017-05-12 MED ORDER — PROMETHAZINE HCL 25 MG PO TABS
25.0000 mg | ORAL_TABLET | Freq: Once | ORAL | Status: AC
  Administered 2017-05-12: 25 mg via ORAL
  Filled 2017-05-12: qty 1

## 2017-05-12 MED ORDER — MORPHINE SULFATE (PF) 4 MG/ML IV SOLN
4.0000 mg | Freq: Once | INTRAVENOUS | Status: AC
  Administered 2017-05-12: 4 mg via INTRAVENOUS
  Filled 2017-05-12: qty 1

## 2017-05-12 NOTE — ED Provider Notes (Signed)
[facility]   CSN: [id number] Arrival date & time: 05/12/17  1725     History   Chief Complaint Chief Complaint  Patient presents with  . Nausea  . Emesis    HPI [name] is a 37 y.o. female.  HPI 37 year old female with a h/o history of type 1 diabetes presents today with persistent nausea and vomiting that began about 3-4 days ago.  Patient reports about 7 episodes of bilious vomiting per day, reports that she is vomiting bile.  No blood.  She also reporting nausea and intermittent abdominal muscle pain secondary to frequent episodes of vomiting.  Also c/o 3 episodes of diarrhea per day and sore throat from frequent vomiting. No blood in stool. States recent Rx given for amox for ear infection, but was unable to tolerate due to vomiting. Also c/o cough. No other abx in the last 3 months. No fevers or flank pain. No urinary sxs. Was seen in the Ed on 1/3 for similar sxs.  D/c with zofran which she states is not helping sxs.  No recent foreign travel.  No history of eating foul/spoiled/uncooked foods. No contacts with similar sxs. Pt states she is currently menstruating.  Started her menstrual cycle today.   Past Medical History:  Diagnosis Date  . Diabetes mellitus without complication [facility](HCC)     Patient Active Problem List   Diagnosis Date Noted  . Hypokalemia 08/01/2014  . Anemia 08/01/2014  . DKA, type 1 (HCC) 07/30/2014  . Uncontrolled type 1 diabetes mellitus (HCC) 07/30/2014  . Tobacco abuse 07/30/2014  . Hyperkalemia 07/30/2014  . Leucocytosis 07/30/2014  . Acute kidney injury (HCC) 07/30/2014    Past Surgical History:  Procedure Laterality Date  . UTERINE FIBROID SURGERY      OB History    No data available       Home Medications    Prior to Admission medications   Medication Sig Start Date End Date Taking? Authorizing Provider  insulin lispro (HUMALOG) 100 UNIT/ML injection Inject 7-10 Units into the  skin continuous. Uses daytronic pump for a continuous 7-10 units a day   Yes [provider]  Lancets (ACCU-CHEK MULTICLIX) lancets Use as instructed 08/01/14  Yes Dhungel, Nishant, MD  lisinopril (PRINIVIL,ZESTRIL) 2.5 MG tablet Take 2.5 mg by mouth daily.   Yes [provider]  sertraline (ZOLOFT) 100 MG tablet Take 100 mg by mouth daily.   Yes [provider]  insulin aspart (NOVOLOG) 100 UNIT/ML injection Inject 7 Units into the skin 3 (three) times daily with meals. Patient not taking: Reported on 05/12/2017 08/01/14   Dhungel, Theda BelfastNishant, MD  Insulin Glargine (LANTUS) 100 UNIT/ML Solostar Pen Inject 25 Units into the skin daily at 10 pm. Patient not taking: Reported on 05/12/2017 08/01/14   Dhungel, Theda BelfastNishant, MD  Insulin Syringe-Needle U-100 (INSULIN SYRINGE 1CC/28G) 28G X 1/2" 1 ML MISC 7 Units by Does not apply route 3 (three) times daily before meals. Patient not taking: Reported on 05/12/2017 08/01/14   Dhungel, Theda BelfastNishant, MD  metoCLOPramide (REGLAN) 10 MG tablet Take 1 tablet (10 mg total) by mouth every 6 (six) hours as needed for nausea (nausea/headache). Patient not taking: Reported on 05/12/2017 05/10/17   Bethann BerkshireZammit, Joseph, MD  nicotine (NICODERM CQ - DOSED IN MG/24 HOURS) 21 mg/24hr patch Place 1 patch (21 mg total) onto the skin daily. Patient not taking: Reported on 03/03/2015 08/01/14   Dhungel, Theda BelfastNishant, MD  ondansetron (ZOFRAN ODT) 4 MG disintegrating tablet  4mg  ODT q4 hours prn nausea/vomit Patient not taking: Reported on 05/12/2017 05/10/17   Bethann Berkshire, MD    Family History No family history on file.  Social History Social History   Tobacco Use  . Smoking status: Current Every Day Smoker    Types: Cigarettes  Substance Use Topics  . Alcohol use: No  . Drug use: Not on file     Allergies   Patient has no known allergies.   Review of Systems Review of Systems  Constitutional: Negative for chills and fever.  HENT: Positive for sore throat. Negative for  ear pain.   Eyes: Negative for pain and visual disturbance.  Respiratory: Negative for cough and shortness of breath.   Cardiovascular: Negative for chest pain and palpitations.  Gastrointestinal: Positive for abdominal pain, diarrhea, nausea and vomiting. Negative for anal bleeding and constipation.  Genitourinary: Negative for dysuria, flank pain, frequency, hematuria and urgency.  Musculoskeletal: Negative for arthralgias and back pain.  Skin: Negative for color change and rash.  Neurological: Negative for seizures and syncope.  All other systems reviewed and are negative.    Physical Exam Updated Vital Signs BP (!) 196/93   Pulse 64   Temp 98 F (36.7 C) (Oral)   Resp 16   LMP 04/26/2017 Comment: HCG=5.8*mIU 05-12-17 , Instructed to proceed with CT scan.  SpO2 98%   Physical Exam  Constitutional: She appears well-developed and well-nourished. She appears distressed (mild).  HENT:  Head: Normocephalic and atraumatic.  Mouth/Throat: Oropharynx is clear and moist.  No pharyngeal erythema or exudates.  No Tonsillar swelling.    Eyes: Conjunctivae are normal.  Neck: Normal range of motion. Neck supple.  Cardiovascular: Normal rate, regular rhythm and normal heart sounds.  No murmur heard. Pulmonary/Chest: Effort normal. No respiratory distress. She has wheezes. She has no rales.  Abdominal: Soft. Bowel sounds are normal. She exhibits no distension. There is tenderness (LLQ). There is no guarding.  Multiple well healed surgical scars  Genitourinary:  Genitourinary Comments: Exam performed by Karrie Meres,  exam chaperoned Date: 05/12/2017 Pelvic exam: normal external genitalia without evidence of trauma. Menstrual blood noted. VULVA: normal appearing vulva with no masses, tenderness or lesion. VAGINA: normal appearing vagina with normal color and no lesions. No discharge noted in the vaginal vault. Blood pooling on speculum exam. CERVIX: unable to be visualized, cervical  motion tenderness absent, Wet prep and DNA probe for chlamydia and GC obtained.   ADNEXA: normal adnexa in size, nontender and no masses UTERUS: uterus is normal size, shape, consistency and nontender.   Musculoskeletal: She exhibits no edema.  Lymphadenopathy:    She has no cervical adenopathy.  Neurological: She is alert.  Skin: Skin is warm and dry.  Psychiatric: She has a normal mood and affect.  Nursing note and vitals reviewed.    ED Treatments / Results  Labs (all labs ordered are listed, but only abnormal results are displayed) Labs Reviewed  WET PREP, GENITAL - Abnormal; Notable for the following components:      Result Value   WBC, Wet Prep HPF POC MODERATE (*)    All other components within normal limits  COMPREHENSIVE METABOLIC PANEL - Abnormal; Notable for the following components:   Potassium 3.4 (*)    Glucose, Bld 203 (*)    BUN 24 (*)    All other components within normal limits  CBC - Abnormal; Notable for the following components:   WBC 17.7 (*)    RBC 5.38 (*)  RDW 15.6 (*)    All other components within normal limits  URINALYSIS, ROUTINE W REFLEX MICROSCOPIC - Abnormal; Notable for the following components:   Color, Urine AMBER (*)    APPearance HAZY (*)    Specific Gravity, Urine 1.036 (*)    Glucose, UA >=500 (*)    Hgb urine dipstick LARGE (*)    Ketones, ur 80 (*)    Protein, ur >=300 (*)    Squamous Epithelial / LPF 0-5 (*)    All other components within normal limits  I-STAT BETA HCG BLOOD, ED ([facility], [facility], [facility] ONLY) - Abnormal; Notable for the following components:   I-stat hCG, quantitative 5.8 (*)    All other components within normal limits  CBG MONITORING, ED - Abnormal; Notable for the following components:   Glucose-Capillary 202 (*)    All other components within normal limits  CBG MONITORING, ED - Abnormal; Notable for the following components:   Glucose-Capillary 183 (*)    All other components within normal limits  URINE CULTURE    LIPASE, BLOOD  PREGNANCY, URINE  GC/CHLAMYDIA PROBE AMP ([place]) NOT AT [facility]    EKG  EKG Interpretation None       Radiology Ct Abdomen Pelvis W Contrast  Result Date: 05/13/2017 CLINICAL DATA:  Nausea and vomiting. EXAM: CT ABDOMEN AND PELVIS WITH CONTRAST TECHNIQUE: Multidetector CT imaging of the abdomen and pelvis was performed using the standard protocol following bolus administration of intravenous contrast. CONTRAST:  ISOVUE-300 IOPAMIDOL (ISOVUE-300) INJECTION 61% COMPARISON:  Radiographs 05/10/2017.  CT 12/12/2009 FINDINGS: Lower chest: Patchy ground-glass opacities and peribronchovascular opacities in the right lower lobes. Mild right lower lobe bronchial thickening were visualized. Similar ground-glass opacities in the left lower lobe to a lesser extent Hepatobiliary: No focal hepatic lesion. Mild focal fatty infiltration adjacent with falciform ligament. Gallbladder physiologically distended, no calcified stone. No biliary dilatation. Pancreas: Pancreatic ductal prominence of 4 mm without peripancreatic inflammation. No evidence pancreatic mass. Spleen: Ill-defined low density in the spleen is slightly wedge-shaped in may reflect splenic infarct, splenic cleft more inferiorly. No perisplenic fluid. Adrenals/Urinary Tract: Normal adrenal glands. No hydronephrosis or perinephric edema. Homogeneous renal enhancement. Equivocal bladder wall thickening versus nondistention. Stomach/Bowel: Distal esophageal wall thickening with submucosal edema. Stomach is physiologically distended. Possible proximal small bowel wall thickening, with distal small bowel diffusely fluid-filled and prominent. Fluid/liquid stool in the ascending colon, possibly throughout the remainder the colon. Equivocal mild diffuse colonic wall thickening. No bowel obstruction. No mesenteric edema. Normal appendix. Vascular/Lymphatic: Mild aortic atherosclerosis. Circumaortic left renal vein. No enlarged abdominal or  pelvic lymph nodes. Reproductive: Uterus is retroverted and heterogeneous, possible ill-defined anterior fibroid. No adnexal mass. Other: Possible small amount pelvic free fluid. No upper abdominal ascites. No free air or intra-abdominal abscess. Musculoskeletal: There are no acute or suspicious osseous abnormalities. IMPRESSION: 1. Distal esophageal wall thickening, with mild bowel wall thickening of small bowel, and equivocal colonic wall thickening. Fluid-filled distal small bowel and proximal colon. Overall findings suggest esophagitis/enterocolitis, possibly infectious or inflammatory. Distal esophageal wall thickening could alternatively be secondary to esophagitis or reflux. 2. 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  No perisplenic fluid. 3. Patchy ground-glass and peribronchovascular opacities in the right greater than left lower lobe in the lung bases, favoring infectious or inflammatory etiology. 4. Cough with uterine fibroid. Electronically Signed   By: Rubye Oaks M.D.   On: 05/13/2017 00:28    Procedures Procedures (including critical care time)  Medications Ordered in ED Medications  iopamidol (ISOVUE-300)  61 % injection (not administered)  sodium chloride 0.9 % bolus 500 mL (not administered)  cefTRIAXone (ROCEPHIN) 1 g in dextrose 5 % 50 mL IVPB (not administered)  azithromycin (ZITHROMAX) 500 mg in dextrose 5 % 250 mL IVPB (not administered)  lidocaine (XYLOCAINE) 2 % viscous mouth solution 15 mL (not administered)  sodium chloride 0.9 % bolus 500 mL (not administered)  ondansetron (ZOFRAN-ODT) disintegrating tablet 4 mg (4 mg Oral Given 05/12/17 1810)  promethazine (PHENERGAN) tablet 25 mg (25 mg Oral Given 05/12/17 2122)  acetaminophen (TYLENOL) tablet 650 mg (650 mg Oral Given 05/12/17 2123)  sodium chloride 0.9 % bolus 1,000 mL (1,000 mLs Intravenous New Bag/Given 05/12/17 2316)  lisinopril (PRINIVIL,ZESTRIL) tablet 2.5 mg (2.5 mg Oral Given 05/13/17 0011)  ondansetron (ZOFRAN) injection 4 mg  (4 mg Intravenous Given 05/12/17 2340)  morphine 4 MG/ML injection 4 mg (4 mg Intravenous Given 05/12/17 2339)  iopamidol (ISOVUE-300) 61 % injection 100 mL (100 mLs Intravenous Contrast Given 05/12/17 2357)     Initial Impression / Assessment and Plan / ED Course  I have reviewed the triage vital signs and the nursing notes.  Pertinent labs & imaging results that were available during my care of the patient were reviewed by me and considered in my medical decision making (see chart for details).   Staffed pt with Ward, PA who will evaluate the pt. Ward noted RLQ and LLQ abd TTP with guarding. Will CT.   9:40 pm IV team in room. Rechecked pt. Informed of plan for CT. Still nauseated and in pain. Will order additional pain and nausea meds.   10:30 Pt I-STAT bhcg 5.8 IU/L. Discussed with Dr. Silverio Lay and will order urine preg. Likely false positive.  12:52 rechecked patient. States that her nausea has improved and she is now tolerating p.o.  Still complaining of a sore throat, will order viscous lidocaine.  Discussed the results of the CT scan and the plan for admission.  Patient agrees with the plan.   1:03 AM Ward, PA consulted hospitalist. Dr Antionette Char will accept the pt.   Final Clinical Impressions(s) / ED Diagnoses   Final diagnoses:  None   37 year old female with a h/o history of type 1 diabetes presents today with persistent nausea and vomiting that began about 3-4 days ago. Recently evaluated in the ED and d/c with Zofran. Now with worsening NVD despite Zofran and new onset abdominal tenderness. Elevated WBC count, increased from 05/10/17. UA with glucosuria, hematuria, proteinuria, and ketonuria. No evidence of infection. Urine culture sent.  BS 180s-200 here. Pt has a normal anion gap and and normal CO2. I suspect that her ketonuria is likely secondary to decreased PO intake, vomiting, and diarrhea as she does not appear to be in DKA clinically. CT scan was suggestive of pneumonia. Also showed  probable splenic infarct, possible infectious/inflammatory esophagitis/enterocolitis. Initiated antibiotics and admitted to hospitalist.   ED Discharge Orders    None       Karrie Meres, PA-C 05/13/17 0107    Charlynne Pander, MD 05/13/17 [phone]

## 2017-05-12 NOTE — ED Triage Notes (Signed)
Pt from home, [facility].  Pt had multiple tests done.  Pt is a diabetic and reports sugars in the 200's. Pt has not had anything solid to eat since Tuesday.  Pt is alert and oriented.  Pt denies being able to keep fluids down.  Pt reports having gas and belching when she tries to eat or drink.  Pt reports she is currently having diarrhea.

## 2017-05-13 ENCOUNTER — Observation Stay (HOSPITAL_COMMUNITY): Payer: BLUE CROSS/BLUE SHIELD

## 2017-05-13 ENCOUNTER — Other Ambulatory Visit: Payer: Self-pay

## 2017-05-13 ENCOUNTER — Encounter (HOSPITAL_COMMUNITY): Payer: Self-pay | Admitting: Family Medicine

## 2017-05-13 ENCOUNTER — Emergency Department (HOSPITAL_COMMUNITY): Payer: BLUE CROSS/BLUE SHIELD

## 2017-05-13 DIAGNOSIS — K3184 Gastroparesis: Secondary | ICD-10-CM | POA: Diagnosis present

## 2017-05-13 DIAGNOSIS — E876 Hypokalemia: Secondary | ICD-10-CM | POA: Diagnosis present

## 2017-05-13 DIAGNOSIS — J189 Pneumonia, unspecified organism: Secondary | ICD-10-CM | POA: Diagnosis present

## 2017-05-13 DIAGNOSIS — I10 Essential (primary) hypertension: Secondary | ICD-10-CM | POA: Diagnosis present

## 2017-05-13 DIAGNOSIS — F17210 Nicotine dependence, cigarettes, uncomplicated: Secondary | ICD-10-CM | POA: Diagnosis present

## 2017-05-13 DIAGNOSIS — K210 Gastro-esophageal reflux disease with esophagitis: Secondary | ICD-10-CM | POA: Diagnosis present

## 2017-05-13 DIAGNOSIS — K529 Noninfective gastroenteritis and colitis, unspecified: Secondary | ICD-10-CM

## 2017-05-13 DIAGNOSIS — R197 Diarrhea, unspecified: Secondary | ICD-10-CM

## 2017-05-13 DIAGNOSIS — J188 Other pneumonia, unspecified organism: Secondary | ICD-10-CM | POA: Diagnosis present

## 2017-05-13 DIAGNOSIS — E860 Dehydration: Secondary | ICD-10-CM | POA: Diagnosis present

## 2017-05-13 DIAGNOSIS — I160 Hypertensive urgency: Secondary | ICD-10-CM

## 2017-05-13 DIAGNOSIS — D735 Infarction of spleen: Secondary | ICD-10-CM | POA: Diagnosis present

## 2017-05-13 DIAGNOSIS — R1110 Vomiting, unspecified: Secondary | ICD-10-CM

## 2017-05-13 DIAGNOSIS — E1065 Type 1 diabetes mellitus with hyperglycemia: Secondary | ICD-10-CM

## 2017-05-13 DIAGNOSIS — J181 Lobar pneumonia, unspecified organism: Secondary | ICD-10-CM

## 2017-05-13 DIAGNOSIS — E1043 Type 1 diabetes mellitus with diabetic autonomic (poly)neuropathy: Secondary | ICD-10-CM | POA: Diagnosis present

## 2017-05-13 LAB — CBC WITH DIFFERENTIAL/PLATELET
Basophils Absolute: 0.1 10*3/uL (ref 0.0–0.1)
Basophils Relative: 1 %
EOS PCT: 1 %
Eosinophils Absolute: 0.1 10*3/uL (ref 0.0–0.7)
HCT: 37.5 % (ref 36.0–46.0)
Hemoglobin: 12.2 g/dL (ref 12.0–15.0)
LYMPHS ABS: 4.1 10*3/uL — AB (ref 0.7–4.0)
LYMPHS PCT: 26 %
MCH: 25.7 pg — AB (ref 26.0–34.0)
MCHC: 32.5 g/dL (ref 30.0–36.0)
MCV: 79.1 fL (ref 78.0–100.0)
MONO ABS: 1 10*3/uL (ref 0.1–1.0)
Monocytes Relative: 6 %
Neutro Abs: 10.3 10*3/uL — ABNORMAL HIGH (ref 1.7–7.7)
Neutrophils Relative %: 66 %
PLATELETS: 322 10*3/uL (ref 150–400)
RBC: 4.74 MIL/uL (ref 3.87–5.11)
RDW: 15.5 % (ref 11.5–15.5)
WBC: 15.6 10*3/uL — ABNORMAL HIGH (ref 4.0–10.5)

## 2017-05-13 LAB — GLUCOSE, CAPILLARY
GLUCOSE-CAPILLARY: 397 mg/dL — AB (ref 65–99)
GLUCOSE-CAPILLARY: 225 mg/dL — AB (ref 65–99)
GLUCOSE-CAPILLARY: 255 mg/dL — AB (ref 65–99)
Glucose-Capillary: 182 mg/dL — ABNORMAL HIGH (ref 65–99)

## 2017-05-13 LAB — RAPID URINE DRUG SCREEN, HOSP PERFORMED
AMPHETAMINES: NOT DETECTED
BENZODIAZEPINES: NOT DETECTED
Barbiturates: NOT DETECTED
COCAINE: NOT DETECTED
Opiates: NOT DETECTED
Tetrahydrocannabinol: NOT DETECTED

## 2017-05-13 LAB — PROCALCITONIN

## 2017-05-13 LAB — BASIC METABOLIC PANEL
Anion gap: 8 (ref 5–15)
BUN: 19 mg/dL (ref 6–20)
CHLORIDE: 109 mmol/L (ref 101–111)
CO2: 21 mmol/L — ABNORMAL LOW (ref 22–32)
Calcium: 7.7 mg/dL — ABNORMAL LOW (ref 8.9–10.3)
Creatinine, Ser: 0.75 mg/dL (ref 0.44–1.00)
GFR calc Af Amer: >60 mL/min (ref >60)
GFR calc non Af Amer: >60 mL/min (ref >60)
GLUCOSE: 115 mg/dL — AB (ref 65–99)
POTASSIUM: 3.6 mmol/L (ref 3.5–5.1)
Sodium: 138 mmol/L (ref 135–145)

## 2017-05-13 LAB — HEMOGLOBIN A1C
Hgb A1c MFr Bld: 9.6 % — ABNORMAL HIGH (ref 4.8–5.6)
Mean Plasma Glucose: 228.82 mg/dL

## 2017-05-13 LAB — EXPECTORATED SPUTUM ASSESSMENT W GRAM STAIN, RFLX TO RESP C

## 2017-05-13 LAB — ECHOCARDIOGRAM COMPLETE

## 2017-05-13 LAB — INFLUENZA PANEL BY PCR (TYPE A & B)
INFLAPCR: NEGATIVE
Influenza B By PCR: NEGATIVE

## 2017-05-13 LAB — STREP PNEUMONIAE URINARY ANTIGEN: Strep Pneumo Urinary Antigen: NEGATIVE

## 2017-05-13 LAB — CBG MONITORING, ED
Glucose-Capillary: 118 mg/dL — ABNORMAL HIGH (ref 65–99)
Glucose-Capillary: 100 mg/dL — ABNORMAL HIGH (ref 65–99)

## 2017-05-13 LAB — EXPECTORATED SPUTUM ASSESSMENT W REFEX TO RESP CULTURE

## 2017-05-13 LAB — HIV ANTIBODY (ROUTINE TESTING W REFLEX): HIV Screen 4th Generation wRfx: NONREACTIVE

## 2017-05-13 MED ORDER — CEFTRIAXONE SODIUM 1 G IJ SOLR
1.0000 g | INTRAMUSCULAR | Status: DC
  Administered 2017-05-13: 1 g via INTRAVENOUS
  Filled 2017-05-13: qty 10

## 2017-05-13 MED ORDER — DEXTROSE 5 % IV SOLN
1.0000 g | Freq: Once | INTRAVENOUS | Status: AC
  Administered 2017-05-13: 1 g via INTRAVENOUS
  Filled 2017-05-13: qty 10

## 2017-05-13 MED ORDER — LISINOPRIL 5 MG PO TABS
2.5000 mg | ORAL_TABLET | Freq: Every day | ORAL | Status: DC
Start: 2017-05-13 — End: 2017-05-14
  Administered 2017-05-13: 2.5 mg via ORAL
  Filled 2017-05-13 (×2): qty 1

## 2017-05-13 MED ORDER — INSULIN ASPART 100 UNIT/ML ~~LOC~~ SOLN
0.0000 [IU] | SUBCUTANEOUS | Status: DC
  Administered 2017-05-13: 9 [IU] via SUBCUTANEOUS
  Administered 2017-05-13: 5 [IU] via SUBCUTANEOUS
  Administered 2017-05-13: 2.4 [IU] via SUBCUTANEOUS
  Administered 2017-05-13: 3 [IU] via SUBCUTANEOUS
  Administered 2017-05-14 (×2): 2 [IU] via SUBCUTANEOUS
  Administered 2017-05-14: 3 [IU] via SUBCUTANEOUS

## 2017-05-13 MED ORDER — ACETAMINOPHEN 650 MG RE SUPP: 650.0000 mg | Freq: Four times a day (QID) | RECTAL | Status: DC | PRN

## 2017-05-13 MED ORDER — LIDOCAINE VISCOUS 2 % MT SOLN
15.0000 mL | Freq: Once | OROMUCOSAL | Status: AC
  Administered 2017-05-13: 15 mL via OROMUCOSAL
  Filled 2017-05-13: qty 15

## 2017-05-13 MED ORDER — SODIUM CHLORIDE 0.9 % IV BOLUS (SEPSIS)
500.0000 mL | Freq: Once | INTRAVENOUS | Status: AC
  Administered 2017-05-13: 500 mL via INTRAVENOUS

## 2017-05-13 MED ORDER — ONDANSETRON HCL 4 MG PO TABS: 4.0000 mg | ORAL_TABLET | Freq: Four times a day (QID) | ORAL | Status: DC | PRN

## 2017-05-13 MED ORDER — MAGNESIUM SULFATE 2 GM/50ML IV SOLN
2.0000 g | Freq: Once | INTRAVENOUS | Status: AC
  Administered 2017-05-13: 2 g via INTRAVENOUS
  Filled 2017-05-13: qty 50

## 2017-05-13 MED ORDER — SERTRALINE HCL 100 MG PO TABS
100.0000 mg | ORAL_TABLET | Freq: Every day | ORAL | Status: DC
  Administered 2017-05-13: 100 mg via ORAL
  Filled 2017-05-13: qty 1

## 2017-05-13 MED ORDER — METOCLOPRAMIDE HCL 5 MG/ML IJ SOLN: 10.0000 mg | Freq: Three times a day (TID) | INTRAMUSCULAR | Status: DC | PRN

## 2017-05-13 MED ORDER — DEXTROSE 5 % IV SOLN
500.0000 mg | INTRAVENOUS | Status: DC
  Administered 2017-05-13: 500 mg via INTRAVENOUS
  Filled 2017-05-13: qty 500

## 2017-05-13 MED ORDER — ENOXAPARIN SODIUM 40 MG/0.4ML ~~LOC~~ SOLN
40.0000 mg | SUBCUTANEOUS | Status: DC
  Administered 2017-05-13: 40 mg via SUBCUTANEOUS
  Filled 2017-05-13 (×2): qty 0.4

## 2017-05-13 MED ORDER — POTASSIUM CHLORIDE IN NACL 20-0.9 MEQ/L-% IV SOLN
INTRAVENOUS | Status: AC
  Administered 2017-05-13: 07:00:00 via INTRAVENOUS
  Filled 2017-05-13: qty 1000

## 2017-05-13 MED ORDER — FAMOTIDINE IN NACL 20-0.9 MG/50ML-% IV SOLN
20.0000 mg | Freq: Two times a day (BID) | INTRAVENOUS | Status: DC
  Administered 2017-05-13: 20 mg via INTRAVENOUS
  Filled 2017-05-13 (×2): qty 50

## 2017-05-13 MED ORDER — PANTOPRAZOLE SODIUM 40 MG IV SOLR
40.0000 mg | Freq: Every day | INTRAVENOUS | Status: DC
  Administered 2017-05-13: 40 mg via INTRAVENOUS
  Filled 2017-05-13: qty 40

## 2017-05-13 MED ORDER — HYDROCODONE-ACETAMINOPHEN 5-325 MG PO TABS
1.0000 | ORAL_TABLET | ORAL | Status: DC | PRN
  Administered 2017-05-13: 1 via ORAL
  Filled 2017-05-13: qty 1

## 2017-05-13 MED ORDER — DEXTROSE 5 % IV SOLN
500.0000 mg | Freq: Once | INTRAVENOUS | Status: AC
  Administered 2017-05-13: 500 mg via INTRAVENOUS
  Filled 2017-05-13: qty 500

## 2017-05-13 MED ORDER — ONDANSETRON HCL 4 MG/2ML IJ SOLN
4.0000 mg | Freq: Four times a day (QID) | INTRAMUSCULAR | Status: DC | PRN
  Administered 2017-05-13 (×4): 4 mg via INTRAVENOUS
  Filled 2017-05-13 (×4): qty 2

## 2017-05-13 MED ORDER — KETOROLAC TROMETHAMINE 30 MG/ML IJ SOLN
30.0000 mg | Freq: Four times a day (QID) | INTRAMUSCULAR | Status: DC | PRN
  Administered 2017-05-13 (×2): 30 mg via INTRAVENOUS
  Filled 2017-05-13 (×2): qty 1

## 2017-05-13 MED ORDER — POTASSIUM CHLORIDE IN NACL 20-0.9 MEQ/L-% IV SOLN
INTRAVENOUS | Status: AC
  Administered 2017-05-13 (×2): via INTRAVENOUS
  Filled 2017-05-13: qty 1000

## 2017-05-13 MED ORDER — ACETAMINOPHEN 325 MG PO TABS: 650.0000 mg | ORAL_TABLET | Freq: Four times a day (QID) | ORAL | Status: DC | PRN

## 2017-05-13 NOTE — H&P (Signed)
History and Physical    [name] ZOX:[id number] DOB: 1981-02-15 DOA: 05/12/2017  PCP: Raynelle Jan, MD   Patient coming from: Home  Chief Complaint: Nausea, vomiting, diarrhea   HPI: [name] is a 37 y.o. female with medical history significant for poorly controlled type 1 diabetes mellitus, now presenting to the emergency department with 5 days of nausea, vomiting, diarrhea, sore throat, and cough. She had been in her usual state until 5 days ago when she noted the insidious development of sore throat, cough, and nausea with non-bloody emesis and non-bloody diarrhea. She denies fever, chest pain, or SOB. Denies recent travel or sick contacts. No significant abdominal pain. Denies urinary symptoms. Was seen in ED a couple days ago with reassuring labs and was prescribed antiemetics. She continues to vomit and have diarrhea and reports difficulty keeping anything down.   ED Course: Upon arrival to the ED, patient is found to be afebrile, saturating well on room air, hypertensive to 195/95, and with vitals otherwise stable.  Chemistry panel features a slight hypokalemia and CBC is notable for a leukocytosis to 17,700.  Urinalysis features elevated specific gravity, glucosuria, proteinuria, and ketonuria, but no suggestion of infection.  CT of the abdomen and pelvis was performed and reveals distal esophageal thickening, mild small bowel wall thickening suggestive of esophagitis/enterocolitis, as well as patchy groundglass opacities in the right greater than left lung base suggestive of a possible infection.  Patient was treated with 2 L of normal saline, acetaminophen, Zofran, Phenergan, lisinopril, and azithromycin Rocephin in the ED.  She remains hemodynamically stable, in no apparent respiratory distress, and will be observed on-surgical unit for ongoing evaluation and gastroenteritis with dehydration and possible pneumonia.  Review of Systems:  All other systems reviewed and apart from  HPI, are negative.  Past Medical History:  Diagnosis Date  . Diabetes mellitus without complication [facility])     Past Surgical History:  Procedure Laterality Date  . UTERINE FIBROID SURGERY       reports that she has been smoking cigarettes.  She does not have any smokeless tobacco history on file. She reports that she does not drink alcohol. Her drug history is not on file.  No Known Allergies  History reviewed. No pertinent family history.   Prior to Admission medications   Medication Sig Start Date End Date Taking? Authorizing Provider  insulin lispro (HUMALOG) 100 UNIT/ML injection Inject 7-10 Units into the skin continuous. Uses daytronic pump for a continuous 7-10 units a day   Yes [provider]  Lancets (ACCU-CHEK MULTICLIX) lancets Use as instructed 08/01/14  Yes Dhungel, Nishant, MD  lisinopril (PRINIVIL,ZESTRIL) 2.5 MG tablet Take 2.5 mg by mouth daily.   Yes [provider]  sertraline (ZOLOFT) 100 MG tablet Take 100 mg by mouth daily.   Yes [provider]    Physical Exam: Vitals:   05/12/17 2314 05/12/17 2321 05/13/17 0011 05/13/17 0113  BP: (!) 171/109  (!) 196/93   Pulse: 64     Resp: 16     Temp:      TempSrc:      SpO2: 100% 98%  98%      Constitutional: NAD, calm  Eyes: PERTLA, lids and conjunctivae normal ENMT: Mucous membranes are dry. Posterior pharynx clear of any exudate or lesions.   Neck: normal, supple, no masses, no thyromegaly Respiratory: Rhonchi at right base, no wheezing, no crackles. Normal respiratory effort.    Cardiovascular: S1 & S2 heard, regular rate  and rhythm. No extremity edema. 2+ pedal pulses. No significant JVD. Abdomen: No distension, no tenderness, soft, mild generalized tenderness. Bowel sounds active.  Musculoskeletal: no clubbing / cyanosis. No joint deformity upper and lower extremities.    Skin: no significant rashes, lesions, ulcers. Warm, dry, well-perfused. Neurologic: CN 2-12 grossly  intact. Sensation intact. Strength 5/5 in all 4 limbs.  Psychiatric: Alert and oriented x 3. Calm.     Labs on Admission: I have personally reviewed following labs and imaging studies  CBC: Recent Labs  Lab 05/10/17 1359 05/10/17 2033 05/12/17 2150  WBC 13.7*  --  17.7*  HGB 13.3 12.2 14.2  HCT 39.6 36.0 42.2  MCV 78.9  --  78.4  PLT 374  --  374   Basic Metabolic Panel: Recent Labs  Lab 05/10/17 1359 05/10/17 2033 05/12/17 2150  NA 141 141 136  K 4.4 5.6* 3.4*  CL 104 106 102  CO2 23  --  23  GLUCOSE 295* 293* 203*  BUN 37* 50* 24*  CREATININE 1.32* 1.10* 0.92  CALCIUM 9.4  --  9.0   GFR: CrCl cannot be calculated (Unknown ideal weight.). Liver Function Tests: Recent Labs  Lab 05/10/17 1359 05/12/17 2150  AST 21 23  ALT 14 14  ALKPHOS 86 84  BILITOT 0.9 1.1  PROT 7.5 7.9  ALBUMIN 4.0 3.8   Recent Labs  Lab 05/10/17 1359 05/12/17 2150  LIPASE 26 31   No results for input(s): AMMONIA in the last 168 hours. Coagulation Profile: No results for input(s): INR, PROTIME in the last 168 hours. Cardiac Enzymes: No results for input(s): CKTOTAL, CKMB, CKMBINDEX, TROPONINI in the last 168 hours. BNP (last 3 results) No results for input(s): PROBNP in the last 8760 hours. HbA1C: No results for input(s): HGBA1C in the last 72 hours. CBG: Recent Labs  Lab 05/10/17 1353 05/10/17 1626 05/10/17 1902 05/12/17 2119 05/12/17 2316  GLUCAP 249* 335* 321* 202* 183*   Lipid Profile: No results for input(s): CHOL, HDL, LDLCALC, TRIG, CHOLHDL, LDLDIRECT in the last 72 hours. Thyroid Function Tests: No results for input(s): TSH, T4TOTAL, FREET4, T3FREE, THYROIDAB in the last 72 hours. Anemia Panel: No results for input(s): VITAMINB12, FOLATE, FERRITIN, TIBC, IRON, RETICCTPCT in the last 72 hours. Urine analysis:    Component Value Date/Time   COLORURINE AMBER (A) 05/12/2017 2243   APPEARANCEUR HAZY (A) 05/12/2017 2243   LABSPEC 1.036 (H) 05/12/2017 2243    PHURINE 5.0 05/12/2017 2243   GLUCOSEU >=500 (A) 05/12/2017 2243   HGBUR LARGE (A) 05/12/2017 2243   BILIRUBINUR NEGATIVE 05/12/2017 2243   KETONESUR 80 (A) 05/12/2017 2243   PROTEINUR >=300 (A) 05/12/2017 2243   UROBILINOGEN 1.0 07/30/2014 1241   NITRITE NEGATIVE 05/12/2017 2243   LEUKOCYTESUR NEGATIVE 05/12/2017 2243   Sepsis Labs: @LABRCNTIP (procalcitonin:4,lacticidven:4) ) Recent Results (from the past 240 hour(s))  Wet prep, genital     Status: Abnormal   Collection Time: 05/12/17 11:00 PM  Result Value Ref Range Status   Yeast Wet Prep HPF POC NONE SEEN NONE SEEN Final   Trich, Wet Prep NONE SEEN NONE SEEN Final   Clue Cells Wet Prep HPF POC NONE SEEN NONE SEEN Final   WBC, Wet Prep HPF POC MODERATE (A) NONE SEEN Final   Sperm NONE SEEN  Final     Radiological Exams on Admission: Ct Abdomen Pelvis W Contrast  Result Date: 05/13/2017 CLINICAL DATA:  Nausea and vomiting. EXAM: CT ABDOMEN AND PELVIS WITH CONTRAST TECHNIQUE: Multidetector CT imaging of  the abdomen and pelvis was performed using the standard protocol following bolus administration of intravenous contrast. CONTRAST:  100mL ISOVUE-300 IOPAMIDOL (ISOVUE-300) INJECTION 61% COMPARISON:  Radiographs 05/10/2017.  CT 12/12/2009 FINDINGS: Lower chest: Patchy ground-glass opacities and peribronchovascular opacities in the right lower lobes. Mild right lower lobe bronchial thickening were visualized. Similar ground-glass opacities in the left lower lobe to a lesser extent Hepatobiliary: No focal hepatic lesion. Mild focal fatty infiltration adjacent with falciform ligament. Gallbladder physiologically distended, no calcified stone. No biliary dilatation. Pancreas: Pancreatic ductal prominence of 4 mm without peripancreatic inflammation. No evidence pancreatic mass. Spleen: Ill-defined low density in the spleen is slightly wedge-shaped in may reflect splenic infarct, splenic cleft more inferiorly. No perisplenic fluid.  Adrenals/Urinary Tract: Normal adrenal glands. No hydronephrosis or perinephric edema. Homogeneous renal enhancement. Equivocal bladder wall thickening versus nondistention. Stomach/Bowel: Distal esophageal wall thickening with submucosal edema. Stomach is physiologically distended. Possible proximal small bowel wall thickening, with distal small bowel diffusely fluid-filled and prominent. Fluid/liquid stool in the ascending colon, possibly throughout the remainder the colon. Equivocal mild diffuse colonic wall thickening. No bowel obstruction. No mesenteric edema. Normal appendix. Vascular/Lymphatic: Mild aortic atherosclerosis. Circumaortic left renal vein. No enlarged abdominal or pelvic lymph nodes. Reproductive: Uterus is retroverted and heterogeneous, possible ill-defined anterior fibroid. No adnexal mass. Other: Possible small amount pelvic free fluid. No upper abdominal ascites. No free air or intra-abdominal abscess. Musculoskeletal: There are no acute or suspicious osseous abnormalities. IMPRESSION: 1. Distal esophageal wall thickening, with mild bowel wall thickening of small bowel, and equivocal colonic wall thickening. Fluid-filled distal small bowel and proximal colon. Overall findings suggest esophagitis/enterocolitis, possibly infectious or inflammatory. Distal esophageal wall thickening could alternatively be secondary to esophagitis or reflux. 2. 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  No perisplenic fluid. 3. Patchy ground-glass and peribronchovascular opacities in the right greater than left lower lobe in the lung bases, favoring infectious or inflammatory etiology. 4. Cough with uterine fibroid. Electronically Signed   By: Rubye OaksMelanie  Ehinger M.D.   On: 05/13/2017 00:28    EKG: Not performed.   Assessment/Plan  1. Vomiting and diarrhea with dehydration  - Presents with 5 days of N/V/D, no significant pain and no fevers  - CT with non-specific findings, no SBO, no abscess, no free-air  - Check GI  pathogen panel, maintain enteric precautions, continue IVF hydration, continue antiemetics, monitor and replete electrolytes    2. Type I DM - A1c was 15.7% in 2016 with no more recent values available  - Managed at home with pump; will check CBG's and treat with Novolog per sliding-scale while in hospital    3. Pneumonia  - Reports cough, no significant SOB  - CT abd/pelvis with patchy opacities in bases, R>L, suggestive of infection  - Treated in ED with Rocephin and azithromycin  - Possibly viral; aspiration also considered given the frequent vomiting  - Check sputum culture and strep pneumo antigen, check procalcitonin, continue azithromycin and Rocephin for now   4. Hypertensive urgency  - BP 195/95 in ED in setting of N/V  - Continue lisinopril, treat nausea/vomiting as above    5. Hypokalemia  - Serum potassium 3.4 in setting of vomiting and diarrhea  - KCl added to IVF, will repeat chem panel in am     DVT prophylaxis: Lovenox  Code Status: Full  Family Communication: Discussed with patient Disposition Plan: Observe on med-surg Consults called: None Admission status: Observation     Briscoe Deutscherimothy S Opyd, MD [facility] Pager [phone]  If 7PM-7AM,  please contact night-coverage [email or web address] Password [email or web address]  05/13/2017, 1:19 AM

## 2017-05-13 NOTE — Progress Notes (Signed)
Dr. Hanley BenAlekh aware pt has insulin pump on. Clarification orders received. See MD orders entered into Epic. Dr. Hanley BenAlekh instructed pt to remove pump, and instructed nurse to follow sliding scale and administered Novolog from floor per order.

## 2017-05-13 NOTE — Consult Note (Signed)
[facility]astroenterology Consultation Note  Referring Provider: Dr. Hanley Ben [facility]) Primary Care Physician:  Raynelle Jan, MD Primary Gastroenterologist:  None  Reason for Consultation:  Nausea and vomiting  HPI: [name] is a 37 y.o. female with poorly controlled IDDM (glucose in 200s recently) with several day history of nausea and vomiting and diarrhea.  No obvious sick contacts or new medications.  Chronic nausea for years, but several days different and more severe symptoms.  Abnormal CT with esophageal, small bowel and possible colonic thickening.   Past Medical History:  Diagnosis Date  . Diabetes mellitus without complication [facility])     Past Surgical History:  Procedure Laterality Date  . UTERINE FIBROID SURGERY      Prior to Admission medications   Medication Sig Start Date End Date Taking? Authorizing Provider  insulin lispro (HUMALOG) 100 UNIT/ML injection Inject 7-10 Units into the skin continuous. Uses daytronic pump for a continuous 7-10 units a day   Yes [provider]  Lancets (ACCU-CHEK MULTICLIX) lancets Use as instructed 08/01/14  Yes Dhungel, Nishant, MD  lisinopril (PRINIVIL,ZESTRIL) 2.5 MG tablet Take 2.5 mg by mouth daily.   Yes [provider]  sertraline (ZOLOFT) 100 MG tablet Take 100 mg by mouth daily.   Yes [provider]    Current Facility-Administered Medications  Medication Dose Route Frequency Provider Last Rate Last Dose  . acetaminophen (TYLENOL) tablet 650 mg  650 mg Oral Q6H PRN Opyd, Lavone Neri, MD       Or  . acetaminophen (TYLENOL) suppository 650 mg  650 mg Rectal Q6H PRN Opyd, Lavone Neri, MD      . azithromycin (ZITHROMAX) 500 mg in dextrose 5 % 250 mL IVPB  500 mg Intravenous Q24H Opyd, Lavone Neri, MD      . cefTRIAXone (ROCEPHIN) 1 g in dextrose 5 % 50 mL IVPB  1 g Intravenous Q24H Opyd, Timothy S, MD      . enoxaparin (LOVENOX) injection 40 mg  40 mg Subcutaneous Q24H Opyd, Lavone Neri, MD   40 mg at 05/13/17  1120  . HYDROcodone-acetaminophen (NORCO/VICODIN) 5-325 MG per tablet 1-2 tablet  1-2 tablet Oral Q4H PRN Opyd, Lavone Neri, MD      . insulin aspart (novoLOG) injection 0-9 Units  0-9 Units Subcutaneous Q4H Opyd, Lavone Neri, MD   9 Units at 05/13/17 1323  . ketorolac (TORADOL) 30 MG/ML injection 30 mg  30 mg Intravenous Q6H PRN Opyd, Lavone Neri, MD   30 mg at 05/13/17 0853  . lisinopril (PRINIVIL,ZESTRIL) tablet 2.5 mg  2.5 mg Oral Daily Opyd, Lavone Neri, MD   2.5 mg at 05/13/17 1121  . metoCLOPramide (REGLAN) injection 10 mg  10 mg Intravenous Q8H PRN Opyd, Lavone Neri, MD      . ondansetron (ZOFRAN) tablet 4 mg  4 mg Oral Q6H PRN Opyd, Lavone Neri, MD       Or  . ondansetron (ZOFRAN) injection 4 mg  4 mg Intravenous Q6H PRN Opyd, Lavone Neri, MD   4 mg at 05/13/17 0853  . pantoprazole (PROTONIX) injection 40 mg  40 mg Intravenous Daily Glade Lloyd, MD   40 mg at 05/13/17 1323  . sertraline (ZOLOFT) tablet 100 mg  100 mg Oral Daily Opyd, Lavone Neri, MD   100 mg at 05/13/17 1120    Allergies as of 05/12/2017  . (No Known Allergies)    History reviewed. No pertinent family history.  Social History   Socioeconomic History  . Marital  status: Married    Spouse name: Not on file  . Number of children: Not on file  . Years of education: Not on file  . Highest education level: Not on file  Social Needs  . Financial resource strain: Not on file  . Food insecurity - worry: Not on file  . Food insecurity - inability: Not on file  . Transportation needs - medical: Not on file  . Transportation needs - non-medical: Not on file  Occupational History  . Not on file  Tobacco Use  . Smoking status: Current Every Day Smoker    Types: Cigarettes  Substance and Sexual Activity  . Alcohol use: No  . Drug use: Not on file  . Sexual activity: Not on file  Other Topics Concern  . Not on file  Social History Narrative  . Not on file    Review of Systems: As per HPI, all others negative  Physical  Exam: Vital signs in last 24 hours: Temp:  [98 F (36.7 C)-99 F (37.2 C)] 99 F (37.2 C) (01/06 1000) Pulse Rate:  [61-68] 68 (01/06 1000) Resp:  [15-18] 15 (01/06 0646) BP: (114-196)/(62-116) 114/62 (01/06 1000) SpO2:  [98 %-100 %] 98 % (01/06 1000)   General:   Alert,  Well-developed, well-nourished, pleasant and cooperative in NAD Head:  Normocephalic and atraumatic. Eyes:  Sclera clear, no icterus.   Conjunctiva pink. Ears:  Normal auditory acuity. Nose:  No deformity, discharge,  or lesions. Mouth:  No deformity or lesions.  Oropharynx pink & moist. Neck:  Supple; no masses or thyromegaly. Lungs:  Clear throughout to auscultation.   No wheezes, crackles, or rhonchi. No acute distress. Heart:  Regular rate and rhythm; no murmurs, clicks, rubs,  or gallops. Abdomen:  Soft, nontender and nondistended. No masses, hepatosplenomegaly or hernias noted. Normal bowel sounds, without guarding, and without rebound.     Msk:  Symmetrical without gross deformities. Normal posture. Pulses:  Normal pulses noted. Extremities:  Without clubbing or edema. Neurologic:  Alert and  oriented x4; grossly normal neurologically. Psych:  Alert and cooperative. Depressed mood, flat affect   Lab Results: Recent Labs    05/10/17 2033 05/12/17 2150 05/13/17 0445  WBC  --  17.7* 15.6*  HGB 12.2 14.2 12.2  HCT 36.0 42.2 37.5  PLT  --  374 322   BMET Recent Labs    05/10/17 2033 05/12/17 2150 05/13/17 0445  NA 141 136 138  K 5.6* 3.4* 3.6  CL 106 102 109  CO2  --  23 21*  GLUCOSE 293* 203* 115*  BUN 50* 24* 19  CREATININE 1.10* 0.92 0.75  CALCIUM  --  9.0 7.7*   LFT Recent Labs    05/12/17 2150  PROT 7.9  ALBUMIN 3.8  AST 23  ALT 14  ALKPHOS 84  BILITOT 1.1   PT/INR No results for input(s): LABPROT, INR in the last 72 hours.  Studies/Results: Ct Abdomen Pelvis W Contrast  Result Date: 05/13/2017 CLINICAL DATA:  Nausea and vomiting. EXAM: CT ABDOMEN AND PELVIS WITH CONTRAST  TECHNIQUE: Multidetector CT imaging of the abdomen and pelvis was performed using the standard protocol following bolus administration of intravenous contrast. CONTRAST:  ISOVUE-300 IOPAMIDOL (ISOVUE-300) INJECTION 61% COMPARISON:  Radiographs 05/10/2017.  CT 12/12/2009 FINDINGS: Lower chest: Patchy ground-glass opacities and peribronchovascular opacities in the right lower lobes. Mild right lower lobe bronchial thickening were visualized. Similar ground-glass opacities in the left lower lobe to a lesser extent Hepatobiliary: No focal hepatic  lesion. Mild focal fatty infiltration adjacent with falciform ligament. Gallbladder physiologically distended, no calcified stone. No biliary dilatation. Pancreas: Pancreatic ductal prominence of 4 mm without peripancreatic inflammation. No evidence pancreatic mass. Spleen: Ill-defined low density in the spleen is slightly wedge-shaped in may reflect splenic infarct, splenic cleft more inferiorly. No perisplenic fluid. Adrenals/Urinary Tract: Normal adrenal glands. No hydronephrosis or perinephric edema. Homogeneous renal enhancement. Equivocal bladder wall thickening versus nondistention. Stomach/Bowel: Distal esophageal wall thickening with submucosal edema. Stomach is physiologically distended. Possible proximal small bowel wall thickening, with distal small bowel diffusely fluid-filled and prominent. Fluid/liquid stool in the ascending colon, possibly throughout the remainder the colon. Equivocal mild diffuse colonic wall thickening. No bowel obstruction. No mesenteric edema. Normal appendix. Vascular/Lymphatic: Mild aortic atherosclerosis. Circumaortic left renal vein. No enlarged abdominal or pelvic lymph nodes. Reproductive: Uterus is retroverted and heterogeneous, possible ill-defined anterior fibroid. No adnexal mass. Other: Possible small amount pelvic free fluid. No upper abdominal ascites. No free air or intra-abdominal abscess. Musculoskeletal: There are no  acute or suspicious osseous abnormalities. IMPRESSION: 1. Distal esophageal wall thickening, with mild bowel wall thickening of small bowel, and equivocal colonic wall thickening. Fluid-filled distal small bowel and proximal colon. Overall findings suggest esophagitis/enterocolitis, possibly infectious or inflammatory. Distal esophageal wall thickening could alternatively be secondary to esophagitis or reflux. 2. 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  No perisplenic fluid. 3. Patchy ground-glass and peribronchovascular opacities in the right greater than left lower lobe in the lung bases, favoring infectious or inflammatory etiology. 4. Cough with uterine fibroid. Electronically Signed   By: Rubye OaksMelanie  Ehinger M.D.   On: 05/13/2017 00:28   Dg Chest Port 1 View  Result Date: 05/13/2017 CLINICAL DATA:  Cough and congestion. EXAM: PORTABLE CHEST 1 VIEW COMPARISON:  Lung bases from abdominal CT earlier this day. Chest radiograph 05/10/2017 FINDINGS: The patchy ground-glass opacities in the lower lobes and parrot CT are not well seen radiographically. Normal heart size and mediastinal contours. No pleural effusion or pneumothorax. No pulmonary edema. Unchanged osseous structures. IMPRESSION: Lung base opacities on abdominal CT are not well seen radiographically. Electronically Signed   By: Rubye OaksMelanie  Ehinger M.D.   On: 05/13/2017 01:31    Impression:  1.  Nausea, vomiting, diarrhea.  Suspect infectious process. 2.  Abnormal CT scan, esophageal, small bowel +/- colonic thickening.  Likely infectious +/- reflux esophagitis. 3.  Poorly controlled diabetes; suspect patient has component of background gastroparesis on top of her more acute symptoms.  Plan:  1.  Supportive management:  IV fluids, antiemetics. 2.  Advance diet slowly. 3.  If symptoms don't improve, might consider endoscopy. 4.  [facility] will follow.   LOS: 0 days   Marvella Jenning M  05/13/2017, 2:52 PM  Cell [phone] If no answer or after 5 PM call  [phone]

## 2017-05-13 NOTE — Progress Notes (Signed)
Awaiting attending MD to round. Pt's CBG 182. Pt currently has insulin pump on. Pt wanted to cover glucose and reported that "I've covered in the emergency during the night." Pt covered CBG with pump -see MAR note. Pt alert, oriented, and able to negotiate and use pump properly yet pt verbalized understanding of the insulin pump policy at hospital. Forms being obtained and MD to round soon.

## 2017-05-13 NOTE — Progress Notes (Signed)
  Echocardiogram 2D Echocardiogram has been performed.  [name], [name] F 05/13/2017, 12:27 PM

## 2017-05-13 NOTE — Progress Notes (Signed)
Patient ID: Kristin BoozerHolly N Thornton, female   DOB: [birth year], 37 y.o.   MRN: [id number] Patient was admitted early this morning for nausea vomiting diarrhea.  Currently on intravenous fluids.  She is also on antibiotics for probable pneumonia seen on CAT scan of the abdomen and pelvis.  Cultures pending.  Patient seen and examined at bedside and plan of care discussed with her.  Medical records including this morning's H&P was reviewed by myself.  We will also add Protonix for the esophagitis.  Will try and reach out with the gastroenterologist on call regarding her CT findings.  Repeat a.m. labs.

## 2017-05-14 LAB — CBC WITH DIFFERENTIAL/PLATELET
Basophils Absolute: 0.1 10*3/uL (ref 0.0–0.1)
Basophils Relative: 1 %
EOS ABS: 0.3 10*3/uL (ref 0.0–0.7)
EOS PCT: 3 %
HCT: 36.3 % (ref 36.0–46.0)
Hemoglobin: 11.9 g/dL — ABNORMAL LOW (ref 12.0–15.0)
LYMPHS ABS: 4.4 10*3/uL — AB (ref 0.7–4.0)
Lymphocytes Relative: 42 %
MCH: 25.8 pg — AB (ref 26.0–34.0)
MCHC: 32.8 g/dL (ref 30.0–36.0)
MCV: 78.7 fL (ref 78.0–100.0)
MONO ABS: 0.7 10*3/uL (ref 0.1–1.0)
MONOS PCT: 7 %
Neutro Abs: 4.8 10*3/uL (ref 1.7–7.7)
Neutrophils Relative %: 47 %
Platelets: 278 10*3/uL (ref 150–400)
RBC: 4.61 MIL/uL (ref 3.87–5.11)
RDW: 15.3 % (ref 11.5–15.5)
WBC: 10.3 10*3/uL (ref 4.0–10.5)

## 2017-05-14 LAB — MAGNESIUM: Magnesium: 1.8 mg/dL (ref 1.7–2.4)

## 2017-05-14 LAB — COMPREHENSIVE METABOLIC PANEL WITH GFR
ALT: 11 U/L — ABNORMAL LOW (ref 14–54)
AST: 15 U/L (ref 15–41)
Albumin: 2.9 g/dL — ABNORMAL LOW (ref 3.5–5.0)
Alkaline Phosphatase: 64 U/L (ref 38–126)
Anion gap: 6 (ref 5–15)
BUN: 10 mg/dL (ref 6–20)
CO2: 21 mmol/L — ABNORMAL LOW (ref 22–32)
Calcium: 8.2 mg/dL — ABNORMAL LOW (ref 8.9–10.3)
Chloride: 108 mmol/L (ref 101–111)
Creatinine, Ser: 0.82 mg/dL (ref 0.44–1.00)
GFR calc Af Amer: >60 mL/min
GFR calc non Af Amer: >60 mL/min
Glucose, Bld: 176 mg/dL — ABNORMAL HIGH (ref 65–99)
Potassium: 3.7 mmol/L (ref 3.5–5.1)
Sodium: 135 mmol/L (ref 135–145)
Total Bilirubin: 0.8 mg/dL (ref 0.3–1.2)
Total Protein: 5.8 g/dL — ABNORMAL LOW (ref 6.5–8.1)

## 2017-05-14 LAB — GLUCOSE, CAPILLARY
GLUCOSE-CAPILLARY: 155 mg/dL — AB (ref 65–99)
GLUCOSE-CAPILLARY: 163 mg/dL — AB (ref 65–99)
GLUCOSE-CAPILLARY: 228 mg/dL — AB (ref 65–99)

## 2017-05-14 LAB — GC/CHLAMYDIA PROBE AMP (~~LOC~~) NOT AT ARMC
CHLAMYDIA, DNA PROBE: NEGATIVE
NEISSERIA GONORRHEA: NEGATIVE

## 2017-05-14 LAB — URINE CULTURE: Culture: NO GROWTH

## 2017-05-14 LAB — PROCALCITONIN: Procalcitonin: <0.1 ng/mL

## 2017-05-14 MED ORDER — PANTOPRAZOLE SODIUM 40 MG PO TBEC: 40.0000 mg | DELAYED_RELEASE_TABLET | Freq: Every day | ORAL | 0 refills | Status: DC

## 2017-05-14 MED ORDER — CEFUROXIME AXETIL 500 MG PO TABS: 500.0000 mg | ORAL_TABLET | Freq: Two times a day (BID) | ORAL | 0 refills | Status: AC

## 2017-05-14 MED ORDER — ONDANSETRON HCL 4 MG PO TABS: 4.0000 mg | ORAL_TABLET | Freq: Four times a day (QID) | ORAL | 0 refills | Status: DC | PRN

## 2017-05-14 NOTE — Discharge Summary (Signed)
Physician Discharge Summary  MIKA GRIFFITTS AOZ:[id number] DOB: 01/10/1981 DOA: 05/12/2017  PCP: Raynelle Jan, MD  Admit date: 05/12/2017 Discharge date: 05/14/2017  Admitted From: Home Disposition:  Home  Recommendations for Outpatient Follow-up:  1. Follow up with PCP in 1week 2. Follow-up with gastroenterology/Dr. Dulce Sellar in 6-8 weeks 3.  Home Health: No  Equipment/Devices: None  Discharge Condition: Stable CODE STATUS: Full Diet recommendation: Heart Healthy / Carb Modified    Brief/Interim Summary: 37 year old female with history of type 1 diabetes mellitus presented with nausea, vomiting, diarrhea.  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  She was started on IV fluids and antibiotics.  GI evaluated the patient and recommended conservative management.  Her condition has improved.  She is tolerating diet.  2D echo was unremarkable.  She will be discharged home on oral Protonix and oral antibiotics with outpatient follow-up with GI.  Discharge Diagnoses:  Principal Problem:   Gastroenteritis Active Problems:   Uncontrolled type 1 diabetes mellitus (HCC)   Hypokalemia   Hypertensive urgency   Vomiting and diarrhea   CAP (community acquired pneumonia)   Diarrhea with dehydration   1.   Nausea, vomiting and diarrhea with dehydration  -Question of viral etiology - Improved.  Tolerating diet.  GI evaluation appreciated.  Continue empiric oral Protonix for 2 weeks at least.  Outpatient follow-up with primary care provider.  Outpatient follow-up with GI in 6-8 weeks.   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 -No more diarrhea since admission.  Presents with 5 days of N/V/D, no significant pain and no fevers    2. Type I DM - A1c was 15.7% in 2016 with no more recent values available  - Managed at home with pump; currently on insulin sliding scale coverage -Continue insulin pump at home.  Outpatient follow-up with primary care provider and or endocrinology  3. Pneumonia  - Unclear if it is bacterial/viral. - CT abd/pelvis with patchy opacities in bases, R>L, suggestive of infection  - Currently on Rocephin and azithromycin  -Cultures negative.  Respiratory status stable -Discharge home on cefuroxime for 5 more days  4. Hypertensive urgency  - BP 195/95 in ED in setting of nausea and vomiting  - Improved.  Continue lisinopril  5. Hypokalemia  - Resolved     Discharge Instructions  Discharge Instructions    Call MD for:  difficulty breathing, headache or visual disturbances   Complete by:  As directed    Call MD for:  extreme fatigue   Complete by:  As directed    Call MD for:  hives   Complete by:  As directed    Call MD for:  persistant dizziness or light-headedness   Complete by:  As directed    Call MD for:  persistant nausea and vomiting   Complete by:  As directed    Call MD for:  severe uncontrolled pain   Complete by:  As directed    Call MD for:  temperature >100.4   Complete by:  As directed    Diet - low sodium heart healthy   Complete by:  As  directed    Diet Carb Modified   Complete by:  As directed    Increase activity slowly   Complete by:  As directed      Allergies as of 05/14/2017   No Known Allergies     Medication List    TAKE these medications   accu-chek multiclix lancets Use as instructed   cefUROXime 500 MG tablet Commonly known as:  CEFTIN Take 1 tablet (500 mg total) by mouth 2 (two) times daily for 5 days.   insulin lispro 100 UNIT/ML  injection Commonly known as:  HUMALOG Inject 7-10 Units into the skin continuous. Uses daytronic pump for a continuous 7-10 units a day   lisinopril 2.5 MG tablet Commonly known as:  PRINIVIL,ZESTRIL Take 2.5 mg by mouth daily.   ondansetron 4 MG tablet Commonly known as:  ZOFRAN Take 1 tablet (4 mg total) by mouth every 6 (six) hours as needed for nausea.   pantoprazole 40 MG tablet Commonly known as:  PROTONIX Take 1 tablet (40 mg total) by mouth daily.   sertraline 100 MG tablet Commonly known as:  ZOLOFT Take 100 mg by mouth daily.      Follow-up Information    Raynelle Janoletta, Harry M, MD. Schedule an appointment as soon as possible for a visit in 1 week(s).   Specialty:  Family Medicine Contact information: [address]  [place], [place] [id number] [place] [id number] [phone]        Willis Modenautlaw, William, MD Follow up.   Specialty:  Gastroenterology Why:  Call [phone] for an appointment in the next 6 to 8 weeks Contact information: [address] [place] [address] [phone]          No Known Allergies  Consultations:  GI   Procedures/Studies: Ct Abdomen Pelvis W Contrast  Result Date: 05/13/2017 CLINICAL DATA:  Nausea and vomiting. EXAM: CT ABDOMEN AND PELVIS WITH CONTRAST TECHNIQUE: Multidetector CT imaging of the abdomen and pelvis was performed using the standard protocol following bolus administration of intravenous contrast. CONTRAST:  100mL ISOVUE-300 IOPAMIDOL (ISOVUE-300) INJECTION 61% COMPARISON:  Radiographs 05/10/2017.  CT 12/12/2009 FINDINGS: Lower chest: Patchy ground-glass opacities and peribronchovascular opacities in the right lower lobes. Mild right lower lobe bronchial thickening were visualized. Similar ground-glass opacities in the left lower lobe to a lesser extent Hepatobiliary: No focal hepatic lesion. Mild focal fatty infiltration adjacent with falciform ligament. Gallbladder physiologically distended, no calcified  stone. No biliary dilatation. Pancreas: Pancreatic ductal prominence of 4 mm without peripancreatic inflammation. No evidence pancreatic mass. Spleen: Ill-defined low density in the spleen is slightly wedge-shaped in may reflect splenic infarct, splenic cleft more inferiorly. No perisplenic fluid. Adrenals/Urinary Tract: Normal adrenal glands. No hydronephrosis or perinephric edema. Homogeneous renal enhancement. Equivocal bladder wall thickening versus nondistention. Stomach/Bowel: Distal esophageal wall thickening with submucosal edema. Stomach is physiologically distended. Possible proximal small bowel wall thickening, with distal small bowel diffusely fluid-filled and prominent. Fluid/liquid stool in the ascending colon, possibly throughout the remainder the colon. Equivocal mild diffuse colonic wall thickening. No bowel obstruction. No mesenteric edema. Normal appendix. Vascular/Lymphatic: Mild aortic atherosclerosis. Circumaortic left renal vein. No enlarged abdominal or pelvic lymph nodes. Reproductive: Uterus is retroverted and heterogeneous, possible ill-defined anterior fibroid. No adnexal mass. Other: Possible small amount pelvic free fluid. No upper abdominal ascites. No free air or intra-abdominal abscess. Musculoskeletal: There are no acute or suspicious osseous abnormalities. IMPRESSION: 1. Distal esophageal wall thickening, with mild bowel wall thickening of small bowel, and equivocal  colonic wall thickening. Fluid-filled distal small bowel and proximal colon. Overall findings suggest esophagitis/enterocolitis, possibly infectious or inflammatory. Distal esophageal wall thickening could alternatively be secondary to esophagitis or reflux. 2. 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  No perisplenic fluid. 3. Patchy ground-glass and peribronchovascular opacities in the right greater than left lower lobe in the lung bases, favoring infectious or inflammatory etiology. 4. Cough with uterine fibroid. Electronically  Signed   By: Rubye Oaks M.D.   On: 05/13/2017 00:28   Dg Chest Port 1 View  Result Date: 05/13/2017 CLINICAL DATA:  Cough and congestion. EXAM: PORTABLE CHEST 1 VIEW COMPARISON:  Lung bases from abdominal CT earlier this day. Chest radiograph 05/10/2017 FINDINGS: The patchy ground-glass opacities in the lower lobes and parrot CT are not well seen radiographically. Normal heart size and mediastinal contours. No pleural effusion or pneumothorax. No pulmonary edema. Unchanged osseous structures. IMPRESSION: Lung base opacities on abdominal CT are not well seen radiographically. Electronically Signed   By: Rubye Oaks M.D.   On: 05/13/2017 01:31   Dg Abd Acute W/chest  Result Date: 05/10/2017 CLINICAL DATA:  Nausea.  Vomiting. EXAM: DG ABDOMEN ACUTE W/ 1V CHEST COMPARISON:  July 30, 2014 chest x-ray FINDINGS: The heart, hila, and mediastinum are normal. No pneumothorax. No pulmonary nodules, masses, or focal infiltrates. No free air, portal venous gas, or pneumatosis. No renal stones identified. There is a paucity of bowel gas which limits evaluation of the bowel. The visualized bones are normal. No ureteral or bladder stones identified. IMPRESSION: 1. Normal chest. 2. There is a complete lack of bowel gas which limits evaluation of the bowel. 3. No other abnormalities. Electronically Signed   By: Gerome Sam III M.D   On: 05/10/2017 17:36      Subjective: Patient seen and examined at bedside.  She feels much better.  She is tolerating her diet.  She wants to go home.  No overnight fever or vomiting.  Discharge Exam: Vitals:   05/13/17 2028 05/14/17 0423  BP: (!) 173/74 (!) 142/74  Pulse: 65 63  Resp: 18 18  Temp: 99.5 F (37.5 C) 99.3 F (37.4 C)  SpO2: 100% 97%   Vitals:   05/13/17 1800 05/13/17 2028 05/14/17 0423 05/14/17 0613  BP: (!) 158/74 (!) 173/74 (!) 142/74   Pulse: 64 65 63   Resp: 14 18 18    Temp: 99.5 F (37.5 C) 99.5 F (37.5 C) 99.3 F (37.4 C)   TempSrc:  Oral Oral Oral   SpO2: 100% 100% 97%   Weight:   69 kg (152 lb 1.9 oz)   Height:    5\' 4"  (1.626 m)    General: Pt is alert, awake, not in acute distress Cardiovascular: Rate controlled, S1/S2 + Respiratory: Bilateral decreased breath sounds at bases Abdominal: Soft, NT, ND, bowel sounds + Extremities: no edema, no cyanosis    The results of significant diagnostics from this hospitalization (including imaging, microbiology, ancillary and laboratory) are listed below for reference.     Microbiology: Recent Results (from the past 240 hour(s))  Wet prep, genital     Status: Abnormal   Collection Time: 05/12/17 11:00 PM  Result Value Ref Range Status   Yeast Wet Prep HPF POC NONE SEEN NONE SEEN Final   Trich, Wet Prep NONE SEEN NONE SEEN Final   Clue Cells Wet Prep HPF POC NONE SEEN NONE SEEN Final   WBC, Wet Prep HPF POC MODERATE (A) NONE SEEN Final   Sperm NONE SEEN  Final  Urine culture     Status: None   Collection Time: 05/13/17  3:40 AM  Result Value Ref Range Status   Specimen Description URINE, CLEAN CATCH  Final   Special Requests NONE  Final   Culture   Final    NO GROWTH Performed at [facility], [address] [place], [place] [address]    Report Status 05/14/2017 FINAL  Final  Culture, sputum-assessment     Status: None   Collection Time: 05/13/17  5:05 AM  Result Value Ref Range Status   Specimen Description SPUTUM  Final   Special Requests NONE  Final   Sputum evaluation THIS SPECIMEN IS ACCEPTABLE FOR SPUTUM CULTURE  Final   Report Status 05/13/2017 FINAL  Final  Culture, respiratory (NON-Expectorated)     Status: None (Preliminary result)   Collection Time: 05/13/17  5:05 AM  Result Value Ref Range Status   Specimen Description SPUTUM  Final   Special Requests NONE Reflexed from [id number]  Final   Gram Stain   Final    WBC PRESENT,BOTH PMN AND MONONUCLEAR MODERATE SQUAMOUS EPITHELIAL CELLS PRESENT FEW GRAM POSITIVE COCCI IN CHAINS    Culture    Final    CULTURE REINCUBATED FOR BETTER GROWTH Performed at [facility], [address], [place], [place] [address]    Report Status PENDING  Incomplete     Labs: BNP (last 3 results) No results for input(s): BNP in the last 8760 hours. Basic Metabolic Panel: Recent Labs  Lab 05/10/17 1359 05/10/17 2033 05/12/17 2150 05/13/17 0445 05/14/17 0520  NA 141 141 136 138 135  K 4.4 5.6* 3.4* 3.6 3.7  CL 104 106 102 109 108  CO2 23  --  23 21* 21*  GLUCOSE 295* 293* 203* 115* 176*  BUN 37* 50* 24* 19 10  CREATININE 1.32* 1.10* 0.92 0.75 0.82  CALCIUM 9.4  --  9.0 7.7* 8.2*  MG  --   --   --   --  1.8   Liver Function Tests: Recent Labs  Lab 05/10/17 1359 05/12/17 2150 05/14/17 0520  AST 21 23 15   ALT 14 14 11*  ALKPHOS 86 84 64  BILITOT 0.9 1.1 0.8  PROT 7.5 7.9 5.8*  ALBUMIN 4.0 3.8 2.9*   Recent Labs  Lab 05/10/17 1359 05/12/17 2150  LIPASE 26 31   No results for input(s): AMMONIA in the last 168 hours. CBC: Recent Labs  Lab 05/10/17 1359 05/10/17 2033 05/12/17 2150 05/13/17 0445 05/14/17 0520  WBC 13.7*  --  17.7* 15.6* 10.3  NEUTROABS  --   --   --  10.3* 4.8  HGB 13.3 12.2 14.2 12.2 11.9*  HCT 39.6 36.0 42.2 37.5 36.3  MCV 78.9  --  78.4 79.1 78.7  PLT 374  --  374 322 278   Cardiac Enzymes: No results for input(s): CKTOTAL, CKMB, CKMBINDEX, TROPONINI in the last 168 hours. BNP: Invalid input(s): POCBNP CBG: Recent Labs  Lab 05/13/17 1639 05/13/17 2027 05/14/17 0026 05/14/17 0421 05/14/17 0744  GLUCAP 225* 255* 228* 155* 163*   D-Dimer No results for input(s): DDIMER in the last 72 hours. Hgb A1c Recent Labs    05/13/17 0445  HGBA1C 9.6*   Lipid Profile No results for input(s): CHOL, HDL, LDLCALC, TRIG, CHOLHDL, LDLDIRECT in the last 72 hours. Thyroid function studies No results for input(s): TSH, T4TOTAL, T3FREE, THYROIDAB in the last 72 hours.  Invalid input(s): FREET3 Anemia work up No results for input(s):  VITAMINB12,  FOLATE, FERRITIN, TIBC, IRON, RETICCTPCT in the last 72 hours. Urinalysis    Component Value Date/Time   COLORURINE AMBER (A) 05/12/2017 2243   APPEARANCEUR HAZY (A) 05/12/2017 2243   LABSPEC 1.036 (H) 05/12/2017 2243   PHURINE 5.0 05/12/2017 2243   GLUCOSEU >=500 (A) 05/12/2017 2243   HGBUR LARGE (A) 05/12/2017 2243   BILIRUBINUR NEGATIVE 05/12/2017 2243   KETONESUR 80 (A) 05/12/2017 2243   PROTEINUR >=300 (A) 05/12/2017 2243   UROBILINOGEN 1.0 07/30/2014 1241   NITRITE NEGATIVE 05/12/2017 2243   LEUKOCYTESUR NEGATIVE 05/12/2017 2243   Sepsis Labs Invalid input(s): PROCALCITONIN,  WBC,  LACTICIDVEN Microbiology Recent Results (from the past 240 hour(s))  Wet prep, genital     Status: Abnormal   Collection Time: 05/12/17 11:00 PM  Result Value Ref Range Status   Yeast Wet Prep HPF POC NONE SEEN NONE SEEN Final   Trich, Wet Prep NONE SEEN NONE SEEN Final   Clue Cells Wet Prep HPF POC NONE SEEN NONE SEEN Final   WBC, Wet Prep HPF POC MODERATE (A) NONE SEEN Final   Sperm NONE SEEN  Final  Urine culture     Status: None   Collection Time: 05/13/17  3:40 AM  Result Value Ref Range Status   Specimen Description URINE, CLEAN CATCH  Final   Special Requests NONE  Final   Culture   Final    NO GROWTH Performed at [facility], [address] [place], [place] [address]    Report Status 05/14/2017 FINAL  Final  Culture, sputum-assessment     Status: None   Collection Time: 05/13/17  5:05 AM  Result Value Ref Range Status   Specimen Description SPUTUM  Final   Special Requests NONE  Final   Sputum evaluation THIS SPECIMEN IS ACCEPTABLE FOR SPUTUM CULTURE  Final   Report Status 05/13/2017 FINAL  Final  Culture, respiratory (NON-Expectorated)     Status: None (Preliminary result)   Collection Time: 05/13/17  5:05 AM  Result Value Ref Range Status   Specimen Description SPUTUM  Final   Special Requests NONE Reflexed from [id number]  Final   Gram Stain   Final    WBC  PRESENT,BOTH PMN AND MONONUCLEAR MODERATE SQUAMOUS EPITHELIAL CELLS PRESENT FEW GRAM POSITIVE COCCI IN CHAINS    Culture   Final    CULTURE REINCUBATED FOR BETTER GROWTH Performed at [facility], [address] [place], [place] [address]    Report Status PENDING  Incomplete     Time coordinating discharge: 35 minutes  SIGNED:   Glade Lloyd, MD  [facility] 05/14/2017, 10:19 AM Pager: [phone]  If 7PM-7AM, please contact night-coverage [email or web address] Password [email or web address]

## 2017-05-14 NOTE — Progress Notes (Signed)
Pt alert and oriented. Tolerating diet.  No complaints of diarrhea today.  D/C instructions were given. All questions answered.  Pt d/cd home with spouse.

## 2017-05-15 LAB — CULTURE, RESPIRATORY: CULTURE: NORMAL

## 2017-05-15 LAB — CULTURE, RESPIRATORY W GRAM STAIN

## 2017-05-18 LAB — CULTURE, BLOOD (ROUTINE X 2)
CULTURE: NO GROWTH
CULTURE: NO GROWTH
SPECIAL REQUESTS: ADEQUATE
Special Requests: ADEQUATE

## 2018-04-23 ENCOUNTER — Emergency Department (HOSPITAL_COMMUNITY)
Admission: EM | Admit: 2018-04-23 | Discharge: 2018-04-24 | Disposition: A | Discharge status: Home / Self Care | Payer: BLUE CROSS/BLUE SHIELD | Attending: Emergency Medicine | Admitting: Emergency Medicine

## 2018-04-23 ENCOUNTER — Encounter (HOSPITAL_COMMUNITY): Payer: Self-pay | Admitting: Emergency Medicine

## 2018-04-23 ENCOUNTER — Other Ambulatory Visit: Payer: Self-pay

## 2018-04-23 DIAGNOSIS — R112 Nausea with vomiting, unspecified: Secondary | ICD-10-CM

## 2018-04-23 DIAGNOSIS — E860 Dehydration: Secondary | ICD-10-CM | POA: Insufficient documentation

## 2018-04-23 DIAGNOSIS — Z209 Contact with and (suspected) exposure to unspecified communicable disease: Secondary | ICD-10-CM | POA: Diagnosis not present

## 2018-04-23 DIAGNOSIS — R197 Diarrhea, unspecified: Secondary | ICD-10-CM | POA: Diagnosis not present

## 2018-04-23 DIAGNOSIS — F17210 Nicotine dependence, cigarettes, uncomplicated: Secondary | ICD-10-CM | POA: Insufficient documentation

## 2018-04-23 DIAGNOSIS — Z9641 Presence of insulin pump (external) (internal): Secondary | ICD-10-CM | POA: Diagnosis not present

## 2018-04-23 DIAGNOSIS — R739 Hyperglycemia, unspecified: Secondary | ICD-10-CM

## 2018-04-23 DIAGNOSIS — E1065 Type 1 diabetes mellitus with hyperglycemia: Secondary | ICD-10-CM | POA: Insufficient documentation

## 2018-04-23 DIAGNOSIS — Z79899 Other long term (current) drug therapy: Secondary | ICD-10-CM | POA: Insufficient documentation

## 2018-04-23 LAB — COMPREHENSIVE METABOLIC PANEL
ALT: 11 U/L (ref 0–44)
AST: 16 U/L (ref 15–41)
Albumin: 4.1 g/dL (ref 3.5–5.0)
Alkaline Phosphatase: 82 U/L (ref 38–126)
Anion gap: 16 — ABNORMAL HIGH (ref 5–15)
BILIRUBIN TOTAL: 0.9 mg/dL (ref 0.3–1.2)
BUN: 31 mg/dL — AB (ref 6–20)
CHLORIDE: 102 mmol/L (ref 98–111)
CO2: 23 mmol/L (ref 22–32)
CREATININE: 1.32 mg/dL — AB (ref 0.44–1.00)
Calcium: 9.4 mg/dL (ref 8.9–10.3)
GFR, EST AFRICAN AMERICAN: 59 mL/min — AB (ref >60)
GFR, EST NON AFRICAN AMERICAN: 51 mL/min — AB (ref >60)
Glucose, Bld: 429 mg/dL — ABNORMAL HIGH (ref 70–99)
Potassium: 3.4 mmol/L — ABNORMAL LOW (ref 3.5–5.1)
Sodium: 141 mmol/L (ref 135–145)
TOTAL PROTEIN: 7.6 g/dL (ref 6.5–8.1)

## 2018-04-23 LAB — I-STAT BETA HCG BLOOD, ED (MC, WL, AP ONLY): I-stat hCG, quantitative: 17.1 m[IU]/mL — ABNORMAL HIGH (ref <5)

## 2018-04-23 LAB — CBC
HCT: 41.6 % (ref 36.0–46.0)
HEMOGLOBIN: 13 g/dL (ref 12.0–15.0)
MCH: 25.2 pg — AB (ref 26.0–34.0)
MCHC: 31.3 g/dL (ref 30.0–36.0)
MCV: 80.6 fL (ref 80.0–100.0)
Platelets: 420 10*3/uL — ABNORMAL HIGH (ref 150–400)
RBC: 5.16 MIL/uL — AB (ref 3.87–5.11)
RDW: 15.5 % (ref 11.5–15.5)
WBC: 18.1 10*3/uL — AB (ref 4.0–10.5)
nRBC: 0 % (ref 0.0–0.2)

## 2018-04-23 LAB — HCG, QUANTITATIVE, PREGNANCY: hCG, Beta Chain, Quant, S: <1 m[IU]/mL (ref <5)

## 2018-04-23 LAB — LIPASE, BLOOD: LIPASE: 25 U/L (ref 11–51)

## 2018-04-23 MED ORDER — SODIUM CHLORIDE 0.9 % IV BOLUS
1000.0000 mL | Freq: Once | INTRAVENOUS | Status: AC
  Administered 2018-04-23: 1000 mL via INTRAVENOUS

## 2018-04-23 MED ORDER — PROMETHAZINE HCL 25 MG/ML IJ SOLN
25.0000 mg | Freq: Once | INTRAMUSCULAR | Status: AC
  Administered 2018-04-23: 25 mg via INTRAVENOUS
  Filled 2018-04-23: qty 1

## 2018-04-23 MED ORDER — ONDANSETRON HCL 4 MG/2ML IJ SOLN: 4.0000 mg | Freq: Once | INTRAMUSCULAR | Status: DC

## 2018-04-23 MED ORDER — SODIUM CHLORIDE 0.9 % IV BOLUS
1000.0000 mL | Freq: Once | INTRAVENOUS | Status: AC
  Administered 2018-04-24: 1000 mL via INTRAVENOUS

## 2018-04-23 NOTE — ED Triage Notes (Signed)
Pt having n/v/d since yesterday. CBG couple hours ago was in 300s and took 4 units. Reports when symptoms get this bad normally ends up in DKA. Phenergan 12.5 mg IM at UC around 1130 am today. UC attempted to give IV fluids but couldn't get IV access due to too dehydrated.

## 2018-04-23 NOTE — ED Notes (Signed)
Pt has been reminded that a urine sample was needed. Visitors stated to Clinical research associatewriter: "She can't pee. She hasn't drank anything in 24-48 hrs". Maralyn SagoSarah, RN aware.

## 2018-04-23 NOTE — ED Notes (Signed)
Pt is still unable to give urine sample.

## 2018-04-23 NOTE — ED Notes (Signed)
Pt aware that urine sample is needed. Pt unable to provide urine at this time

## 2018-04-23 NOTE — ED Notes (Signed)
PA at bedside.

## 2018-04-23 NOTE — ED Provider Notes (Signed)
[facility]   CSN: [id number] Arrival date & time: 04/23/18  1540     History   Chief Complaint Chief Complaint  Patient presents with  . Emesis  . Diarrhea    HPI [name] is a 37 y.o. female with h/o T1DM with insulin pump is here for evaluation of nausea, vomiting, diarrhea onset 2 days ago. Sudden, persistent. Pt went to UC where she got phenergan IM which did not help.  Husband states they couldn't give her fluids because they couldn't start an IV because she was dehydrated. Husband is concerned she may be getting close to DKA because her CBGs have been in the 300-400 range. States they have been adjusting her insulin doses to keep CBGs under control. 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  She tested negative for flu. No interventions. No alleviating or aggravating factors. No fevers, chills, CP, cough, abdominal pain, blood in stool, melena, dysuria, hematuria.  No abdominal surgeries. No recent antibiotics, travel, exposure to suspicious foods.  HPI  Past Medical History:  Diagnosis Date  . Diabetes mellitus without complication [facility])     Patient Active Problem List   Diagnosis Date Noted  . Gastroenteritis 05/13/2017  . Hypertensive urgency 05/13/2017  . Vomiting and diarrhea 05/13/2017  . CAP (community acquired pneumonia) 05/13/2017  . Diarrhea with dehydration 05/13/2017  . Hypokalemia 08/01/2014  . Anemia 08/01/2014  . DKA, type 1 (HCC) 07/30/2014  . Uncontrolled type 1 diabetes mellitus (HCC) 07/30/2014  . Tobacco abuse 07/30/2014  . Hyperkalemia 07/30/2014  . Leucocytosis 07/30/2014  . Acute kidney injury (HCC) 07/30/2014    Past Surgical History:  Procedure Laterality Date  . UTERINE FIBROID SURGERY       OB History   No obstetric history on file.      Home Medications    Prior to Admission medications   Medication Sig Start  Date End Date Taking? Authorizing Provider  insulin lispro (HUMALOG) 100 UNIT/ML injection Inject 7-10 Units into the skin continuous. Uses daytronic pump   Yes [provider]  lisinopril (PRINIVIL,ZESTRIL) 2.5 MG tablet Take 2.5 mg by mouth daily.   Yes [provider]  pregabalin (LYRICA) 150 MG capsule Take 150 mg by mouth 3 (three) times daily.   Yes [provider]  sertraline (ZOLOFT) 100 MG tablet Take 100 mg by mouth daily.   Yes [provider]  Lancets (ACCU-CHEK MULTICLIX) lancets Use as instructed 08/01/14   Dhungel, Nishant, MD  ondansetron (ZOFRAN) 4 MG tablet Take 1 tablet (4 mg total) by mouth every 6 (six) hours as needed for nausea. Patient not taking: Reported on 04/23/2018 05/14/17   Glade Lloyd, MD  pantoprazole (PROTONIX) 40 MG tablet Take 1 tablet (40 mg total) by mouth daily. Patient not taking: Reported on 04/23/2018 05/14/17   Glade Lloyd, MD  promethazine (PHENERGAN) 25 MG tablet Take 1 tablet (25 mg total) by mouth every 6 (six) hours as needed for nausea or vomiting. 04/24/18   Liberty Handy, PA-C    Family History No family history on file.  Social History Social History   Tobacco Use  . Smoking status: Current Every Day Smoker    Types: Cigarettes  . Smokeless tobacco: Current User  Substance Use Topics  . Alcohol use: No  . Drug use: Not on file     Allergies   Patient has no known allergies.  Review of Systems Review of Systems  Gastrointestinal: Positive for diarrhea, nausea and vomiting.  All other systems reviewed and are negative.    Physical Exam Updated Vital Signs BP (!) 141/62 (BP Location: Right Arm)   Pulse 78   Temp 99.2 F (37.3 C) (Oral)   Resp 12   Ht 5\' 3"  (1.6 m)   Wt 64.6 kg   LMP 04/16/2018   SpO2 97%   BMI 25.24 kg/m   Physical Exam Vitals signs and nursing note reviewed.  Constitutional:      Appearance: She is well-developed.     Comments: Non toxic  HENT:      Head: Normocephalic and atraumatic.     Nose: Nose normal.     Mouth/Throat:     Comments: Dry lips but MMM  Eyes:     Conjunctiva/sclera: Conjunctivae normal.     Pupils: Pupils are equal, round, and reactive to light.  Neck:     Musculoskeletal: Normal range of motion.  Cardiovascular:     Rate and Rhythm: Normal rate and regular rhythm.  Pulmonary:     Effort: Pulmonary effort is normal.     Breath sounds: Normal breath sounds.  Abdominal:     General: Bowel sounds are normal.     Palpations: Abdomen is soft.     Tenderness: There is no abdominal tenderness.     Comments: No G/R/R. No suprapubic or CVA tenderness. Negative Murphy's and McBurney's. Active BS to lower quadrants.   Musculoskeletal: Normal range of motion.  Skin:    General: Skin is warm and dry.     Capillary Refill: Capillary refill takes less than 2 seconds.  Neurological:     Mental Status: She is alert and oriented to person, place, and time.  Psychiatric:        Behavior: Behavior normal.      ED Treatments / Results  Labs (all labs ordered are listed, but only abnormal results are displayed) Labs Reviewed  COMPREHENSIVE METABOLIC PANEL - Abnormal; Notable for the following components:      Result Value   Potassium 3.4 (*)    Glucose, Bld 429 (*)    BUN 31 (*)    Creatinine, Ser 1.32 (*)    GFR calc non Af Amer 51 (*)    GFR calc Af Amer 59 (*)    Anion gap 16 (*)    All other components within normal limits  CBC - Abnormal; Notable for the following components:   WBC 18.1 (*)    RBC 5.16 (*)    MCH 25.2 (*)    Platelets 420 (*)    All other components within normal limits  I-STAT BETA HCG BLOOD, ED ([facility], [facility], [facility] ONLY) - Abnormal; Notable for the following components:   I-stat hCG, quantitative 17.1 (*)    All other components within normal limits  LIPASE, BLOOD  HCG, QUANTITATIVE, PREGNANCY  URINALYSIS, ROUTINE W REFLEX MICROSCOPIC  BASIC METABOLIC PANEL  POC URINE PREG, ED     EKG None  Radiology No results found.  Procedures Procedures (including critical care time)  Medications Ordered in ED Medications  sodium chloride 0.9 % bolus 1,000 mL (has no administration in time range)  sodium chloride 0.9 % bolus 1,000 mL (1,000 mLs Intravenous New Bag/Given 04/23/18 2259)  promethazine (PHENERGAN) injection 25 mg (25 mg Intravenous Given 04/23/18 2259)     Initial Impression / Assessment and Plan / ED Course  I have reviewed the triage vital signs  and the nursing notes.  Pertinent labs & imaging results that were available during my care of the patient were reviewed by me and considered in my medical decision making (see chart for details).  Clinical Course as of Apr 25 7  Tue Apr 23, 2018  2215 WBC(!): 18.1 [CG]  2215 Glucose(!): 429 [CG]  2215 Anion gap(!): 16 [CG]  2215 Creatinine(!): 1.32 [CG]  2215 I-stat hCG, quantitative(!): 17.1 [CG]    Clinical Course User Index [CG] Liberty Handy, PA-C   I suspect a viral gastroenteritis leading to dehydration.  She is afebrile, no abdominal tenderness.  No signs of significant dehydration on exam.  Known sick contacts. Repeat abdominal exam x3 is reassuring with no tenderness.  She has no urinary symptoms, suprapubic or CVA tenderness and I doubt pyelonephritis or UTI.  I do not see indication for further emergent imaging today.  Final Clinical Impressions(s) / ED Diagnoses   0005: WBC 18.1 however afebrile without tachycardia.  Favoring this may be from stress response versus dehydration.  Creatinine 1.32.  Electrolytes WNL.  Glucose 429 with minimal anion gap at 16 but normal bicarb.  This is not consistent with DKA/HHS. We will repeat BMP as patient has been here for more than 8 hours.  Patient will be handed off to oncoming ED PA who will follow-up on BMP.  Patient is tolerating fluid challenge.  IV fluids running.  I anticipate discharge with symptomatic management of viral process.  Discussed  plan with patient and husband and they are comfortable with this. Final diagnoses:  Nausea vomiting and diarrhea  Dehydration  Hyperglycemia    ED Discharge Orders         Ordered    promethazine (PHENERGAN) 25 MG tablet  Every 6 hours PRN     04/24/18 0008           Liberty Handy, PA-C 04/24/18 0008    Arby Barrette, MD 04/24/18 [phone]

## 2018-04-23 NOTE — ED Notes (Signed)
Pt actively vomiting in EMT cubby.

## 2018-04-23 NOTE — ED Notes (Signed)
Patient in the bathroom at this time. 

## 2018-04-23 NOTE — ED Provider Notes (Signed)
Patient care signed out at end of shift by Sharen Hecklaudia Gibbons, PA-C. Sxs of GE N, V, D x 2 days. No fever, no pain Similar symptoms at work H/o T1DM, poor control No DKA  Plan: Pending repeat BMP; IVF's (getting 2 liters); PO challenge  Repeat BMEt results reviewed and discussed with Dr. Donnald GarrePfeiffer. Recheck of the patient finds her tolerating PO fluids without further vomiting. She is felt appropriate for discharge home with Rx Phenergan and PCP follow up per plan of previous treatment team. All questions/concerns of patient and spouse are addressed.    Elpidio AnisUpstill, Breydon Senters, PA-C 04/24/18 [id number]    Arby BarrettePfeiffer, Marcy, MD 04/24/18 [phone]

## 2018-04-23 NOTE — ED Notes (Signed)
Pt notified UA needed

## 2018-04-24 LAB — BASIC METABOLIC PANEL
Anion gap: 17 — ABNORMAL HIGH (ref 5–15)
BUN: 39 mg/dL — ABNORMAL HIGH (ref 6–20)
CO2: 25 mmol/L (ref 22–32)
Calcium: 9.4 mg/dL (ref 8.9–10.3)
Chloride: 102 mmol/L (ref 98–111)
Creatinine, Ser: 1.54 mg/dL — ABNORMAL HIGH (ref 0.44–1.00)
GFR calc non Af Amer: 42 mL/min — ABNORMAL LOW (ref >60)
GFR, EST AFRICAN AMERICAN: 49 mL/min — AB (ref >60)
Glucose, Bld: 379 mg/dL — ABNORMAL HIGH (ref 70–99)
Potassium: 3.2 mmol/L — ABNORMAL LOW (ref 3.5–5.1)
Sodium: 144 mmol/L (ref 135–145)

## 2018-04-24 LAB — CBG MONITORING, ED: GLUCOSE-CAPILLARY: 293 mg/dL — AB (ref 70–99)

## 2018-04-24 MED ORDER — PROMETHAZINE HCL 25 MG PO TABS: 25.0000 mg | ORAL_TABLET | Freq: Four times a day (QID) | ORAL | 0 refills | Status: DC | PRN

## 2018-04-24 NOTE — ED Notes (Signed)
Pt given fluids and encouraged to drink. 

## 2018-04-24 NOTE — ED Notes (Signed)
Pt and visitor requested pt's CBG be checked. Maralyn SagoSarah, RN notified

## 2018-04-24 NOTE — ED Notes (Signed)
Pt and husband verbalized discharge instructions and follow up care. Husband driving her home.

## 2018-04-24 NOTE — Discharge Instructions (Signed)
You were seen in the ER for nausea, vomiting, diarrhea.  Work up in the emergency department was reassuring.  No signs of DKA.  I suspect your symptoms are due to a viral infection. Symptoms of uncomplicated viral gastrointestinal infections usually last 24-48 hours, and slowly improve after 2-3 days.    Treatment includes symptoms control, oral hydration, prevention of spread of illness.   Promethazine (phenergan) for nausea every 6 hours for the next 24 hours to avoid vomiting and tolerate fluids.  Acetaminophen (tylenol) for body aches, abdominal pain. Ibuprofen (advil, aleve) can be irritating to stomach, avoid these until you feel better.  Stay well hydrated with 1-2 L of water daily.  Start with bland, mild diet avoiding hot, spicy, greasy foods until you start to feel better and then can transition to normal diet. Avoid fruit or fruit juices as these can worsen diarrhea.  Norovirus and other stomach bugs are killed by alcohol and standard cleaning agents. Wash hands often and do not share utensils or drinks to avoid spread to family members.  Return for constant abdominal pain, inability to tolerating fluids by mouth despite nausea medication, localized abdominal pain, blood in vomit or stool, fever, elevated glucose levels.

## 2018-04-24 NOTE — ED Notes (Signed)
Sarah, RN notified of CBG results.

## 2019-03-19 IMAGING — DX DG CHEST 1V PORT
1 series · 1 of 1 positions shown · non-contrast
Comparison: Lung bases from abdominal CT earlier this day. Chest
radiograph 05/10/2017

CLINICAL DATA: Cough and congestion.

EXAM:
PORTABLE CHEST 1 VIEW

[chest ap]
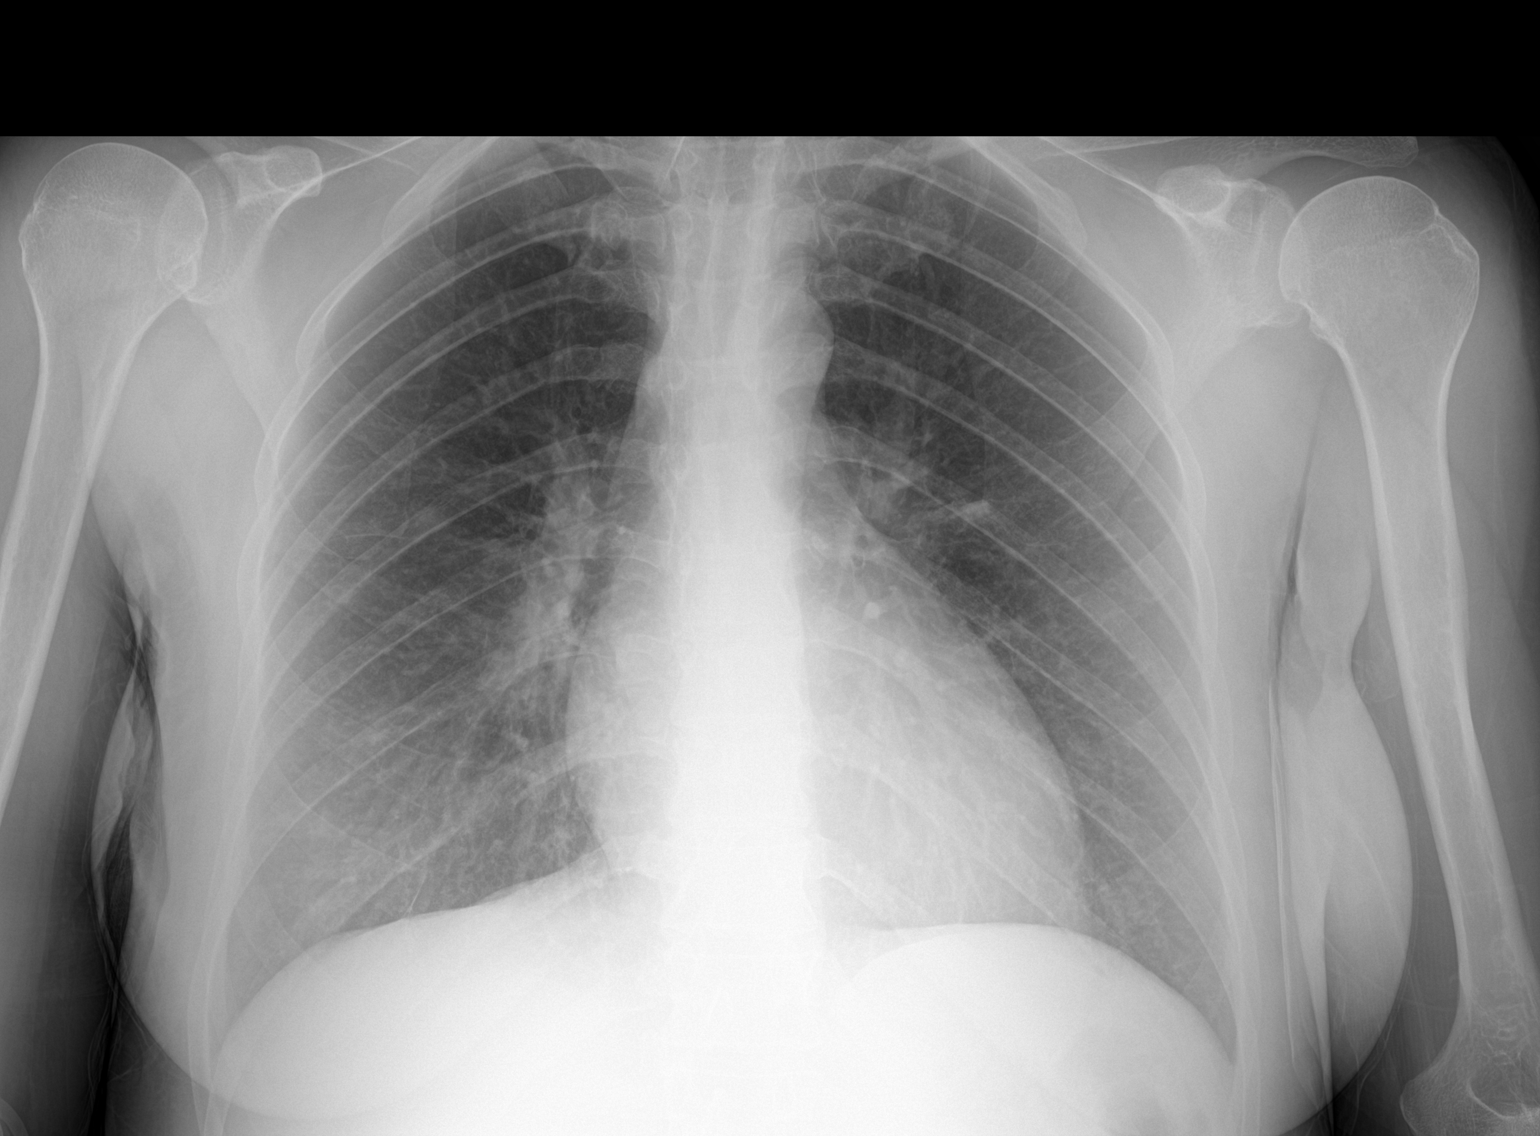

[1 of 1 positions shown; findings below may reference images not displayed]

FINDINGS: The patchy ground-glass opacities in the lower lobes and [place] CT
are not well seen radiographically. Normal heart size and
mediastinal contours. No pleural effusion or pneumothorax. No
pulmonary edema. Unchanged osseous structures.
IMPRESSION: Lung base opacities on abdominal CT are not well seen
radiographically.

## 2021-10-28 DIAGNOSIS — I10 Essential (primary) hypertension: Secondary | ICD-10-CM | POA: Diagnosis not present

## 2021-10-28 DIAGNOSIS — F32A Depression, unspecified: Secondary | ICD-10-CM | POA: Diagnosis not present

## 2021-10-28 DIAGNOSIS — F411 Generalized anxiety disorder: Secondary | ICD-10-CM | POA: Diagnosis not present

## 2021-10-28 DIAGNOSIS — D649 Anemia, unspecified: Secondary | ICD-10-CM | POA: Diagnosis not present

## 2021-10-28 DIAGNOSIS — E7800 Pure hypercholesterolemia, unspecified: Secondary | ICD-10-CM | POA: Diagnosis not present

## 2021-10-28 DIAGNOSIS — L4050 Arthropathic psoriasis, unspecified: Secondary | ICD-10-CM | POA: Diagnosis not present

## 2021-10-28 DIAGNOSIS — E109 Type 1 diabetes mellitus without complications: Secondary | ICD-10-CM | POA: Diagnosis not present

## 2021-10-28 DIAGNOSIS — R5383 Other fatigue: Secondary | ICD-10-CM | POA: Diagnosis not present

## 2021-11-21 DIAGNOSIS — E1065 Type 1 diabetes mellitus with hyperglycemia: Secondary | ICD-10-CM | POA: Diagnosis not present

## 2021-11-28 DIAGNOSIS — E1140 Type 2 diabetes mellitus with diabetic neuropathy, unspecified: Secondary | ICD-10-CM | POA: Diagnosis not present

## 2021-11-28 DIAGNOSIS — F32A Depression, unspecified: Secondary | ICD-10-CM | POA: Diagnosis not present

## 2021-11-28 DIAGNOSIS — L4050 Arthropathic psoriasis, unspecified: Secondary | ICD-10-CM | POA: Diagnosis not present

## 2021-11-28 DIAGNOSIS — E109 Type 1 diabetes mellitus without complications: Secondary | ICD-10-CM | POA: Diagnosis not present

## 2022-01-02 DIAGNOSIS — E7800 Pure hypercholesterolemia, unspecified: Secondary | ICD-10-CM | POA: Diagnosis not present

## 2022-01-02 DIAGNOSIS — I10 Essential (primary) hypertension: Secondary | ICD-10-CM | POA: Diagnosis not present

## 2022-01-02 DIAGNOSIS — E1065 Type 1 diabetes mellitus with hyperglycemia: Secondary | ICD-10-CM | POA: Diagnosis not present

## 2022-02-06 DIAGNOSIS — M0579 Rheumatoid arthritis with rheumatoid factor of multiple sites without organ or systems involvement: Secondary | ICD-10-CM | POA: Diagnosis not present

## 2022-03-02 DIAGNOSIS — E7800 Pure hypercholesterolemia, unspecified: Secondary | ICD-10-CM | POA: Diagnosis not present

## 2022-03-02 DIAGNOSIS — I10 Essential (primary) hypertension: Secondary | ICD-10-CM | POA: Diagnosis not present

## 2022-03-07 DIAGNOSIS — R5383 Other fatigue: Secondary | ICD-10-CM | POA: Diagnosis not present

## 2022-03-07 DIAGNOSIS — Z79899 Other long term (current) drug therapy: Secondary | ICD-10-CM | POA: Diagnosis not present

## 2022-03-07 DIAGNOSIS — M328 Other forms of systemic lupus erythematosus: Secondary | ICD-10-CM | POA: Diagnosis not present

## 2022-03-07 DIAGNOSIS — E785 Hyperlipidemia, unspecified: Secondary | ICD-10-CM | POA: Diagnosis not present

## 2022-03-07 DIAGNOSIS — E7800 Pure hypercholesterolemia, unspecified: Secondary | ICD-10-CM | POA: Diagnosis not present

## 2022-03-16 DIAGNOSIS — E1065 Type 1 diabetes mellitus with hyperglycemia: Secondary | ICD-10-CM | POA: Diagnosis not present

## 2022-04-11 DIAGNOSIS — M328 Other forms of systemic lupus erythematosus: Secondary | ICD-10-CM | POA: Diagnosis not present

## 2022-04-11 DIAGNOSIS — Z791 Long term (current) use of non-steroidal anti-inflammatories (NSAID): Secondary | ICD-10-CM | POA: Diagnosis not present

## 2022-05-23 DIAGNOSIS — M328 Other forms of systemic lupus erythematosus: Secondary | ICD-10-CM | POA: Diagnosis not present

## 2022-05-23 DIAGNOSIS — Z791 Long term (current) use of non-steroidal anti-inflammatories (NSAID): Secondary | ICD-10-CM | POA: Diagnosis not present

## 2022-05-23 DIAGNOSIS — M150 Primary generalized (osteo)arthritis: Secondary | ICD-10-CM | POA: Diagnosis not present

## 2022-05-23 DIAGNOSIS — Z79899 Other long term (current) drug therapy: Secondary | ICD-10-CM | POA: Diagnosis not present

## 2022-06-22 DIAGNOSIS — Z791 Long term (current) use of non-steroidal anti-inflammatories (NSAID): Secondary | ICD-10-CM | POA: Diagnosis not present

## 2022-06-22 DIAGNOSIS — Z79899 Other long term (current) drug therapy: Secondary | ICD-10-CM | POA: Diagnosis not present

## 2022-06-22 DIAGNOSIS — M328 Other forms of systemic lupus erythematosus: Secondary | ICD-10-CM | POA: Diagnosis not present

## 2022-06-22 DIAGNOSIS — M150 Primary generalized (osteo)arthritis: Secondary | ICD-10-CM | POA: Diagnosis not present

## 2022-07-06 DIAGNOSIS — E049 Nontoxic goiter, unspecified: Secondary | ICD-10-CM | POA: Diagnosis not present

## 2022-07-06 DIAGNOSIS — E7800 Pure hypercholesterolemia, unspecified: Secondary | ICD-10-CM | POA: Diagnosis not present

## 2022-07-06 DIAGNOSIS — Z9641 Presence of insulin pump (external) (internal): Secondary | ICD-10-CM | POA: Diagnosis not present

## 2022-07-06 DIAGNOSIS — E1065 Type 1 diabetes mellitus with hyperglycemia: Secondary | ICD-10-CM | POA: Diagnosis not present

## 2022-07-10 DIAGNOSIS — E1065 Type 1 diabetes mellitus with hyperglycemia: Secondary | ICD-10-CM | POA: Diagnosis not present

## 2022-07-20 DIAGNOSIS — M150 Primary generalized (osteo)arthritis: Secondary | ICD-10-CM | POA: Diagnosis not present

## 2022-07-20 DIAGNOSIS — M328 Other forms of systemic lupus erythematosus: Secondary | ICD-10-CM | POA: Diagnosis not present

## 2022-07-20 DIAGNOSIS — Z791 Long term (current) use of non-steroidal anti-inflammatories (NSAID): Secondary | ICD-10-CM | POA: Diagnosis not present

## 2022-08-01 DIAGNOSIS — E871 Hypo-osmolality and hyponatremia: Secondary | ICD-10-CM | POA: Diagnosis not present

## 2022-08-21 DIAGNOSIS — M150 Primary generalized (osteo)arthritis: Secondary | ICD-10-CM | POA: Diagnosis not present

## 2022-08-21 DIAGNOSIS — Z791 Long term (current) use of non-steroidal anti-inflammatories (NSAID): Secondary | ICD-10-CM | POA: Diagnosis not present

## 2022-08-21 DIAGNOSIS — Z79899 Other long term (current) drug therapy: Secondary | ICD-10-CM | POA: Diagnosis not present

## 2022-08-21 DIAGNOSIS — M328 Other forms of systemic lupus erythematosus: Secondary | ICD-10-CM | POA: Diagnosis not present

## 2022-09-05 DIAGNOSIS — F32A Depression, unspecified: Secondary | ICD-10-CM | POA: Diagnosis not present

## 2022-09-05 DIAGNOSIS — E109 Type 1 diabetes mellitus without complications: Secondary | ICD-10-CM | POA: Diagnosis not present

## 2022-09-05 DIAGNOSIS — M329 Systemic lupus erythematosus, unspecified: Secondary | ICD-10-CM | POA: Diagnosis not present

## 2022-09-05 DIAGNOSIS — E1140 Type 2 diabetes mellitus with diabetic neuropathy, unspecified: Secondary | ICD-10-CM | POA: Diagnosis not present

## 2022-09-14 DIAGNOSIS — M150 Primary generalized (osteo)arthritis: Secondary | ICD-10-CM | POA: Diagnosis not present

## 2022-09-14 DIAGNOSIS — Z79899 Other long term (current) drug therapy: Secondary | ICD-10-CM | POA: Diagnosis not present

## 2022-09-14 DIAGNOSIS — M328 Other forms of systemic lupus erythematosus: Secondary | ICD-10-CM | POA: Diagnosis not present

## 2022-10-25 DIAGNOSIS — E1065 Type 1 diabetes mellitus with hyperglycemia: Secondary | ICD-10-CM | POA: Diagnosis not present

## 2022-11-06 DIAGNOSIS — E049 Nontoxic goiter, unspecified: Secondary | ICD-10-CM | POA: Diagnosis not present

## 2022-11-06 DIAGNOSIS — E1065 Type 1 diabetes mellitus with hyperglycemia: Secondary | ICD-10-CM | POA: Diagnosis not present

## 2022-11-14 DIAGNOSIS — E871 Hypo-osmolality and hyponatremia: Secondary | ICD-10-CM | POA: Diagnosis not present

## 2022-11-15 ENCOUNTER — Emergency Department (HOSPITAL_BASED_OUTPATIENT_CLINIC_OR_DEPARTMENT_OTHER)
Admission: EM | Admit: 2022-11-15 | Discharge: 2022-11-15 | Disposition: A | Discharge status: Home / Self Care | Payer: BC Managed Care – PPO | Attending: Emergency Medicine | Admitting: Emergency Medicine

## 2022-11-15 ENCOUNTER — Other Ambulatory Visit: Payer: Self-pay

## 2022-11-15 ENCOUNTER — Encounter (HOSPITAL_BASED_OUTPATIENT_CLINIC_OR_DEPARTMENT_OTHER): Payer: Self-pay | Admitting: Emergency Medicine

## 2022-11-15 DIAGNOSIS — R7989 Other specified abnormal findings of blood chemistry: Secondary | ICD-10-CM | POA: Diagnosis not present

## 2022-11-15 DIAGNOSIS — E1065 Type 1 diabetes mellitus with hyperglycemia: Secondary | ICD-10-CM | POA: Diagnosis not present

## 2022-11-15 DIAGNOSIS — Z0000 Encounter for general adult medical examination without abnormal findings: Secondary | ICD-10-CM | POA: Diagnosis not present

## 2022-11-15 DIAGNOSIS — E871 Hypo-osmolality and hyponatremia: Secondary | ICD-10-CM | POA: Diagnosis not present

## 2022-11-15 DIAGNOSIS — Z0189 Encounter for other specified special examinations: Secondary | ICD-10-CM

## 2022-11-15 DIAGNOSIS — Z794 Long term (current) use of insulin: Secondary | ICD-10-CM | POA: Diagnosis not present

## 2022-11-15 DIAGNOSIS — E875 Hyperkalemia: Secondary | ICD-10-CM | POA: Diagnosis not present

## 2022-11-15 HISTORY — DX: Reserved for concepts with insufficient information to code with codable children: IMO0002

## 2022-11-15 LAB — BASIC METABOLIC PANEL
Anion gap: 8 (ref 5–15)
BUN: 27 mg/dL — ABNORMAL HIGH (ref 6–20)
CO2: 24 mmol/L (ref 22–32)
Calcium: 9.5 mg/dL (ref 8.9–10.3)
Chloride: 100 mmol/L (ref 98–111)
Creatinine, Ser: 1.13 mg/dL — ABNORMAL HIGH (ref 0.44–1.00)
GFR, Estimated: >60 mL/min (ref >60)
Glucose, Bld: 207 mg/dL — ABNORMAL HIGH (ref 70–99)
Potassium: 5 mmol/L (ref 3.5–5.1)
Sodium: 132 mmol/L — ABNORMAL LOW (ref 135–145)

## 2022-11-15 LAB — CBC
HCT: 39.1 % (ref 36.0–46.0)
Hemoglobin: 13 g/dL (ref 12.0–15.0)
MCH: 27.3 pg (ref 26.0–34.0)
MCHC: 33.2 g/dL (ref 30.0–36.0)
MCV: 82.1 fL (ref 80.0–100.0)
Platelets: 361 10*3/uL (ref 150–400)
RBC: 4.76 MIL/uL (ref 3.87–5.11)
RDW: 14.5 % (ref 11.5–15.5)
WBC: 8.2 10*3/uL (ref 4.0–10.5)
nRBC: 0 % (ref 0.0–0.2)

## 2022-11-15 NOTE — ED Provider Notes (Signed)
[place] EMERGENCY DEPARTMENT AT [facility] Provider Note   CSN: [id number] Arrival date & time: 11/15/22  1042     History  Chief Complaint  Patient presents with   Abnormal Lab    [name] is a 42 y.o. female sent in for her hyperkalemia.  She has a past medical history of type 1 diabetes.  She had routine lab work done and noted to have a potassium of 6.6.  Baseline creatinine shows some mild renal insufficiency.  She denies chest pain or shortness of breath.   Abnormal Lab      Home Medications Prior to Admission medications   Medication Sig Start Date End Date Taking? Authorizing Provider  insulin lispro (HUMALOG) 100 UNIT/ML injection Inject 7-10 Units into the skin continuous. Uses daytronic pump    [provider]  Lancets (ACCU-CHEK MULTICLIX) lancets Use as instructed 08/01/14   Dhungel, Nishant, MD  lisinopril (PRINIVIL,ZESTRIL) 2.5 MG tablet Take 2.5 mg by mouth daily.    [provider]  ondansetron (ZOFRAN) 4 MG tablet Take 1 tablet (4 mg total) by mouth every 6 (six) hours as needed for nausea. Patient not taking: Reported on 04/23/2018 05/14/17   Glade Lloyd, MD  pantoprazole (PROTONIX) 40 MG tablet Take 1 tablet (40 mg total) by mouth daily. Patient not taking: Reported on 04/23/2018 05/14/17   Glade Lloyd, MD  pregabalin (LYRICA) 150 MG capsule Take 150 mg by mouth 3 (three) times daily.    [provider]  promethazine (PHENERGAN) 25 MG tablet Take 1 tablet (25 mg total) by mouth every 6 (six) hours as needed for nausea or vomiting. 04/24/18   Liberty Handy, PA-C  sertraline (ZOLOFT) 100 MG tablet Take 100 mg by mouth daily.    [provider]      Allergies    Patient has no known allergies.    Review of Systems   Review of Systems  Physical Exam Updated Vital Signs BP (!) 152/78 (BP Location: Right Arm)   Pulse 75   Temp 98.3 F (36.8 C)   Resp 17   Ht 5\' 4"  (1.626 m)   Wt 72.6 kg   LMP  10/26/2022   SpO2 100%   BMI 27.46 kg/m  Physical Exam Physical Exam  Nursing note and vitals reviewed. Constitutional: She is oriented to person, place, and time. She appears well-developed and well-nourished. No distress.  HENT:  Head: Normocephalic and atraumatic.  Eyes: Conjunctivae normal and EOM are normal. Pupils are equal, round, and reactive to light. No scleral icterus.  Neck: Normal range of motion.  Cardiovascular: Normal rate, regular rhythm and normal heart sounds.  Exam reveals no gallop and no friction rub.   No murmur heard. Pulmonary/Chest: Effort normal and breath sounds normal. No respiratory distress.  Abdominal: Soft. Bowel sounds are normal. She exhibits no distension and no mass. There is no tenderness. There is no guarding.  Neurological: She is alert and oriented to person, place, and time.  Skin: Skin is warm and dry. She is not diaphoretic.   ED Results / Procedures / Treatments   Labs (all labs ordered are listed, but only abnormal results are displayed) Labs Reviewed  BASIC METABOLIC PANEL - Abnormal; Notable for the following components:      Result Value   Sodium 132 (*)    Glucose, Bld 207 (*)    BUN 27 (*)    Creatinine, Ser 1.13 (*)    All other components within normal limits  CBC    EKG None  Radiology No results found.  Procedures Procedures    Medications Ordered in ED Medications - No data to display  ED Course/ Medical Decision Making/ A&P                             Medical Decision Making Amount and/or Complexity of Data Reviewed Labs: ordered.   Patient's labs ordered and reviewed today show glucose of 207, Sudini hyponatremia in the setting of glucose of 207.  Patient's creatinine and BUN at baseline.  Potassium 5.0 within normal limits.   Suspect lab error from hemolysis.  Patient appears otherwise appropriate for discharge at this time        Final Clinical Impression(s) / ED Diagnoses Final diagnoses:   None    Rx / DC Orders ED Discharge Orders     None         Arthor Captain, PA-C 11/15/22 1323    Ernie Avena, MD 11/15/22 1330

## 2022-11-15 NOTE — Discharge Instructions (Signed)
Your lab work was normal today.  Please follow-up with your primary care physician for any new or worsening symptoms.

## 2022-11-15 NOTE — ED Triage Notes (Signed)
Patient arrives ambulatory by POV states her doctor called her today stating her potassium was 6.6. denies any complaints at this time.

## 2022-11-23 DIAGNOSIS — M79671 Pain in right foot: Secondary | ICD-10-CM | POA: Diagnosis not present

## 2022-11-23 DIAGNOSIS — M2550 Pain in unspecified joint: Secondary | ICD-10-CM | POA: Diagnosis not present

## 2022-11-23 DIAGNOSIS — M25562 Pain in left knee: Secondary | ICD-10-CM | POA: Diagnosis not present

## 2022-11-23 DIAGNOSIS — M25561 Pain in right knee: Secondary | ICD-10-CM | POA: Diagnosis not present

## 2022-11-23 DIAGNOSIS — M79642 Pain in left hand: Secondary | ICD-10-CM | POA: Diagnosis not present

## 2022-11-23 DIAGNOSIS — M79641 Pain in right hand: Secondary | ICD-10-CM | POA: Diagnosis not present

## 2022-11-23 DIAGNOSIS — R229 Localized swelling, mass and lump, unspecified: Secondary | ICD-10-CM | POA: Diagnosis not present

## 2022-11-23 DIAGNOSIS — M1990 Unspecified osteoarthritis, unspecified site: Secondary | ICD-10-CM | POA: Diagnosis not present

## 2022-11-23 DIAGNOSIS — M549 Dorsalgia, unspecified: Secondary | ICD-10-CM | POA: Diagnosis not present

## 2022-11-23 DIAGNOSIS — R21 Rash and other nonspecific skin eruption: Secondary | ICD-10-CM | POA: Diagnosis not present

## 2022-11-23 DIAGNOSIS — M79672 Pain in left foot: Secondary | ICD-10-CM | POA: Diagnosis not present

## 2022-11-23 DIAGNOSIS — M329 Systemic lupus erythematosus, unspecified: Secondary | ICD-10-CM | POA: Diagnosis not present

## 2022-11-28 DIAGNOSIS — E7800 Pure hypercholesterolemia, unspecified: Secondary | ICD-10-CM | POA: Diagnosis not present

## 2022-11-28 DIAGNOSIS — E109 Type 1 diabetes mellitus without complications: Secondary | ICD-10-CM | POA: Diagnosis not present

## 2022-11-28 DIAGNOSIS — M329 Systemic lupus erythematosus, unspecified: Secondary | ICD-10-CM | POA: Diagnosis not present

## 2022-11-28 DIAGNOSIS — I10 Essential (primary) hypertension: Secondary | ICD-10-CM | POA: Diagnosis not present

## 2022-11-30 ENCOUNTER — Other Ambulatory Visit: Payer: Self-pay | Admitting: Rheumatology

## 2022-11-30 DIAGNOSIS — J849 Interstitial pulmonary disease, unspecified: Secondary | ICD-10-CM

## 2022-12-07 DIAGNOSIS — M1990 Unspecified osteoarthritis, unspecified site: Secondary | ICD-10-CM | POA: Diagnosis not present

## 2022-12-07 DIAGNOSIS — R229 Localized swelling, mass and lump, unspecified: Secondary | ICD-10-CM | POA: Diagnosis not present

## 2022-12-07 DIAGNOSIS — M329 Systemic lupus erythematosus, unspecified: Secondary | ICD-10-CM | POA: Diagnosis not present

## 2022-12-07 DIAGNOSIS — R21 Rash and other nonspecific skin eruption: Secondary | ICD-10-CM | POA: Diagnosis not present

## 2022-12-12 DIAGNOSIS — Z0000 Encounter for general adult medical examination without abnormal findings: Secondary | ICD-10-CM | POA: Diagnosis not present

## 2022-12-12 DIAGNOSIS — E109 Type 1 diabetes mellitus without complications: Secondary | ICD-10-CM | POA: Diagnosis not present

## 2022-12-12 DIAGNOSIS — F32A Depression, unspecified: Secondary | ICD-10-CM | POA: Diagnosis not present

## 2022-12-12 DIAGNOSIS — M329 Systemic lupus erythematosus, unspecified: Secondary | ICD-10-CM | POA: Diagnosis not present

## 2022-12-18 DIAGNOSIS — M329 Systemic lupus erythematosus, unspecified: Secondary | ICD-10-CM | POA: Diagnosis not present

## 2023-01-01 DIAGNOSIS — M329 Systemic lupus erythematosus, unspecified: Secondary | ICD-10-CM | POA: Diagnosis not present

## 2023-01-15 DIAGNOSIS — M329 Systemic lupus erythematosus, unspecified: Secondary | ICD-10-CM | POA: Diagnosis not present

## 2023-01-18 DIAGNOSIS — M1990 Unspecified osteoarthritis, unspecified site: Secondary | ICD-10-CM | POA: Diagnosis not present

## 2023-01-18 DIAGNOSIS — M79643 Pain in unspecified hand: Secondary | ICD-10-CM | POA: Diagnosis not present

## 2023-01-18 DIAGNOSIS — R21 Rash and other nonspecific skin eruption: Secondary | ICD-10-CM | POA: Diagnosis not present

## 2023-01-18 DIAGNOSIS — M329 Systemic lupus erythematosus, unspecified: Secondary | ICD-10-CM | POA: Diagnosis not present

## 2023-02-12 DIAGNOSIS — M329 Systemic lupus erythematosus, unspecified: Secondary | ICD-10-CM | POA: Diagnosis not present

## 2023-02-28 DIAGNOSIS — E1065 Type 1 diabetes mellitus with hyperglycemia: Secondary | ICD-10-CM | POA: Diagnosis not present

## 2023-03-12 DIAGNOSIS — R229 Localized swelling, mass and lump, unspecified: Secondary | ICD-10-CM | POA: Diagnosis not present

## 2023-03-12 DIAGNOSIS — M329 Systemic lupus erythematosus, unspecified: Secondary | ICD-10-CM | POA: Diagnosis not present

## 2023-03-12 DIAGNOSIS — M1990 Unspecified osteoarthritis, unspecified site: Secondary | ICD-10-CM | POA: Diagnosis not present

## 2023-03-12 DIAGNOSIS — R21 Rash and other nonspecific skin eruption: Secondary | ICD-10-CM | POA: Diagnosis not present

## 2023-04-16 ENCOUNTER — Ambulatory Visit (HOSPITAL_BASED_OUTPATIENT_CLINIC_OR_DEPARTMENT_OTHER): Payer: BC Managed Care – PPO | Admitting: Family Medicine

## 2023-05-07 DIAGNOSIS — M329 Systemic lupus erythematosus, unspecified: Secondary | ICD-10-CM | POA: Diagnosis not present

## 2023-05-25 ENCOUNTER — Encounter: Payer: Self-pay | Admitting: Rheumatology

## 2023-05-31 DIAGNOSIS — E7800 Pure hypercholesterolemia, unspecified: Secondary | ICD-10-CM | POA: Diagnosis not present

## 2023-05-31 DIAGNOSIS — E049 Nontoxic goiter, unspecified: Secondary | ICD-10-CM | POA: Diagnosis not present

## 2023-05-31 DIAGNOSIS — E1065 Type 1 diabetes mellitus with hyperglycemia: Secondary | ICD-10-CM | POA: Diagnosis not present

## 2023-05-31 DIAGNOSIS — Z9641 Presence of insulin pump (external) (internal): Secondary | ICD-10-CM | POA: Diagnosis not present

## 2023-06-01 ENCOUNTER — Other Ambulatory Visit: Payer: BC Managed Care – PPO

## 2023-06-04 DIAGNOSIS — M329 Systemic lupus erythematosus, unspecified: Secondary | ICD-10-CM | POA: Diagnosis not present

## 2023-06-07 ENCOUNTER — Ambulatory Visit
Admission: RE | Admit: 2023-06-07 | Discharge: 2023-06-07 | Disposition: A | Discharge status: Home / Self Care | Payer: BC Managed Care – PPO | Source: Ambulatory Visit | Attending: Rheumatology | Admitting: Rheumatology

## 2023-06-07 DIAGNOSIS — J849 Interstitial pulmonary disease, unspecified: Secondary | ICD-10-CM

## 2023-06-18 DIAGNOSIS — R229 Localized swelling, mass and lump, unspecified: Secondary | ICD-10-CM | POA: Diagnosis not present

## 2023-06-18 DIAGNOSIS — R21 Rash and other nonspecific skin eruption: Secondary | ICD-10-CM | POA: Diagnosis not present

## 2023-06-18 DIAGNOSIS — M329 Systemic lupus erythematosus, unspecified: Secondary | ICD-10-CM | POA: Diagnosis not present

## 2023-06-18 DIAGNOSIS — M1990 Unspecified osteoarthritis, unspecified site: Secondary | ICD-10-CM | POA: Diagnosis not present

## 2023-06-21 ENCOUNTER — Other Ambulatory Visit (HOSPITAL_COMMUNITY): Payer: Self-pay | Admitting: Internal Medicine

## 2023-06-21 DIAGNOSIS — Z1231 Encounter for screening mammogram for malignant neoplasm of breast: Secondary | ICD-10-CM

## 2023-06-21 DIAGNOSIS — Z6828 Body mass index (BMI) 28.0-28.9, adult: Secondary | ICD-10-CM | POA: Diagnosis not present

## 2023-06-21 DIAGNOSIS — E663 Overweight: Secondary | ICD-10-CM | POA: Diagnosis not present

## 2023-06-21 DIAGNOSIS — Z0001 Encounter for general adult medical examination with abnormal findings: Secondary | ICD-10-CM | POA: Diagnosis not present

## 2023-06-21 DIAGNOSIS — M3210 Systemic lupus erythematosus, organ or system involvement unspecified: Secondary | ICD-10-CM | POA: Diagnosis not present

## 2023-06-21 DIAGNOSIS — Z1331 Encounter for screening for depression: Secondary | ICD-10-CM | POA: Diagnosis not present

## 2023-06-21 DIAGNOSIS — E1165 Type 2 diabetes mellitus with hyperglycemia: Secondary | ICD-10-CM | POA: Diagnosis not present

## 2023-06-27 DIAGNOSIS — E1065 Type 1 diabetes mellitus with hyperglycemia: Secondary | ICD-10-CM | POA: Diagnosis not present

## 2023-07-05 DIAGNOSIS — M329 Systemic lupus erythematosus, unspecified: Secondary | ICD-10-CM | POA: Diagnosis not present

## 2023-07-06 ENCOUNTER — Encounter (HOSPITAL_COMMUNITY): Payer: Self-pay | Admitting: Radiology

## 2023-07-06 ENCOUNTER — Ambulatory Visit (HOSPITAL_COMMUNITY)
Admission: RE | Admit: 2023-07-06 | Discharge: 2023-07-06 | Disposition: A | Discharge status: Home / Self Care | Payer: BC Managed Care – PPO | Source: Ambulatory Visit | Attending: Internal Medicine | Admitting: Internal Medicine

## 2023-07-06 DIAGNOSIS — Z1231 Encounter for screening mammogram for malignant neoplasm of breast: Secondary | ICD-10-CM | POA: Insufficient documentation

## 2023-07-12 ENCOUNTER — Other Ambulatory Visit (HOSPITAL_COMMUNITY): Payer: Self-pay | Admitting: Internal Medicine

## 2023-07-12 DIAGNOSIS — R928 Other abnormal and inconclusive findings on diagnostic imaging of breast: Secondary | ICD-10-CM

## 2023-07-16 ENCOUNTER — Encounter: Payer: Self-pay | Admitting: Women's Health

## 2023-07-16 ENCOUNTER — Other Ambulatory Visit (HOSPITAL_COMMUNITY)
Admission: RE | Admit: 2023-07-16 | Discharge: 2023-07-16 | Disposition: A | Discharge status: Home / Self Care | Source: Ambulatory Visit | Attending: Women's Health | Admitting: Women's Health

## 2023-07-16 ENCOUNTER — Ambulatory Visit: Payer: BC Managed Care – PPO | Admitting: Women's Health

## 2023-07-16 VITALS — BP 167/79 | HR 84 | Ht 64.0 in | Wt 171.0 lb

## 2023-07-16 DIAGNOSIS — N912 Amenorrhea, unspecified: Secondary | ICD-10-CM | POA: Diagnosis not present

## 2023-07-16 DIAGNOSIS — Z1211 Encounter for screening for malignant neoplasm of colon: Secondary | ICD-10-CM

## 2023-07-16 DIAGNOSIS — Z01419 Encounter for gynecological examination (general) (routine) without abnormal findings: Secondary | ICD-10-CM | POA: Insufficient documentation

## 2023-07-16 DIAGNOSIS — I10 Essential (primary) hypertension: Secondary | ICD-10-CM | POA: Diagnosis not present

## 2023-07-16 DIAGNOSIS — Z1212 Encounter for screening for malignant neoplasm of rectum: Secondary | ICD-10-CM

## 2023-07-16 DIAGNOSIS — M329 Systemic lupus erythematosus, unspecified: Secondary | ICD-10-CM | POA: Insufficient documentation

## 2023-07-16 NOTE — Progress Notes (Signed)
 WELL-WOMAN EXAMINATION Patient name: [name] MRN [id number]  Date of birth: Apr 11, 1981 Chief Complaint:   Gynecologic Exam  History of Present Illness:   [name] is a 43 y.o. G4P0110 Caucasian female being seen today for a routine well-woman exam.  Current complaints: no period in >22yr, prior to that periods were monthly and heavy, would occ skip 1 month. Night sweats, no hot flashes, mood swings or vaginal dryness. Uncertain of how old mom was when she went through menopause.  Had uterine surgery to remove fibroids, fallopian tubes 'collapsed', had surgery to try to correct, but doesn't think it worked- has had unprotected sex for past 10 years. Potentially interested in having a baby, discussed uncertainty if this is possible, and possible adoption, etc.  2005 pregnancy- IUFD at 2007 pregnancy- SAB @ [facility], DM, CHTN (took meds this am)-always elevated at doctors 'especially OB'  PCP: Robbie Lis      Patient's last menstrual period was 10/26/2022. The current method of family planning is none.  Last pap 'all paps abnormal', last >87yrs ago w/ Dr. Juluis Rainier.  Last mammogram: 07/06/23. Results were: abnormal possible masses Lt breast, scheduled for diagnostic and u/s on 4/1 . Family h/o breast cancer: no Last colonoscopy: never. Results were: N/A. Family h/o colorectal cancer: no     07/16/2023    3:23 PM 03/03/2015    3:48 PM  Depression screen PHQ 2/9  Decreased Interest 1 0  Down, Depressed, Hopeless 1 0  PHQ - 2 Score 2 0  Altered sleeping 1   Tired, decreased energy 1   Change in appetite 0   Feeling bad or failure about yourself  0   Trouble concentrating 0   Moving slowly or fidgety/restless 0   Suicidal thoughts 0   PHQ-9 Score 4         07/16/2023    3:24 PM  GAD 7 : Generalized Anxiety Score  Nervous, Anxious, on Edge 0  Control/stop worrying 0  Worry too much - different things 1  Trouble relaxing 0  Restless 1  Easily annoyed or  irritable 0  Afraid - awful might happen 0  Total GAD 7 Score 2     Review of Systems:   Pertinent items are noted in HPI Denies any headaches, blurred vision, fatigue, shortness of breath, chest pain, abdominal pain, abnormal vaginal discharge/itching/odor/irritation, problems with periods, bowel movements, urination, or intercourse unless otherwise stated above. Pertinent History Reviewed:  Reviewed past medical,surgical, social and family history.  Reviewed problem list, medications and allergies. Physical Assessment:   Vitals:   07/16/23 1528 07/16/23 1546  BP: (!) 161/79 (!) 167/79  Pulse: 84 84  Weight: 171 lb (77.6 kg)   Height: 5\' 4"  (1.626 m)   Body mass index is 29.35 kg/m.        Physical Examination:   General appearance - well appearing, and in no distress  Mental status - alert, oriented to person, place, and time  Psych:  She has a normal mood and affect  Skin - warm and dry, normal color, no suspicious lesions noted  Chest - effort normal, all lung fields clear to auscultation bilaterally  Heart - normal rate and regular rhythm  Neck:  midline trachea, no thyromegaly or nodules  Breasts - breasts appear normal, no suspicious masses, no skin or nipple changes or  axillary nodes  Abdomen - soft, nontender, nondistended, no masses or organomegaly  Pelvic - VULVA: normal appearing vulva with no masses,  tenderness or lesions  VAGINA: normal appearing vagina with normal color and discharge, no lesions  CERVIX: normal appearing cervix without discharge or lesions, no CMT  Thin prep pap is done w/ HR HPV cotesting  UTERUS: uterus is felt to be normal size, shape, consistency and nontender   ADNEXA: No adnexal masses or tenderness noted.  Extremities:  No swelling or varicosities noted  Chaperone: Peggy Dones  No results found for this or any previous visit (from the past 24 hours).  Assessment & Plan:  1) Well-Woman Exam  2) CHTN> on meds, managed by PCP, bp was  good there recently, states high when comes to OB  3) Amenorrhea x 1yr> occ night sweats, no other menopausal sx, ?med related, will check FSH  4) DM & Lupus> managed by PCP  Labs/procedures today: pap, FSH  Mammogram:  as scheduled Colonoscopy:  turns 43yo on 4/17- GI referral entered  Orders Placed This Encounter  Procedures   [facility]   Ambulatory referral to Gastroenterology    Meds: No orders of the defined types were placed in this encounter.   Follow-up: Return for get last pap records from Dr. Estanislado Pandy; will discuss results via mychart.  Cheral Marker CNM, [facility] 07/16/2023 4:24 PM

## 2023-07-16 NOTE — Patient Instructions (Signed)
Fertility Specialists  ?[facility], [address], [address], [phone], [email or web address] ?[facility], [address] [place], [place] [address], [phone]  ?

## 2023-07-16 NOTE — Addendum Note (Signed)
 Addended by: Federico Flake A on: 07/16/2023 04:45 PM   Modules accepted: Orders

## 2023-07-17 ENCOUNTER — Encounter: Payer: Self-pay | Admitting: Women's Health

## 2023-07-17 LAB — FOLLICLE STIMULATING HORMONE: FSH: 98.8 m[IU]/mL

## 2023-07-18 ENCOUNTER — Encounter: Payer: Self-pay | Admitting: *Deleted

## 2023-07-20 LAB — CYTOLOGY - PAP
Comment: NEGATIVE
Comment: NEGATIVE
Comment: NEGATIVE
Diagnosis: HIGH — AB
HPV 16: NEGATIVE
HPV 18 / 45: NEGATIVE
High risk HPV: POSITIVE — AB

## 2023-07-23 ENCOUNTER — Encounter: Payer: Self-pay | Admitting: Adult Health

## 2023-07-23 DIAGNOSIS — R87613 High grade squamous intraepithelial lesion on cytologic smear of cervix (HGSIL): Secondary | ICD-10-CM | POA: Insufficient documentation

## 2023-07-25 DIAGNOSIS — Z9641 Presence of insulin pump (external) (internal): Secondary | ICD-10-CM | POA: Diagnosis not present

## 2023-07-25 DIAGNOSIS — E1065 Type 1 diabetes mellitus with hyperglycemia: Secondary | ICD-10-CM | POA: Diagnosis not present

## 2023-08-02 DIAGNOSIS — M329 Systemic lupus erythematosus, unspecified: Secondary | ICD-10-CM | POA: Diagnosis not present

## 2023-08-03 ENCOUNTER — Encounter: Payer: Self-pay | Admitting: Obstetrics & Gynecology

## 2023-08-03 ENCOUNTER — Other Ambulatory Visit (HOSPITAL_COMMUNITY)
Admission: RE | Admit: 2023-08-03 | Discharge: 2023-08-03 | Disposition: A | Discharge status: Home / Self Care | Source: Ambulatory Visit | Attending: Obstetrics & Gynecology | Admitting: Obstetrics & Gynecology

## 2023-08-03 ENCOUNTER — Ambulatory Visit: Admitting: Obstetrics & Gynecology

## 2023-08-03 VITALS — BP 127/74 | HR 80

## 2023-08-03 DIAGNOSIS — R87613 High grade squamous intraepithelial lesion on cytologic smear of cervix (HGSIL): Secondary | ICD-10-CM

## 2023-08-03 DIAGNOSIS — N871 Moderate cervical dysplasia: Secondary | ICD-10-CM | POA: Diagnosis not present

## 2023-08-03 DIAGNOSIS — N72 Inflammatory disease of cervix uteri: Secondary | ICD-10-CM | POA: Diagnosis not present

## 2023-08-03 MED ORDER — MEDROXYPROGESTERONE ACETATE 10 MG PO TABS: 10.0000 mg | ORAL_TABLET | Freq: Every day | ORAL | 0 refills | Status: DC

## 2023-08-03 NOTE — Addendum Note (Signed)
 Addended by: Annamarie Dawley on: 08/03/2023 11:19 AM   Modules accepted: Orders

## 2023-08-03 NOTE — Progress Notes (Signed)
    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   New sexual partner:  No.  On biologics for SLE  History of abnormal Pap: yes  Procedure Details  The risks and benefits of the procedure and Written informed consent obtained.  Speculum placed in vagina and excellent visualization of cervix achieved, cervix swabbed x 3 with acetic acid solution.  Findings: Inadequate colposcopy is noted today.  Cervix: dense AWE posterior cervix extension into the endocervical canal, ; no punctation, mosaicism or abnormal vessels. Vaginal inspection: normal without visible lesions. Vulvar colposcopy: vulvar colposcopy not performed.  Specimens: cervical biopsy 7 o'clock  Complications: none.  Colposcopic Impression: HSIL  Plan(Based on 2019 ASCCP recommendations) My Chart connect visit 08/13/23 to discuss results

## 2023-08-06 LAB — SURGICAL PATHOLOGY

## 2023-08-07 ENCOUNTER — Ambulatory Visit (HOSPITAL_COMMUNITY)
Admission: RE | Admit: 2023-08-07 | Discharge: 2023-08-07 | Disposition: A | Discharge status: Home / Self Care | Source: Ambulatory Visit | Attending: Internal Medicine | Admitting: Internal Medicine

## 2023-08-07 ENCOUNTER — Ambulatory Visit (HOSPITAL_COMMUNITY)

## 2023-08-07 DIAGNOSIS — N6002 Solitary cyst of left breast: Secondary | ICD-10-CM | POA: Diagnosis not present

## 2023-08-07 DIAGNOSIS — R928 Other abnormal and inconclusive findings on diagnostic imaging of breast: Secondary | ICD-10-CM | POA: Insufficient documentation

## 2023-08-07 DIAGNOSIS — R92333 Mammographic heterogeneous density, bilateral breasts: Secondary | ICD-10-CM | POA: Diagnosis not present

## 2023-08-07 DIAGNOSIS — N6012 Diffuse cystic mastopathy of left breast: Secondary | ICD-10-CM | POA: Diagnosis not present

## 2023-08-13 ENCOUNTER — Encounter: Payer: Self-pay | Admitting: Obstetrics & Gynecology

## 2023-08-13 ENCOUNTER — Telehealth: Admitting: Obstetrics & Gynecology

## 2023-08-13 DIAGNOSIS — R87613 High grade squamous intraepithelial lesion on cytologic smear of cervix (HGSIL): Secondary | ICD-10-CM

## 2023-08-13 NOTE — Progress Notes (Signed)
 MyChart connect visit: video + audio Pt is at home I am in my office Total time: 10 minutes     Follow up appointment for results: Cervical biopsy  Chief Complaint  Patient presents with   Results    Last menstrual period 10/26/2022.  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   Orders for this encounter: No orders of the defined types were placed in this encounter.   Impression + Management Plan   ICD-10-CM   1. High grade squamous intraepithelial cervical dysplasia: Laser conization 09/26/23 [facility]  Z61.096       Follow Up: Return in about 8 weeks (around 10/05/2023) for Post Op, with Dr Despina Hidden.     All questions were answered.  Past Medical History:  Diagnosis Date   Diabetes mellitus without complication (HCC)    Lupus     Past Surgical History:  Procedure Laterality Date   SHOULDER SURGERY Left    UTERINE FIBROID SURGERY     WRIST SURGERY Right     OB History     Gravida  2   Para  1   Term      Preterm  1   AB  1   Living         SAB  1   IAB      Ectopic      Multiple      Live Births              No Known Allergies  Social History   Socioeconomic History   Marital status: Married    Spouse name: Not on file   Number of children: Not on file   Years of education: Not on file   Highest education level: Not on file  Occupational History   Not on file  Tobacco Use   Smoking status: Every Day    Types: Cigarettes   Smokeless tobacco: Current  Vaping Use   Vaping status: Never Used  Substance and Sexual Activity   Alcohol use: No   Drug use: Never   Sexual activity: Yes    Birth control/protection: Post-menopausal  Other Topics Concern   Not on file  Social History Narrative   Not on file   Social Drivers of Health   Financial Resource Strain: Low Risk  (07/16/2023)    Overall Financial Resource Strain (CARDIA)    Difficulty of Paying Living Expenses: Not hard at all  Food Insecurity: No Food Insecurity (07/16/2023)   Hunger Vital Sign    Worried About Running Out of Food in the Last Year: Never true    Ran Out of Food in the Last Year: Never true  Transportation Needs: No Transportation Needs (07/16/2023)   PRAPARE - Administrator, Civil Service (Medical): No    Lack of Transportation (Non-Medical): No  Physical Activity: Inactive (07/16/2023)   Exercise Vital Sign    Days of Exercise per Week: 0 days    Minutes of Exercise per Session: 0 min  Stress: No Stress Concern Present (07/16/2023)   [employer] of Occupational Health - Occupational Stress Questionnaire    Feeling of Stress : Not at all  Social Connections: Moderately Isolated (07/16/2023)   Social Connection and Isolation Panel [NHANES]    Frequency of Communication with Friends and Family: More than three times a week  Frequency of Social Gatherings with Friends and Family: Twice a week    Attends Religious Services: Never    Database administrator or Organizations: No    Attends Banker Meetings: Never    Marital Status: Married    History reviewed. No pertinent family history.

## 2023-08-14 ENCOUNTER — Encounter: Payer: Self-pay | Admitting: Obstetrics & Gynecology

## 2023-08-16 DIAGNOSIS — R21 Rash and other nonspecific skin eruption: Secondary | ICD-10-CM | POA: Diagnosis not present

## 2023-08-16 DIAGNOSIS — M1990 Unspecified osteoarthritis, unspecified site: Secondary | ICD-10-CM | POA: Diagnosis not present

## 2023-08-16 DIAGNOSIS — M329 Systemic lupus erythematosus, unspecified: Secondary | ICD-10-CM | POA: Diagnosis not present

## 2023-08-16 DIAGNOSIS — R229 Localized swelling, mass and lump, unspecified: Secondary | ICD-10-CM | POA: Diagnosis not present

## 2023-08-22 DIAGNOSIS — E1065 Type 1 diabetes mellitus with hyperglycemia: Secondary | ICD-10-CM | POA: Diagnosis not present

## 2023-08-30 DIAGNOSIS — M329 Systemic lupus erythematosus, unspecified: Secondary | ICD-10-CM | POA: Diagnosis not present

## 2023-09-20 ENCOUNTER — Other Ambulatory Visit: Payer: Self-pay | Admitting: Obstetrics & Gynecology

## 2023-09-20 DIAGNOSIS — Z01818 Encounter for other preprocedural examination: Secondary | ICD-10-CM

## 2023-09-20 NOTE — Patient Instructions (Signed)
 [name]  09/20/2023     @PREFPERIOPPHARMACY @   Your procedure is scheduled on  09/26/2023.    Report to [name] at  1155 A.M.   Call this number if you have problems the morning of surgery:  [phone]  If you experience any cold or flu symptoms such as cough, fever, chills, shortness of breath, etc. between now and your scheduled surgery, please notify us  at the above number.   Remember:           Dial your insulin  pump down to your basal rate at bedtime on 09/25/2023.   Do not eat after midnight.   You may drink clear liquids until  0955 am on 09/26/2023.    Clear liquids allowed are:                    Water , Juice (No red color; non-citric and without pulp; diabetics please choose diet or no sugar options), Carbonated beverages (diabetics please choose diet or no sugar options), Clear Tea (No creamer, milk, or cream, including half & half and powdered creamer), Black Coffee Only (No creamer, milk or cream, including half & half and powdered creamer), and Clear Sports drink (No red color; diabetics please choose diet or no sugar options)           At 0955 am on 09/26/2023 drink 1-12 oz G2. You can have nothing else to drink after this.    Take these medicines the morning of surgery with A SIP OF WATER         amlodipine, lorazepam , prednisone, pregabalin, sertraline .    Do not wear jewelry, make-up or nail polish, including gel polish,  artificial nails, or any other type of covering on natural nails (fingers and  toes).  Do not wear lotions, powders, or perfumes, or deodorant.  Do not shave 48 hours prior to surgery.  Men may shave face and neck.  Do not bring valuables to the hospital.  [facility] is not responsible for any belongings or valuables.  Contacts, dentures or bridgework may not be worn into surgery.  Leave your suitcase in the car.  After surgery it may be brought to your room.  For patients admitted to the hospital, discharge time will be  determined by your treatment team.  Patients discharged the day of surgery will not be allowed to drive home and must have someone with them for 24 hours.    Special instructions:   DO NOT smoke tobacco or vape for 24 hours before your procedure.  Please read over the following fact sheets that you were given. Coughing and Deep Breathing, Surgical Site Infection Prevention, Anesthesia Post-op Instructions, and Care and Recovery After Surgery        Cervical Conization, Care After What can I expect after the procedure? A groggy feeling, if you were given medicine to make you fall asleep. Cramps that feel like menstrual cramps. Bloody discharge or light to moderate bleeding. Brown to black fluid coming from the vagina (vaginal discharge). This fluid may look like coffee grounds. This is from the paste that was put on the cervix to control bleeding. Follow these instructions at home: Your doctor may give you more instructions. If you have problems, contact your doctor. Medicines Take over-the-counter and prescription medicines only as told by your doctor. Do not take aspirin or ibuprofen until your doctor says it is okay. Activity  Rest as told by your doctor. For 7-14  days after your procedure, avoid: Being very active. Exercising. Heavy lifting. Do not lift anything that is heavier than 10 lb (4.5 kg), or the limit that you are told, until your doctor says that it is safe. Return to your normal activities when your doctor says that it is safe. General instructions You may eat your normal diet unless your doctor tells you not to. Drink enough fluid to keep your pee (urine) pale yellow. Do not take baths, swim, or use a hot tub until your doctor says it is okay. You may take showers. Do not douche, have sex, or put anything in the vagina, including tampons, until your doctor says it is okay. Keep all follow-up visits. Contact a doctor if: You faint or feel dizzy or  light-headed. You feel like you may vomit (nauseous). You vomit. You have fluid from your vagina that smells bad. You have a fever. You have blood in your pee or pain when you pee. Get help right away if: There are bloody clumps (clots) coming from your vagina. You have more bleeding than you would have in a normal menstrual period. For example, you soak a pad in less than 1 hour. You have very bad pain, or your pain gets worse and medicine does not help. Summary After the procedure, it is common to have cramps, bleeding from the vagina, and dark or bloody fluid from the vagina. Do not douche, have sex, or use tampons until your doctor says it is okay. For about 7-14 days after your procedure, try not to exercise or lift heavy things. Contact a doctor if you faint or feel dizzy or light-headed. Keep all follow-up visits. This information is not intended to replace advice given to you by your health care provider. Make sure you discuss any questions you have with your health care provider. Document Revised: 09/29/2020 Document Reviewed: 09/29/2020 Elsevier Patient Education  2024 Elsevier Inc.General Anesthesia, Adult, Care After The following information offers guidance on how to care for yourself after your procedure. Your health care provider may also give you more specific instructions. If you have problems or questions, contact your health care provider. What can I expect after the procedure? After the procedure, it is common for people to: Have pain or discomfort at the IV site. Have nausea or vomiting. Have a sore throat or hoarseness. Have trouble concentrating. Feel cold or chills. Feel weak, sleepy, or tired (fatigue). Have soreness and body aches. These can affect parts of the body that were not involved in surgery. Follow these instructions at home: For the time period you were told by your health care provider:  Rest. Do not participate in activities where you could fall  or become injured. Do not drive or use machinery. Do not drink alcohol. Do not take sleeping pills or medicines that cause drowsiness. Do not make important decisions or sign legal documents. Do not take care of children on your own. General instructions Drink enough fluid to keep your urine pale yellow. If you have sleep apnea, surgery and certain medicines can increase your risk for breathing problems. Follow instructions from your health care provider about wearing your sleep device: Anytime you are sleeping, including during daytime naps. While taking prescription pain medicines, sleeping medicines, or medicines that make you drowsy. Return to your normal activities as told by your health care provider. Ask your health care provider what activities are safe for you. Take over-the-counter and prescription medicines only as told by your health care provider. Do  not use any products that contain nicotine  or tobacco. These products include cigarettes, chewing tobacco, and vaping devices, such as e-cigarettes. These can delay incision healing after surgery. If you need help quitting, ask your health care provider. Contact a health care provider if: You have nausea or vomiting that does not get better with medicine. You vomit every time you eat or drink. You have pain that does not get better with medicine. You cannot urinate or have bloody urine. You develop a skin rash. You have a fever. Get help right away if: You have trouble breathing. You have chest pain. You vomit blood. These symptoms may be an emergency. Get help right away. Call 911. Do not wait to see if the symptoms will go away. Do not drive yourself to the hospital. Summary After the procedure, it is common to have a sore throat, hoarseness, nausea, vomiting, or to feel weak, sleepy, or fatigue. For the time period you were told by your health care provider, do not drive or use machinery. Get help right away if you have  difficulty breathing, have chest pain, or vomit blood. These symptoms may be an emergency. This information is not intended to replace advice given to you by your health care provider. Make sure you discuss any questions you have with your health care provider. Document Revised: 07/22/2021 Document Reviewed: 07/22/2021 Elsevier Patient Education  2024 Elsevier Inc.How to Use Chlorhexidine at Home in the Shower Chlorhexidine gluconate (CHG) is a germ-killing (antiseptic) wash that's used to clean the skin. It can get rid of the germs that normally live on the skin and can keep them away for about 24 hours. If you're having surgery, you may be told to shower with CHG at home the night before surgery. This can help lower your risk for infection. To use CHG wash in the shower, follow the steps below. Supplies needed: CHG body wash. Clean washcloth. Clean towel. How to use CHG in the shower Follow these steps unless you're told to use CHG in a different way: Start the shower. Use your normal soap and shampoo to wash your face and hair. Turn off the shower or move out of the shower stream. Pour CHG onto a clean washcloth. Do not use any type of brush or rough sponge. Start at your neck, washing your body down to your toes. Make sure you: Wash the part of your body where the surgery will be done for at least 1 minute. Do not scrub. Do not use CHG on your head or face unless your health care provider tells you to. If it gets into your ears or eyes, rinse them well with water . Do not wash your genitals with CHG. Wash your back and under your arms. Make sure to wash skin folds. Let the CHG sit on your skin for 1-2 minutes or as long as told. Rinse your entire body in the shower, including all body creases and folds. Turn off the shower. Dry off with a clean towel. Do not put anything on your skin afterward, such as powder, lotion, or perfume. Put on clean clothes or pajamas. If it's the night  before surgery, sleep in clean sheets. General tips Use CHG only as told, and follow the instructions on the label. Use the full amount of CHG as told. This is often one bottle. Do not smoke and stay away from flames after using CHG. Your skin may feel sticky after using CHG. This is normal. The sticky feeling will go away  as the CHG dries. Do not use CHG: If you have a chlorhexidine allergy or have reacted to chlorhexidine in the past. On open wounds or areas of skin that have broken skin, cuts, or scrapes. On babies younger than 91 months of age. Contact a health care provider if: You have questions about using CHG. Your skin gets irritated or itchy. You have a rash after using CHG. You swallow any CHG. Call your local poison control center [phone] in the [place] Your eyes itch badly, or they become very red or swollen. Your hearing changes. You have trouble seeing. If you can't reach your provider, go to an urgent care or emergency room. Do not drive yourself. Get help right away if: You have swelling or tingling in your mouth or throat. You make high-pitched whistling sounds when you breathe, most often when you breathe out (wheeze). You have trouble breathing. These symptoms may be an emergency. Call 911 right away. Do not wait to see if the symptoms will go away. Do not drive yourself to the hospital. This information is not intended to replace advice given to you by your health care provider. Make sure you discuss any questions you have with your health care provider. Document Revised: 11/07/2022 Document Reviewed: 11/03/2021 Elsevier Patient Education  2024 [employer].

## 2023-09-21 ENCOUNTER — Encounter (HOSPITAL_COMMUNITY): Payer: Self-pay

## 2023-09-21 ENCOUNTER — Other Ambulatory Visit: Payer: Self-pay

## 2023-09-21 ENCOUNTER — Encounter (HOSPITAL_COMMUNITY)
Admission: RE | Admit: 2023-09-21 | Discharge: 2023-09-21 | Disposition: A | Discharge status: Home / Self Care | Source: Ambulatory Visit | Attending: Obstetrics & Gynecology | Admitting: Obstetrics & Gynecology

## 2023-09-21 VITALS — BP 130/71 | HR 70 | Temp 98.0°F | Resp 18 | Ht 64.0 in | Wt 171.1 lb

## 2023-09-21 DIAGNOSIS — Z720 Tobacco use: Secondary | ICD-10-CM | POA: Diagnosis not present

## 2023-09-21 DIAGNOSIS — Z01818 Encounter for other preprocedural examination: Secondary | ICD-10-CM | POA: Insufficient documentation

## 2023-09-21 DIAGNOSIS — I10 Essential (primary) hypertension: Secondary | ICD-10-CM | POA: Diagnosis not present

## 2023-09-21 DIAGNOSIS — E1010 Type 1 diabetes mellitus with ketoacidosis without coma: Secondary | ICD-10-CM | POA: Insufficient documentation

## 2023-09-21 HISTORY — DX: Essential (primary) hypertension: I10

## 2023-09-21 LAB — CBC
HCT: 37.6 % (ref 36.0–46.0)
Hemoglobin: 12.3 g/dL (ref 12.0–15.0)
MCH: 28 pg (ref 26.0–34.0)
MCHC: 32.7 g/dL (ref 30.0–36.0)
MCV: 85.5 fL (ref 80.0–100.0)
Platelets: 409 10*3/uL — ABNORMAL HIGH (ref 150–400)
RBC: 4.4 MIL/uL (ref 3.87–5.11)
RDW: 15 % (ref 11.5–15.5)
WBC: 8.1 10*3/uL (ref 4.0–10.5)
nRBC: 0 % (ref 0.0–0.2)

## 2023-09-21 LAB — URINALYSIS, ROUTINE W REFLEX MICROSCOPIC
Bacteria, UA: NONE SEEN
Bilirubin Urine: NEGATIVE
Glucose, UA: >=500 mg/dL — AB
Hgb urine dipstick: NEGATIVE
Ketones, ur: NEGATIVE mg/dL
Leukocytes,Ua: NEGATIVE
Nitrite: NEGATIVE
Protein, ur: NEGATIVE mg/dL
Specific Gravity, Urine: 1.011 (ref 1.005–1.030)
pH: 5 (ref 5.0–8.0)

## 2023-09-21 LAB — RAPID HIV SCREEN (HIV 1/2 AB+AG)
HIV 1/2 Antibodies: NONREACTIVE
HIV-1 P24 Antigen - HIV24: NONREACTIVE

## 2023-09-21 LAB — PREGNANCY, URINE: Preg Test, Ur: NEGATIVE

## 2023-09-21 LAB — COMPREHENSIVE METABOLIC PANEL WITH GFR
ALT: 12 U/L (ref 0–44)
AST: 14 U/L — ABNORMAL LOW (ref 15–41)
Albumin: 3.9 g/dL (ref 3.5–5.0)
Alkaline Phosphatase: 65 U/L (ref 38–126)
Anion gap: 12 (ref 5–15)
BUN: 32 mg/dL — ABNORMAL HIGH (ref 6–20)
CO2: 19 mmol/L — ABNORMAL LOW (ref 22–32)
Calcium: 9.7 mg/dL (ref 8.9–10.3)
Chloride: 99 mmol/L (ref 98–111)
Creatinine, Ser: 1.31 mg/dL — ABNORMAL HIGH (ref 0.44–1.00)
GFR, Estimated: 51 mL/min — ABNORMAL LOW (ref >60)
Glucose, Bld: 302 mg/dL — ABNORMAL HIGH (ref 70–99)
Potassium: 4.7 mmol/L (ref 3.5–5.1)
Sodium: 130 mmol/L — ABNORMAL LOW (ref 135–145)
Total Bilirubin: 0.6 mg/dL (ref 0.0–1.2)
Total Protein: 6.8 g/dL (ref 6.5–8.1)

## 2023-09-21 LAB — HEMOGLOBIN A1C
Hgb A1c MFr Bld: 9.6 % — ABNORMAL HIGH (ref 4.8–5.6)
Mean Plasma Glucose: 228.82 mg/dL

## 2023-09-25 NOTE — OR Nursing (Signed)
 Dr. Randolm Butte messaged with lab results

## 2023-09-25 NOTE — OR Nursing (Signed)
 Glucose 302 reported to Dr. Porter Britain. Also aware that patient has insulin  pump. Sodium 130

## 2023-09-26 ENCOUNTER — Encounter (HOSPITAL_COMMUNITY)
Admission: RE | Disposition: A | Discharge status: Home / Self Care | Payer: Self-pay | Source: Home / Self Care | Attending: Obstetrics & Gynecology

## 2023-09-26 ENCOUNTER — Ambulatory Visit (HOSPITAL_COMMUNITY): Admitting: Certified Registered"

## 2023-09-26 ENCOUNTER — Ambulatory Visit (HOSPITAL_COMMUNITY)
Admission: RE | Admit: 2023-09-26 | Discharge: 2023-09-26 | Disposition: A | Discharge status: Home / Self Care | Attending: Obstetrics & Gynecology | Admitting: Obstetrics & Gynecology

## 2023-09-26 ENCOUNTER — Encounter (HOSPITAL_COMMUNITY): Payer: Self-pay | Admitting: Obstetrics & Gynecology

## 2023-09-26 ENCOUNTER — Other Ambulatory Visit: Payer: Self-pay

## 2023-09-26 DIAGNOSIS — E119 Type 2 diabetes mellitus without complications: Secondary | ICD-10-CM | POA: Diagnosis not present

## 2023-09-26 DIAGNOSIS — N189 Chronic kidney disease, unspecified: Secondary | ICD-10-CM | POA: Diagnosis not present

## 2023-09-26 DIAGNOSIS — Z794 Long term (current) use of insulin: Secondary | ICD-10-CM | POA: Insufficient documentation

## 2023-09-26 DIAGNOSIS — I10 Essential (primary) hypertension: Secondary | ICD-10-CM | POA: Insufficient documentation

## 2023-09-26 DIAGNOSIS — D849 Immunodeficiency, unspecified: Secondary | ICD-10-CM | POA: Diagnosis not present

## 2023-09-26 DIAGNOSIS — F17210 Nicotine dependence, cigarettes, uncomplicated: Secondary | ICD-10-CM | POA: Insufficient documentation

## 2023-09-26 DIAGNOSIS — N870 Mild cervical dysplasia: Secondary | ICD-10-CM | POA: Diagnosis not present

## 2023-09-26 DIAGNOSIS — M329 Systemic lupus erythematosus, unspecified: Secondary | ICD-10-CM | POA: Diagnosis not present

## 2023-09-26 DIAGNOSIS — R87613 High grade squamous intraepithelial lesion on cytologic smear of cervix (HGSIL): Secondary | ICD-10-CM | POA: Diagnosis not present

## 2023-09-26 DIAGNOSIS — E1122 Type 2 diabetes mellitus with diabetic chronic kidney disease: Secondary | ICD-10-CM | POA: Diagnosis not present

## 2023-09-26 HISTORY — PX: CERVICAL CONIZATION W/BX: SHX1330

## 2023-09-26 LAB — GLUCOSE, CAPILLARY
Glucose-Capillary: 257 mg/dL — ABNORMAL HIGH (ref 70–99)
Glucose-Capillary: 189 mg/dL — ABNORMAL HIGH (ref 70–99)
Glucose-Capillary: 311 mg/dL — ABNORMAL HIGH (ref 70–99)

## 2023-09-26 SURGERY — CONE BIOPSY, CERVIX
Anesthesia: General | Site: Cervix

## 2023-09-26 MED ORDER — CEFAZOLIN SODIUM-DEXTROSE 2-4 GM/100ML-% IV SOLN
2.0000 g | INTRAVENOUS | Status: AC
  Administered 2023-09-26: 2 g via INTRAVENOUS
  Filled 2023-09-26: qty 100

## 2023-09-26 MED ORDER — EPHEDRINE SULFATE-NACL 50-0.9 MG/10ML-% IV SOSY
PREFILLED_SYRINGE | INTRAVENOUS | Status: DC | PRN
  Administered 2023-09-26: 5 mg via INTRAVENOUS
  Administered 2023-09-26: 10 mg via INTRAVENOUS

## 2023-09-26 MED ORDER — PHENYLEPHRINE 80 MCG/ML (10ML) SYRINGE FOR IV PUSH (FOR BLOOD PRESSURE SUPPORT)
PREFILLED_SYRINGE | INTRAVENOUS | Status: AC
  Filled 2023-09-26: qty 20

## 2023-09-26 MED ORDER — DEXAMETHASONE SODIUM PHOSPHATE 10 MG/ML IJ SOLN
INTRAMUSCULAR | Status: AC
  Filled 2023-09-26: qty 1

## 2023-09-26 MED ORDER — MIDAZOLAM HCL 5 MG/5ML IJ SOLN
INTRAMUSCULAR | Status: DC | PRN
  Administered 2023-09-26: 2 mg via INTRAVENOUS

## 2023-09-26 MED ORDER — ONDANSETRON HCL 4 MG/2ML IJ SOLN
4.0000 mg | Freq: Once | INTRAMUSCULAR | Status: DC | PRN
  Filled 2023-09-26: qty 2

## 2023-09-26 MED ORDER — ORAL CARE MOUTH RINSE: 15.0000 mL | Freq: Once | OROMUCOSAL | Status: AC

## 2023-09-26 MED ORDER — KETOROLAC TROMETHAMINE 10 MG PO TABS: 10.0000 mg | ORAL_TABLET | Freq: Three times a day (TID) | ORAL | 0 refills | Status: DC | PRN

## 2023-09-26 MED ORDER — LIDOCAINE 2% (20 MG/ML) 5 ML SYRINGE
INTRAMUSCULAR | Status: DC | PRN
  Administered 2023-09-26: 80 mg via INTRAVENOUS

## 2023-09-26 MED ORDER — GLYCOPYRROLATE PF 0.2 MG/ML IJ SOSY
PREFILLED_SYRINGE | INTRAMUSCULAR | Status: AC
  Filled 2023-09-26: qty 1

## 2023-09-26 MED ORDER — CHLORHEXIDINE GLUCONATE 0.12 % MT SOLN
15.0000 mL | Freq: Once | OROMUCOSAL | Status: AC
  Administered 2023-09-26: 15 mL via OROMUCOSAL

## 2023-09-26 MED ORDER — FENTANYL CITRATE (PF) 100 MCG/2ML IJ SOLN
INTRAMUSCULAR | Status: AC
Start: 2023-09-26 — End: ?
  Filled 2023-09-26: qty 2

## 2023-09-26 MED ORDER — LACTATED RINGERS IV SOLN: INTRAVENOUS | Status: DC

## 2023-09-26 MED ORDER — POVIDONE-IODINE 10 % EX SWAB
2.0000 | Freq: Once | CUTANEOUS | Status: AC
  Administered 2023-09-26: 2 via TOPICAL

## 2023-09-26 MED ORDER — ONDANSETRON 8 MG PO TBDP: 8.0000 mg | ORAL_TABLET | Freq: Three times a day (TID) | ORAL | 0 refills | Status: DC | PRN

## 2023-09-26 MED ORDER — MONSELS FERRIC SUBSULFATE EX SOLN
CUTANEOUS | Status: AC
  Filled 2023-09-26: qty 8

## 2023-09-26 MED ORDER — LIDOCAINE 2% (20 MG/ML) 5 ML SYRINGE
INTRAMUSCULAR | Status: AC
  Filled 2023-09-26: qty 5

## 2023-09-26 MED ORDER — HYDROCODONE-ACETAMINOPHEN 7.5-325 MG PO TABS
1.0000 | ORAL_TABLET | Freq: Once | ORAL | Status: AC | PRN
  Administered 2023-09-26: 1 via ORAL
  Filled 2023-09-26: qty 1

## 2023-09-26 MED ORDER — ONDANSETRON HCL 4 MG/2ML IJ SOLN
INTRAMUSCULAR | Status: DC | PRN
  Administered 2023-09-26 (×2): 4 mg via INTRAVENOUS

## 2023-09-26 MED ORDER — WATER FOR IRRIGATION, STERILE IR SOLN
Status: DC | PRN
  Administered 2023-09-26: 500 mL

## 2023-09-26 MED ORDER — KETOROLAC TROMETHAMINE 30 MG/ML IJ SOLN
30.0000 mg | INTRAMUSCULAR | Status: AC
  Administered 2023-09-26: 30 mg via INTRAVENOUS
  Filled 2023-09-26: qty 1

## 2023-09-26 MED ORDER — HYDROCODONE-ACETAMINOPHEN 5-325 MG PO TABS: 1.0000 | ORAL_TABLET | Freq: Four times a day (QID) | ORAL | 0 refills | Status: DC | PRN

## 2023-09-26 MED ORDER — FENTANYL CITRATE (PF) 100 MCG/2ML IJ SOLN
INTRAMUSCULAR | Status: DC | PRN
  Administered 2023-09-26: 25 ug via INTRAVENOUS
  Administered 2023-09-26 (×2): 50 ug via INTRAVENOUS
  Administered 2023-09-26: 25 ug via INTRAVENOUS

## 2023-09-26 MED ORDER — EPHEDRINE 5 MG/ML INJ
INTRAVENOUS | Status: AC
  Filled 2023-09-26: qty 10

## 2023-09-26 MED ORDER — ONDANSETRON HCL 4 MG/2ML IJ SOLN
INTRAMUSCULAR | Status: AC
  Filled 2023-09-26: qty 2

## 2023-09-26 MED ORDER — FENTANYL CITRATE (PF) 100 MCG/2ML IJ SOLN
INTRAMUSCULAR | Status: AC
  Filled 2023-09-26: qty 2

## 2023-09-26 MED ORDER — ACETIC ACID 5 % SOLN
Status: DC | PRN
  Administered 2023-09-26: 1 via TOPICAL

## 2023-09-26 MED ORDER — MONSELS FERRIC SUBSULFATE EX SOLN
CUTANEOUS | Status: DC | PRN
  Administered 2023-09-26: 1 via TOPICAL

## 2023-09-26 MED ORDER — MIDAZOLAM HCL 2 MG/2ML IJ SOLN
INTRAMUSCULAR | Status: AC
  Filled 2023-09-26: qty 2

## 2023-09-26 MED ORDER — PROPOFOL 500 MG/50ML IV EMUL
INTRAVENOUS | Status: AC
  Filled 2023-09-26: qty 50

## 2023-09-26 MED ORDER — PROPOFOL 10 MG/ML IV BOLUS
INTRAVENOUS | Status: DC | PRN
  Administered 2023-09-26: 50 mg via INTRAVENOUS
  Administered 2023-09-26: 150 mg via INTRAVENOUS

## 2023-09-26 SURGICAL SUPPLY — 21 items
APPLICATOR COTTON TIP 6 STRL (MISCELLANEOUS) ×1 IMPLANT
APPLICATOR COTTON TIP 6IN STRL (MISCELLANEOUS) ×1 IMPLANT
COVER LIGHT HANDLE STERIS (MISCELLANEOUS) ×1 IMPLANT
ELECTRODE REM PT RTRN 9FT ADLT (ELECTROSURGICAL) ×1 IMPLANT
GLOVE BIOGEL PI IND STRL 7.0 (GLOVE) ×2 IMPLANT
GLOVE BIOGEL PI IND STRL 8 (GLOVE) ×1 IMPLANT
GLOVE ECLIPSE 8.0 STRL XLNG CF (GLOVE) ×2 IMPLANT
GOWN STRL REUS W/TWL LRG LVL3 (GOWN DISPOSABLE) ×1 IMPLANT
GOWN STRL REUS W/TWL XL LVL3 (GOWN DISPOSABLE) ×1 IMPLANT
KIT TURNOVER KIT A (KITS) ×1 IMPLANT
LASER FIBER DISP 1000U (UROLOGICAL SUPPLIES) ×1 IMPLANT
MANIFOLD NEPTUNE II (INSTRUMENTS) ×1 IMPLANT
PACK SRG BSC III STRL LF ECLPS (CUSTOM PROCEDURE TRAY) ×1 IMPLANT
PAD ARMBOARD POSITIONER FOAM (MISCELLANEOUS) ×1 IMPLANT
POSITIONER HEAD 8X9X4 ADT (SOFTGOODS) ×1 IMPLANT
SCOPETTES 8 STERILE (MISCELLANEOUS) ×1 IMPLANT
SET BASIN LINEN APH (SET/KITS/TRAYS/PACK) ×1 IMPLANT
SHEET LAVH (DRAPES) ×1 IMPLANT
SUT SILK 2 0 SH (SUTURE) ×1 IMPLANT
TUBING SMOKE EVAC CO2 (TUBING) ×1 IMPLANT
WATER STERILE IRR 1000ML POUR (IV SOLUTION) ×1 IMPLANT

## 2023-09-26 NOTE — Transfer of Care (Addendum)
 Immediate Anesthesia Transfer of Care Note  Patient: [name]  Procedure(s) Performed: CONE BIOPSY, CERVIX (Cervix)  Patient Location: PACU  Anesthesia Type:General  Level of Consciousness: awake, alert , oriented, patient cooperative, and responds to stimulation  Airway & Oxygen Therapy: Patient Spontanous Breathing and Patient connected to face mask oxygen  Post-op Assessment: Report given to RN  Post vital signs: Reviewed and stable  Last Vitals:  Vitals Value Taken Time  BP 144/47 09/26/23 1445  Temp 36.7 C 09/26/23 1437  Pulse 90 09/26/23 1448  Resp 17 09/26/23 1448  SpO2 96 % 09/26/23 1448  Vitals shown include unfiled device data.  Last Pain:  Vitals:   09/26/23 1136  TempSrc:   PainSc: 0-No pain         Complications: Patient nauseous in PACU. Additional dose of zofran  4 mg administered intravenously. PACU RN present and aware.

## 2023-09-26 NOTE — Anesthesia Procedure Notes (Addendum)
 Procedure Name: LMA Insertion Date/Time: 09/26/2023 1:29 PM  Performed by: Beacher Limerick, MDPre-anesthesia Checklist: Patient identified, Emergency Drugs available, Suction available, Timeout performed and Patient being monitored Patient Re-evaluated:Patient Re-evaluated prior to induction Oxygen Delivery Method: Circle system utilized Preoxygenation: Pre-oxygenation with 100% oxygen Induction Type: IV induction Ventilation: Mask ventilation without difficulty LMA: LMA inserted LMA Size: 4.0 Number of attempts: 1 Placement Confirmation: positive ETCO2, breath sounds checked- equal and bilateral and CO2 detector Tube secured with: Tape Dental Injury: Teeth and Oropharynx as per pre-operative assessment  Comments: Atraumatic insertion of LMA size 4. Lips and teeth remain in preoperative condition.

## 2023-09-26 NOTE — Op Note (Signed)
 Preoperative diagnosis:  1. High grade dysplasia on cervical biopsy                                         2.  Inadequate colposcopy, lesion extending into the endocervical canal  Postoperative Diagnosis:  Same as above  Procedure:  Cervical conization using laser,  ablation of cervical bed using laser  Surgeon:  Laron Plummer MD  Anesthesia:  Laryngeal mask airway  Findings:  Colpo directed biopsy from office revealed HSIL and lesion seen extending into the endocervical canal Today at the time of surgery a repeat colposcopy was performed using 3% acetic acid and the lesion was once again confirmed.  There  were no new findings today.  Description of operation:  Patient was taken to the operating room and placed in the supine position where she underwent laryngeal mask airway anesthesia.  She was then placed in the high lithotomy position using candy cane stirrups.  She was then draped out for a laser procedure.  The microscope was used and 3% acetic acid was placed on the cervix.  The holmium laser was then employed at a power of 2.5 and rates between 8 and 15.  I achieved a couple millimeter margin around lesions both at 12:00 and 6:00 the laser was used to perform a conization.  The specimen was removed and sent to pathology for evaluation.  As is always the case with laser I did achieve at an appropriate margin around the disease with shrinkage of the tissue during the procedure it may appear to be a positive lateral margin.  However the surgical margin is indeed clear.  I then used the laser to ablate the conization bed to a depth of 5-7 mm laterally coning down to 9 mm centrally and began getting good surgical margin.  Additional hemostasis was achieved using Monsel solution.  In the conization bed was completely hemostatic.  Blood loss for the procedure was none.  The patient received Ancef 2 grams and Toradol  30 mg IV preoperatively.  The patient was awakened from anesthesia and taken to  the recovery room in good stable condition with all counts being correct.  She will be followed up in the office in one month for evaluation of the conization bed.  Wendelyn Halter, MD 09/26/2023 2:31 PM

## 2023-09-26 NOTE — H&P (Signed)
 Preoperative History and Physical  [name] is a 43 y.o. G2P0110 with Patient's last menstrual period was 10/26/2022. 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  Chronic immunosuppression  PMH:    Past Medical History:  Diagnosis Date   Diabetes mellitus without complication (HCC)    Hypertension    Lupus     PSH:     Past Surgical History:  Procedure Laterality Date   SHOULDER SURGERY Left    UTERINE FIBROID SURGERY     WRIST SURGERY Right     POb/GynH:      OB History     Gravida  2   Para  1   Term      Preterm  1   AB  1   Living         SAB  1   IAB      Ectopic      Multiple      Live Births              SH:   Social History   Tobacco Use   Smoking status: Every Day    Current packs/day: 1.00    Types: Cigarettes   Smokeless tobacco: Current  Vaping Use   Vaping status: Never Used  Substance Use Topics   Alcohol use: No   Drug use: Never    FH:   History reviewed. No pertinent family history.   Allergies:  Allergies  Allergen Reactions   Strawberry Extract Dermatitis    Medications:       Current Facility-Administered Medications:    ceFAZolin (ANCEF) IVPB 2g/100 mL premix, 2 g, Intravenous, On Call to OR, Ayven Pheasant, Sixto Duhamel, MD   lactated ringers infusion, , Intravenous, Continuous, Beacher Limerick, MD, Last Rate: 10 mL/hr at 09/26/23 1136, New Bag at 09/26/23 1136  Review of Systems:   Review of Systems  Constitutional: Negative for fever, chills, weight loss, malaise/fatigue and diaphoresis.  HENT: Negative for hearing loss, ear pain, nosebleeds, congestion, sore throat, neck pain, tinnitus and ear discharge.   Eyes: Negative for blurred vision, double vision, photophobia, pain, discharge and redness.  Respiratory: Negative for cough, hemoptysis, sputum production, shortness of breath, wheezing and stridor.   Cardiovascular: Negative for chest pain,  palpitations, orthopnea, claudication, leg swelling and PND.  Gastrointestinal: Positive for abdominal pain. Negative for heartburn, nausea, vomiting, diarrhea, constipation, blood in stool and melena.  Genitourinary: Negative for dysuria, urgency, frequency, hematuria and flank pain.  Musculoskeletal: Negative for myalgias, back pain, joint pain and falls.  Skin: Negative for itching and rash.  Neurological: Negative for dizziness, tingling, tremors, sensory change, speech change, focal weakness, seizures, loss of consciousness, weakness and headaches.  Endo/Heme/Allergies: Negative for environmental allergies and polydipsia. Does not bruise/bleed easily.  Psychiatric/Behavioral: Negative for depression, suicidal ideas, hallucinations, memory loss and substance abuse. The patient is not nervous/anxious and does not have insomnia.      PHYSICAL EXAM:  Blood pressure 121/65, pulse 80, temperature 98.5 F (36.9 C), temperature source Oral, resp. rate 16, height 5\' 4"  (1.626 m), weight 77.6 kg, last menstrual period 10/26/2022, SpO2 100%.    Vitals reviewed. Constitutional: She is oriented to person, place, and time. She appears well-developed and well-nourished.  HENT:  Head: Normocephalic and atraumatic.  Right Ear: External ear normal.  Left Ear: External ear normal.  Nose: Nose normal.  Mouth/Throat: Oropharynx is clear and moist.  Eyes: Conjunctivae and EOM are  normal. Pupils are equal, round, and reactive to light. Right eye exhibits no discharge. Left eye exhibits no discharge. No scleral icterus.  Neck: Normal range of motion. Neck supple. No tracheal deviation present. No thyromegaly present.  Cardiovascular: Normal rate, regular rhythm, normal heart sounds and intact distal pulses.  Exam reveals no gallop and no friction rub.   No murmur heard. Respiratory: Effort normal and breath sounds normal. No respiratory distress. She has no wheezes. She has no rales. She exhibits no  tenderness.  GI: Soft. Bowel sounds are normal. She exhibits no distension and no mass. There is tenderness. There is no rebound and no guarding.  Genitourinary:       Vulva is normal without lesions Vagina is pink moist without discharge Cervix normal in appearance and pap is normal Uterus is normal size, contour, position, consistency, mobility, non-tender Adnexa is negative with normal sized ovaries by sonogram  Musculoskeletal: Normal range of motion. She exhibits no edema and no tenderness.  Neurological: She is alert and oriented to person, place, and time. She has normal reflexes. She displays normal reflexes. No cranial nerve deficit. She exhibits normal muscle tone. Coordination normal.  Skin: Skin is warm and dry. No rash noted. No erythema. No pallor.  Psychiatric: She has a normal mood and affect. Her behavior is normal. Judgment and thought content normal.    Labs: Results for orders placed or performed during the hospital encounter of 09/26/23 (from the past 2 weeks)  Glucose, capillary   Collection Time: 09/26/23 11:04 AM  Result Value Ref Range   Glucose-Capillary 257 (H) 70 - 99 mg/dL  Glucose, capillary   Collection Time: 09/26/23 12:43 PM  Result Value Ref Range   Glucose-Capillary 189 (H) 70 - 99 mg/dL  Results for orders placed or performed during the hospital encounter of 09/21/23 (from the past 2 weeks)  Hemoglobin A1c   Collection Time: 09/21/23 10:02 AM  Result Value Ref Range   Hgb A1c MFr Bld 9.6 (H) 4.8 - 5.6 %   Mean Plasma Glucose 228.82 mg/dL  CBC   Collection Time: 09/21/23 10:02 AM  Result Value Ref Range   WBC 8.1 4.0 - 10.5 K/uL   RBC 4.40 3.87 - 5.11 MIL/uL   Hemoglobin 12.3 12.0 - 15.0 g/dL   HCT 16.1 09.6 - 04.5 %   MCV 85.5 80.0 - 100.0 fL   MCH 28.0 26.0 - 34.0 pg   MCHC 32.7 30.0 - 36.0 g/dL   RDW 40.9 81.1 - 91.4 %   Platelets 409 (H) 150 - 400 K/uL   nRBC 0.0 0.0 - 0.2 %  Comprehensive metabolic panel   Collection Time: 09/21/23  10:02 AM  Result Value Ref Range   Sodium 130 (L) 135 - 145 mmol/L   Potassium 4.7 3.5 - 5.1 mmol/L   Chloride 99 98 - 111 mmol/L   CO2 19 (L) 22 - 32 mmol/L   Glucose, Bld 302 (H) 70 - 99 mg/dL   BUN 32 (H) 6 - 20 mg/dL   Creatinine, Ser 7.82 (H) 0.44 - 1.00 mg/dL   Calcium  9.7 8.9 - 10.3 mg/dL   Total Protein 6.8 6.5 - 8.1 g/dL   Albumin 3.9 3.5 - 5.0 g/dL   AST 14 (L) 15 - 41 U/L   ALT 12 0 - 44 U/L   Alkaline Phosphatase 65 38 - 126 U/L   Total Bilirubin 0.6 0.0 - 1.2 mg/dL   GFR, Estimated 51 (L) >60 mL/min   Anion gap  12 5 - 15  Rapid HIV screen (HIV 1/2 Ab+Ag)   Collection Time: 09/21/23 10:02 AM  Result Value Ref Range   HIV-1 P24 Antigen - HIV24 NON REACTIVE NON REACTIVE   HIV 1/2 Antibodies NON REACTIVE NON REACTIVE   Interpretation (HIV Ag Ab)      A non reactive test result means that HIV 1 or HIV 2 antibodies and HIV 1 p24 antigen were not detected in the specimen.  Urinalysis, Routine w reflex microscopic -Urine, Clean Catch   Collection Time: 09/21/23 10:02 AM  Result Value Ref Range   Color, Urine YELLOW YELLOW   APPearance CLEAR CLEAR   Specific Gravity, Urine 1.011 1.005 - 1.030   pH 5.0 5.0 - 8.0   Glucose, UA >=500 (A) NEGATIVE mg/dL   Hgb urine dipstick NEGATIVE NEGATIVE   Bilirubin Urine NEGATIVE NEGATIVE   Ketones, ur NEGATIVE NEGATIVE mg/dL   Protein, ur NEGATIVE NEGATIVE mg/dL   Nitrite NEGATIVE NEGATIVE   Leukocytes,Ua NEGATIVE NEGATIVE   RBC / HPF 0-5 0 - 5 RBC/hpf   WBC, UA 0-5 0 - 5 WBC/hpf   Bacteria, UA NONE SEEN NONE SEEN   Squamous Epithelial / HPF 0-5 0 - 5 /HPF  Pregnancy, urine   Collection Time: 09/21/23 10:02 AM  Result Value Ref Range   Preg Test, Ur NEGATIVE NEGATIVE    EKG: Orders placed or performed during the hospital encounter of 09/21/23   EKG 12-LEAD   EKG 12-LEAD    Imaging Studies: No results found.    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

## 2023-09-26 NOTE — Anesthesia Preprocedure Evaluation (Addendum)
 Anesthesia Evaluation  Patient identified by MRN, date of birth, ID band Patient awake    Reviewed: Allergy & Precautions, H&P , NPO status , Patient's Chart, lab work & pertinent test results  Airway Mallampati: II  TM Distance: >3 FB Neck ROM: Full    Dental no notable dental hx.    Pulmonary pneumonia, Current Smoker   Pulmonary exam normal breath sounds clear to auscultation       Cardiovascular hypertension, Normal cardiovascular exam Rhythm:Regular Rate:Normal     Neuro/Psych negative neurological ROS  negative psych ROS   GI/Hepatic negative GI ROS, Neg liver ROS,,,  Endo/Other  diabetes, Insulin  Dependent    Renal/GU Renal InsufficiencyRenal disease  negative genitourinary   Musculoskeletal negative musculoskeletal ROS (+)    Abdominal   Peds negative pediatric ROS (+)  Hematology  (+) Blood dyscrasia, anemia   Anesthesia Other Findings SLE  Reproductive/Obstetrics negative OB ROS                             Anesthesia Physical Anesthesia Plan  ASA: 3  Anesthesia Plan: General   Post-op Pain Management:    Induction: Intravenous  PONV Risk Score and Plan:   Airway Management Planned: LMA  Additional Equipment:   Intra-op Plan:   Post-operative Plan: Extubation in OR  Informed Consent: I have reviewed the patients History and Physical, chart, labs and discussed the procedure including the risks, benefits and alternatives for the proposed anesthesia with the patient or authorized representative who has indicated his/her understanding and acceptance.     Dental advisory given  Plan Discussed with: CRNA  Anesthesia Plan Comments:        Anesthesia Quick Evaluation

## 2023-09-26 NOTE — Anesthesia Postprocedure Evaluation (Signed)
 Anesthesia Post Note  Patient: [name]  Procedure(s) Performed: CONE BIOPSY, CERVIX (Cervix)  Anesthesia Type: General Anesthetic complications: no   There were no known notable events for this encounter.   Last Vitals:  Vitals:   09/26/23 1515 09/26/23 1533  BP: (!) 129/58 (!) 129/59  Pulse: 83 87  Resp: 20 20  Temp:  36.9 C  SpO2: 97% 100%    Last Pain:  Vitals:   09/26/23 1533  TempSrc: Oral  PainSc: 3                  Beacher Limerick

## 2023-09-27 ENCOUNTER — Encounter (HOSPITAL_COMMUNITY): Payer: Self-pay | Admitting: Obstetrics & Gynecology

## 2023-10-02 DIAGNOSIS — M329 Systemic lupus erythematosus, unspecified: Secondary | ICD-10-CM | POA: Diagnosis not present

## 2023-10-02 LAB — SURGICAL PATHOLOGY

## 2023-10-16 ENCOUNTER — Ambulatory Visit: Admitting: Pulmonary Disease

## 2023-10-22 DIAGNOSIS — M1990 Unspecified osteoarthritis, unspecified site: Secondary | ICD-10-CM | POA: Diagnosis not present

## 2023-10-22 DIAGNOSIS — R21 Rash and other nonspecific skin eruption: Secondary | ICD-10-CM | POA: Diagnosis not present

## 2023-10-22 DIAGNOSIS — M329 Systemic lupus erythematosus, unspecified: Secondary | ICD-10-CM | POA: Diagnosis not present

## 2023-10-22 DIAGNOSIS — R229 Localized swelling, mass and lump, unspecified: Secondary | ICD-10-CM | POA: Diagnosis not present

## 2023-10-30 DIAGNOSIS — M329 Systemic lupus erythematosus, unspecified: Secondary | ICD-10-CM | POA: Diagnosis not present

## 2023-11-21 DIAGNOSIS — E1065 Type 1 diabetes mellitus with hyperglycemia: Secondary | ICD-10-CM | POA: Diagnosis not present

## 2023-11-27 DIAGNOSIS — M329 Systemic lupus erythematosus, unspecified: Secondary | ICD-10-CM | POA: Diagnosis not present

## 2023-11-29 DIAGNOSIS — M2550 Pain in unspecified joint: Secondary | ICD-10-CM | POA: Diagnosis not present

## 2023-11-29 DIAGNOSIS — E7800 Pure hypercholesterolemia, unspecified: Secondary | ICD-10-CM | POA: Diagnosis not present

## 2023-11-29 DIAGNOSIS — M329 Systemic lupus erythematosus, unspecified: Secondary | ICD-10-CM | POA: Diagnosis not present

## 2023-11-29 DIAGNOSIS — E1065 Type 1 diabetes mellitus with hyperglycemia: Secondary | ICD-10-CM | POA: Diagnosis not present

## 2023-12-18 ENCOUNTER — Encounter: Payer: Self-pay | Admitting: Pulmonary Disease

## 2023-12-18 ENCOUNTER — Ambulatory Visit: Admitting: Pulmonary Disease

## 2023-12-18 VITALS — BP 166/84 | HR 86 | Temp 97.8°F | Ht 63.0 in | Wt 166.4 lb

## 2023-12-18 DIAGNOSIS — R918 Other nonspecific abnormal finding of lung field: Secondary | ICD-10-CM | POA: Diagnosis not present

## 2023-12-18 DIAGNOSIS — F17210 Nicotine dependence, cigarettes, uncomplicated: Secondary | ICD-10-CM | POA: Diagnosis not present

## 2023-12-18 DIAGNOSIS — M329 Systemic lupus erythematosus, unspecified: Secondary | ICD-10-CM

## 2023-12-18 DIAGNOSIS — J849 Interstitial pulmonary disease, unspecified: Secondary | ICD-10-CM | POA: Diagnosis not present

## 2023-12-18 MED ORDER — VARENICLINE TARTRATE (STARTER) 0.5 MG X 11 & 1 MG X 42 PO TBPK
1.0000 | ORAL_TABLET | Freq: Every day | ORAL | 2 refills | Status: DC
Start: 2023-12-18 — End: 2024-03-19

## 2023-12-18 MED ORDER — NICOTINE 14 MG/24HR TD PT24: 14.0000 mg | MEDICATED_PATCH | Freq: Every day | TRANSDERMAL | 0 refills | Status: DC

## 2023-12-18 NOTE — Progress Notes (Signed)
 [name]    [id number]    1981-03-26  Primary Care Physician:Fusco, Jerilynn, MD  Referring Physician: Norita Miquel [name], MD [address] [place] [address] [place],  [place] [address]  Chief complaint: Consult for ILD evaluation  HPI: 43 y.o. who  has a past medical history of Diabetes mellitus without complication (HCC), Hypertension, and Lupus.  Discussed the use of AI scribe software for clinical note transcription with the patient, who gave verbal consent to proceed.  History of Present Illness [name] is a 43 year old female with lupus who presents for a pulmonary evaluation following a CT scan.  Pulmonary symptoms and imaging - No current lung-related symptoms including wheezing or chest congestion - No use of inhalers - Occasional wheezing and chest congestion attributed to sinus issues - No significant lung symptoms - No history of asthma - CT scan performed in January for pulmonary evaluation  Upper respiratory and allergy symptoms - Current sinus congestion - No cold-induced color changes in fingers  Connective tissue disease manifestations - Diagnosed with lupus around 2023 - Symptoms include swelling and knots on hands, and joint pain - Current medications: Benlysta , mycophenolate, Plaquenil, prednisone (tapered from 20 mg to 10 mg over the past year).  She follows with [name]  Cardiometabolic comorbidities - Diabetes mellitus - Hypertension   Relevant Pulmonary history: Pets: Has a dog at home Occupation:Works as a Arts administrator in a rehabilitation setting Exposures: Normal, heart, Jacuzzi.  No fair pillows or comforters No h/o chemo/XRT/amiodarone/macrodantin/MTX  No exposure to asbestos, silica or other organic allergens  Smoking history:Continues to smoke, with a 20-year history of tobacco use Travel history: No significant travel history Family history: No family history of lung disease   Outpatient Encounter  Medications as of 12/18/2023  Medication Sig   amLODipine (NORVASC) 2.5 MG tablet Take 2.5 mg by mouth daily.   aspirin EC 81 MG tablet Take 81 mg by mouth daily. Swallow whole.   Belimumab  (BENLYSTA  IV) Inject into the vein.   HYDROcodone -acetaminophen  (NORCO/VICODIN) 5-325 MG tablet Take 1 tablet by mouth every 6 (six) hours as needed.   hydroxychloroquine (PLAQUENIL) 200 MG tablet Take 200 mg by mouth 2 (two) times daily.   insulin  lispro (HUMALOG) 100 UNIT/ML injection Inject 0-100 Units into the skin See admin instructions. INJECT 100 UNITS SUBCUTANEOUSLY PER DAY MAX VIA PUMP   ketorolac  (TORADOL ) 10 MG tablet Take 1 tablet (10 mg total) by mouth every 8 (eight) hours as needed.   Lancets (ACCU-CHEK MULTICLIX) lancets Use as instructed   lisinopril  (ZESTRIL ) 20 MG tablet Take 20 mg by mouth daily.   LORazepam  (ATIVAN ) 0.5 MG tablet Take 0.5 mg by mouth 2 (two) times daily.   mycophenolate (CELLCEPT) 500 MG tablet Take 1,500 mg by mouth 2 (two) times daily.   ondansetron  (ZOFRAN -ODT) 8 MG disintegrating tablet Take 1 tablet (8 mg total) by mouth every 8 (eight) hours as needed for nausea or vomiting.   predniSONE (DELTASONE) 10 MG tablet Take 15 mg by mouth daily with breakfast.   pregabalin (LYRICA) 150 MG capsule Take 150 mg by mouth 3 (three) times daily.   rosuvastatin (CRESTOR) 10 MG tablet Take 10 mg by mouth daily.   sertraline  (ZOLOFT ) 100 MG tablet Take 200 mg by mouth daily.   medroxyPROGESTERone  (PROVERA ) 10 MG tablet Take 1 tablet (10 mg total) by mouth daily. (Patient not taking: Reported on 12/18/2023)   ondansetron  (  ZOFRAN ) 4 MG tablet Take 1 tablet (4 mg total) by mouth every 6 (six) hours as needed for nausea. (Patient not taking: Reported on 12/18/2023)   pantoprazole  (PROTONIX ) 40 MG tablet Take 1 tablet (40 mg total) by mouth daily. (Patient not taking: Reported on 12/18/2023)   promethazine  (PHENERGAN ) 25 MG tablet Take 1 tablet (25 mg total) by mouth every 6 (six) hours as  needed for nausea or vomiting. (Patient not taking: Reported on 12/18/2023)   No facility-administered encounter medications on file as of 12/18/2023.     Physical Exam: Today's Vitals   12/18/23 0914  BP: (!) 166/84  Pulse: 86  Temp: 97.8 F (36.6 C)  TempSrc: Temporal  SpO2: 100%  Weight: 166 lb 6.4 oz (75.5 kg)  Height: 5' 3 (1.6 m)   Body mass index is 29.48 kg/m.  Physical Exam GEN: No acute distress. CV: Regular rate and rhythm, no murmurs. LUNGS: Clear to auscultation bilaterally, normal respiratory effort. SKIN JOINTS: Warm and dry, no rash.    Data Reviewed: Imaging: High resolution CT 06/17/2023-mild patchy air trapping, nonspecific ground glass opacities in both lungs.  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  Trace pericardial effusion I have reviewed the images personally.  PFTs:  Labs:  Assessment and Plan Assessment & Plan Evaluation for ILD Mild air trapping and ground glass changes on chest CT Mild air trapping and ground glass changes on chest CT. Differential diagnosis includes smoking-related changes such as COPD and lupus-related interstitial lung disease though the changes appear very mild and nonspecific. Current medications, including mycophenolate, may help control potential interstitial lung disease. No current respiratory symptoms warranting immediate intervention. - Order lung function test in three months to assess lung function and determine if there is any obstruction from smoking or other causes.  Tobacco use disorder Chronic tobacco use for 20 years. Expresses interest in quitting smoking. Discussed the benefits of quitting smoking, including potential improvement in lung health and reduction in risk of chronic bronchitis and COPD. Recommended combination therapy with nicotine  patches and Chantix  to aid in smoking cessation. Explained the process of setting a quit date and starting medication one week prior to quit date to reduce the urge to smoke.  Time spent  counseling-5 minutes.  Reassess at return visit - Prescribe nicotine  patches and Chantix  - Advise setting a quit date and starting medication one week prior to quit date. - Send prescriptions to [facility] in [place].  Systemic lupus erythematosus Systemic lupus erythematosus diagnosed a few years ago. Currently managed with Benlysta , mycophenolate, Plaquenil, and prednisone. Lupus appears to be under good control with current medications. Plan to taper prednisone is in place.  Recommendations: High resolution CT, PFTs Smoking cessation with nicotine  patches, Chantix   Lonna Coder MD [facility] 12/18/2023, 9:44 AM  CC: Kristin Miquel [name], MD

## 2023-12-18 NOTE — Patient Instructions (Signed)
  VISIT SUMMARY: Today, you had a follow-up appointment to discuss your recent CT scan and overall pulmonary health. We reviewed your current symptoms, medical history, and lifestyle factors, including your lupus diagnosis and tobacco use. We also discussed your current medications and their effectiveness in managing your conditions.  YOUR PLAN: -MILD AIR TRAPPING AND GROUND GLASS CHANGES ON CHEST CT: Your CT scan showed mild air trapping and ground glass changes, which could be related to smoking or lupus. We will monitor your lung function with a test in three months to see if there are any issues that need to be addressed.  -TOBACCO USE DISORDER: You have been smoking for 20 years and are interested in quitting. Quitting smoking can improve your lung health and reduce the risk of chronic bronchitis and COPD. We recommend using nicotine  patches and Chatnix to help you quit. Set a quit date and start the medication one week before that date. [facility] [place].  -SYSTEMIC LUPUS ERYTHEMATOSUS: Lupus is an autoimmune disease that can cause swelling, pain, and other symptoms. Your lupus is currently well-managed with your medications, including Benlysta , mycophenolate, Plaquenil, and prednisone. We will continue with the current plan to taper your prednisone.  INSTRUCTIONS: Please schedule a lung function test in three months to monitor your lung health. Set a quit date for smoking and start your prescribed medications one week before that date. Follow up with your primary care provider to continue managing your lupus and other health conditions.  Contains text generated by [employer].

## 2023-12-25 DIAGNOSIS — M329 Systemic lupus erythematosus, unspecified: Secondary | ICD-10-CM | POA: Diagnosis not present

## 2024-01-03 DIAGNOSIS — B9689 Other specified bacterial agents as the cause of diseases classified elsewhere: Secondary | ICD-10-CM | POA: Diagnosis not present

## 2024-01-03 DIAGNOSIS — J019 Acute sinusitis, unspecified: Secondary | ICD-10-CM | POA: Diagnosis not present

## 2024-01-22 DIAGNOSIS — R229 Localized swelling, mass and lump, unspecified: Secondary | ICD-10-CM | POA: Diagnosis not present

## 2024-01-22 DIAGNOSIS — M329 Systemic lupus erythematosus, unspecified: Secondary | ICD-10-CM | POA: Diagnosis not present

## 2024-01-22 DIAGNOSIS — M1990 Unspecified osteoarthritis, unspecified site: Secondary | ICD-10-CM | POA: Diagnosis not present

## 2024-01-22 DIAGNOSIS — R21 Rash and other nonspecific skin eruption: Secondary | ICD-10-CM | POA: Diagnosis not present

## 2024-02-08 ENCOUNTER — Encounter (HOSPITAL_COMMUNITY): Payer: Self-pay

## 2024-02-08 ENCOUNTER — Emergency Department (HOSPITAL_COMMUNITY)

## 2024-02-08 ENCOUNTER — Other Ambulatory Visit: Payer: Self-pay

## 2024-02-08 ENCOUNTER — Emergency Department (HOSPITAL_COMMUNITY)
Admission: EM | Admit: 2024-02-08 | Discharge: 2024-02-08 | Disposition: A | Discharge status: Home / Self Care | Attending: Emergency Medicine | Admitting: Emergency Medicine

## 2024-02-08 DIAGNOSIS — M545 Low back pain, unspecified: Secondary | ICD-10-CM | POA: Diagnosis not present

## 2024-02-08 DIAGNOSIS — K529 Noninfective gastroenteritis and colitis, unspecified: Secondary | ICD-10-CM

## 2024-02-08 DIAGNOSIS — Z794 Long term (current) use of insulin: Secondary | ICD-10-CM | POA: Insufficient documentation

## 2024-02-08 DIAGNOSIS — Z7982 Long term (current) use of aspirin: Secondary | ICD-10-CM | POA: Insufficient documentation

## 2024-02-08 DIAGNOSIS — M549 Dorsalgia, unspecified: Secondary | ICD-10-CM | POA: Diagnosis present

## 2024-02-08 LAB — URINALYSIS, ROUTINE W REFLEX MICROSCOPIC
Bilirubin Urine: NEGATIVE
Glucose, UA: 150 mg/dL — AB
Hgb urine dipstick: NEGATIVE
Ketones, ur: NEGATIVE mg/dL
Leukocytes,Ua: NEGATIVE
Nitrite: NEGATIVE
Protein, ur: 100 mg/dL — AB
Specific Gravity, Urine: 1.021 (ref 1.005–1.030)
pH: 5 (ref 5.0–8.0)

## 2024-02-08 LAB — CBC WITH DIFFERENTIAL/PLATELET
Abs Immature Granulocytes: 0.07 K/uL (ref 0.00–0.07)
Basophils Absolute: 0 K/uL (ref 0.0–0.1)
Basophils Relative: 0 %
Eosinophils Absolute: 0 K/uL (ref 0.0–0.5)
Eosinophils Relative: 0 %
HCT: 43.2 % (ref 36.0–46.0)
Hemoglobin: 13.9 g/dL (ref 12.0–15.0)
Immature Granulocytes: 1 %
Lymphocytes Relative: 23 %
Lymphs Abs: 2.1 K/uL (ref 0.7–4.0)
MCH: 27.9 pg (ref 26.0–34.0)
MCHC: 32.2 g/dL (ref 30.0–36.0)
MCV: 86.7 fL (ref 80.0–100.0)
Monocytes Absolute: 0.6 K/uL (ref 0.1–1.0)
Monocytes Relative: 7 %
Neutro Abs: 6.1 K/uL (ref 1.7–7.7)
Neutrophils Relative %: 69 %
Platelets: 184 K/uL (ref 150–400)
RBC: 4.98 MIL/uL (ref 3.87–5.11)
RDW: 14.7 % (ref 11.5–15.5)
WBC: 8.9 K/uL (ref 4.0–10.5)
nRBC: 0 % (ref 0.0–0.2)

## 2024-02-08 LAB — BASIC METABOLIC PANEL WITH GFR
Anion gap: 14 (ref 5–15)
BUN: 30 mg/dL — ABNORMAL HIGH (ref 6–20)
CO2: 16 mmol/L — ABNORMAL LOW (ref 22–32)
Calcium: 8.8 mg/dL — ABNORMAL LOW (ref 8.9–10.3)
Chloride: 106 mmol/L (ref 98–111)
Creatinine, Ser: 1.27 mg/dL — ABNORMAL HIGH (ref 0.44–1.00)
GFR, Estimated: 53 mL/min — ABNORMAL LOW (ref >60)
Glucose, Bld: 242 mg/dL — ABNORMAL HIGH (ref 70–99)
Potassium: 4.7 mmol/L (ref 3.5–5.1)
Sodium: 136 mmol/L (ref 135–145)

## 2024-02-08 LAB — PREGNANCY, URINE: Preg Test, Ur: NEGATIVE

## 2024-02-08 LAB — POC URINE PREG, ED: Preg Test, Ur: NEGATIVE

## 2024-02-08 MED ORDER — SODIUM CHLORIDE 0.9 % IV BOLUS
500.0000 mL | Freq: Once | INTRAVENOUS | Status: AC
  Administered 2024-02-08: 500 mL via INTRAVENOUS

## 2024-02-08 MED ORDER — FENTANYL CITRATE (PF) 100 MCG/2ML IJ SOLN
50.0000 ug | Freq: Once | INTRAMUSCULAR | Status: AC
  Administered 2024-02-08: 50 ug via INTRAVENOUS
  Filled 2024-02-08: qty 2

## 2024-02-08 MED ORDER — OXYCODONE-ACETAMINOPHEN 5-325 MG PO TABS
1.0000 | ORAL_TABLET | Freq: Once | ORAL | Status: AC
  Administered 2024-02-08: 1 via ORAL
  Filled 2024-02-08: qty 1

## 2024-02-08 MED ORDER — ONDANSETRON 8 MG PO TBDP: 8.0000 mg | ORAL_TABLET | Freq: Three times a day (TID) | ORAL | 0 refills | Status: AC | PRN

## 2024-02-08 MED ORDER — ONDANSETRON HCL 4 MG/2ML IJ SOLN
4.0000 mg | Freq: Once | INTRAMUSCULAR | Status: AC
  Administered 2024-02-08: 4 mg via INTRAVENOUS
  Filled 2024-02-08: qty 2

## 2024-02-08 MED ORDER — OXYCODONE-ACETAMINOPHEN 5-325 MG PO TABS: 1.0000 | ORAL_TABLET | Freq: Four times a day (QID) | ORAL | 0 refills | Status: DC | PRN

## 2024-02-08 MED ORDER — AMOXICILLIN-POT CLAVULANATE 875-125 MG PO TABS: 1.0000 | ORAL_TABLET | Freq: Two times a day (BID) | ORAL | 0 refills | Status: DC

## 2024-02-08 MED ORDER — AMOXICILLIN-POT CLAVULANATE 875-125 MG PO TABS
1.0000 | ORAL_TABLET | Freq: Once | ORAL | Status: AC
  Administered 2024-02-08: 1 via ORAL
  Filled 2024-02-08: qty 1

## 2024-02-08 NOTE — Discharge Instructions (Signed)
 You were seen in the emergency department for left-sided low back pain and nausea.  You had blood work urinalysis and a CAT scan that did not show any evidence of kidney infection or kidney stone.  They did show some inflammation in the colon so we are starting you on some antibiotics.  We are also sending prescriptions for pain medicine and nausea medication.  Please keep well-hydrated and monitor your blood sugars.  Follow-up with your regular doctor.  Return if any worsening or concerning symptoms

## 2024-02-08 NOTE — ED Provider Notes (Signed)
 [place] EMERGENCY DEPARTMENT AT [facility] Provider Note   CSN: [id number] Arrival date & time: 02/08/24  1011     Patient presents with: Back Pain (Left side)   [name] is a 43 y.o. female.  She is here with a complaint of left flank and back pain radiating to her left side of her abdomen and that started yesterday.  Worse with movement.  Rates it as 9 out of 10.  Denies prior history of same.  Has also had some loose stools.  No fevers chills.  No dysuria or hematuria.  No incontinence.  No numbness or weakness focally in her legs but does say her legs just feel generally weak.  Tried some ibuprofen without improvement.   The history is provided by the patient.  Back Pain Location:  Lumbar spine Quality:  Aching Pain severity:  Severe Onset quality:  Gradual Duration:  2 days Timing:  Constant Progression:  Unchanged Chronicity:  New Context: not recent injury   Relieved by:  Nothing Worsened by:  Ambulation and bending Ineffective treatments:  NSAIDs Associated symptoms: abdominal pain   Associated symptoms: no bladder incontinence, no bowel incontinence, no chest pain, no dysuria, no fever, no leg pain, no numbness, no tingling and no weakness        Prior to Admission medications   Medication Sig Start Date End Date Taking? Authorizing Provider  amLODipine (NORVASC) 2.5 MG tablet Take 2.5 mg by mouth daily. 08/18/23   [provider]  aspirin EC 81 MG tablet Take 81 mg by mouth daily. Swallow whole.    [provider]  Belimumab  (BENLYSTA  IV) Inject into the vein.    [provider]  HYDROcodone -acetaminophen  (NORCO/VICODIN) 5-325 MG tablet Take 1 tablet by mouth every 6 (six) hours as needed. 09/26/23   Jayne Vonn DEL, MD  hydroxychloroquine (PLAQUENIL) 200 MG tablet Take 200 mg by mouth 2 (two) times daily.    [provider]  insulin  lispro (HUMALOG) 100 UNIT/ML injection Inject 0-100 Units into the skin See admin  instructions. INJECT 100 UNITS SUBCUTANEOUSLY PER DAY MAX VIA PUMP    [provider]  ketorolac  (TORADOL ) 10 MG tablet Take 1 tablet (10 mg total) by mouth every 8 (eight) hours as needed. 09/26/23   Jayne Vonn DEL, MD  Lancets (ACCU-CHEK MULTICLIX) lancets Use as instructed 08/01/14   Dhungel, Nishant, MD  lisinopril  (ZESTRIL ) 20 MG tablet Take 20 mg by mouth daily.    [provider]  LORazepam  (ATIVAN ) 0.5 MG tablet Take 0.5 mg by mouth 2 (two) times daily.    [provider]  medroxyPROGESTERone  (PROVERA ) 10 MG tablet Take 1 tablet (10 mg total) by mouth daily. Patient not taking: Reported on 12/18/2023 08/03/23   Jayne Vonn DEL, MD  mycophenolate (CELLCEPT) 500 MG tablet Take 1,500 mg by mouth 2 (two) times daily.    [provider]  nicotine  (NICODERM CQ  - DOSED IN MG/24 HOURS) 14 mg/24hr patch Place 1 patch (14 mg total) onto the skin daily. 12/18/23   Mannam, Praveen, MD  ondansetron  (ZOFRAN ) 4 MG tablet Take 1 tablet (4 mg total) by mouth every 6 (six) hours as needed for nausea. Patient not taking: Reported on 12/18/2023 05/14/17   Cheryle Page, MD  ondansetron  (ZOFRAN -ODT) 8 MG disintegrating tablet Take 1 tablet (8 mg total) by mouth every 8 (eight) hours as needed for nausea or vomiting. 09/26/23   Jayne Vonn DEL, MD  pantoprazole  (PROTONIX ) 40 MG tablet Take  1 tablet (40 mg total) by mouth daily. Patient not taking: Reported on 12/18/2023 05/14/17   Cheryle Page, MD  predniSONE (DELTASONE) 10 MG tablet Take 15 mg by mouth daily with breakfast.    [provider]  pregabalin (LYRICA) 150 MG capsule Take 150 mg by mouth 3 (three) times daily.    [provider]  promethazine  (PHENERGAN ) 25 MG tablet Take 1 tablet (25 mg total) by mouth every 6 (six) hours as needed for nausea or vomiting. Patient not taking: Reported on 12/18/2023 04/24/18   Norris Will PARAS, PA-C  rosuvastatin (CRESTOR) 10 MG tablet Take 10 mg by mouth daily.    [provider]  sertraline  (ZOLOFT ) 100 MG tablet Take 200 mg by mouth daily.    [provider]  Varenicline  Tartrate, Starter, (CHANTIX  STARTING MONTH PAK) 0.5 MG X 11 & 1 MG X 42 TBPK Take 1 tablet by mouth daily. 12/18/23   Mannam, Praveen, MD    Allergies: Strawberry extract    Review of Systems  Constitutional:  Negative for fever.  Cardiovascular:  Negative for chest pain.  Gastrointestinal:  Positive for abdominal pain. Negative for bowel incontinence.  Genitourinary:  Negative for bladder incontinence and dysuria.  Musculoskeletal:  Positive for back pain.  Neurological:  Negative for tingling, weakness and numbness.    Updated Vital Signs BP 122/74 (BP Location: Left Arm)   Pulse 70   Temp 98.1 F (36.7 C) (Oral)   Resp 17   Ht 5' 3 (1.6 m)   Wt 74.8 kg   LMP 10/26/2022   SpO2 100%   BMI 29.23 kg/m   Physical Exam Vitals and nursing note reviewed.  Constitutional:      General: She is not in acute distress.    Appearance: Normal appearance. She is well-developed.  HENT:     Head: Normocephalic and atraumatic.  Eyes:     Conjunctiva/sclera: Conjunctivae normal.  Cardiovascular:     Rate and Rhythm: Normal rate and regular rhythm.     Heart sounds: No murmur heard. Pulmonary:     Effort: Pulmonary effort is normal. No respiratory distress.     Breath sounds: Normal breath sounds. No stridor. No wheezing.  Abdominal:     Palpations: Abdomen is soft.     Tenderness: There is no abdominal tenderness. There is no guarding or rebound.  Musculoskeletal:        General: Tenderness present. No deformity.     Cervical back: Neck supple.     Comments: Left flank and left paralumbar area tender  Skin:    General: Skin is warm and dry.  Neurological:     General: No focal deficit present.     Mental Status: She is alert.     GCS: GCS eye subscore is 4. GCS verbal subscore is 5. GCS motor subscore is 6.     (all labs ordered are listed, but only  abnormal results are displayed) Labs Reviewed  URINALYSIS, ROUTINE W REFLEX MICROSCOPIC - Abnormal; Notable for the following components:      Result Value   APPearance HAZY (*)    Glucose, UA 150 (*)    Protein, ur 100 (*)    Bacteria, UA RARE (*)    All other components within normal limits  BASIC METABOLIC PANEL WITH GFR - Abnormal; Notable for the following components:   CO2 16 (*)    Glucose, Bld 242 (*)    BUN 30 (*)    Creatinine, Ser 1.27 (*)  Calcium  8.8 (*)    GFR, Estimated 53 (*)    All other components within normal limits  PREGNANCY, URINE  CBC WITH DIFFERENTIAL/PLATELET  CBC WITH DIFFERENTIAL/PLATELET  POC URINE PREG, ED    EKG: None  Radiology: CT Renal Stone Study Result Date: 02/08/2024 CLINICAL DATA:  Flank pain. EXAM: CT ABDOMEN AND PELVIS WITHOUT CONTRAST TECHNIQUE: Multidetector CT imaging of the abdomen and pelvis was performed following the standard protocol without IV contrast. RADIATION DOSE REDUCTION: This exam was performed according to the departmental dose-optimization program which includes automated exposure control, adjustment of the mA and/or kV according to patient size and/or use of iterative reconstruction technique. COMPARISON:  May 12, 2017.  June 07, 2023. FINDINGS: Lower chest: No acute abnormality. Hepatobiliary: No focal liver abnormality is seen. No gallstones, gallbladder wall thickening, or biliary dilatation. Pancreas: Unremarkable. No pancreatic ductal dilatation or surrounding inflammatory changes. Spleen: Stable probable calcifications seen in superior portion of spleen. No acute abnormality is noted. Adrenals/Urinary Tract: Adrenal glands are unremarkable. Kidneys are normal, without renal calculi, focal lesion, or hydronephrosis. Bladder is unremarkable. Stomach/Bowel: Stomach and appendix are unremarkable. There is no evidence of bowel obstruction. Mild to moderate wall thickening of descending and sigmoid colon is noted which  may be due to lack of distension, but infectious or inflammatory colitis cannot be excluded. Vascular/Lymphatic: Aortic atherosclerosis. No enlarged abdominal or pelvic lymph nodes. Reproductive: Uterus and bilateral adnexa are unremarkable. Other: No abdominal wall hernia or abnormality. No abdominopelvic ascites. Musculoskeletal: No acute or significant osseous findings. IMPRESSION: 1. Mild to moderate wall thickening of descending and sigmoid colon is noted which may be due to lack of distension, but infectious or inflammatory colitis cannot be excluded. 2. Aortic atherosclerosis. Aortic Atherosclerosis (ICD10-I70.0). Electronically Signed   By: Lynwood Landy Raddle M.D.   On: 02/08/2024 14:09     Procedures   Medications Ordered in the ED  fentaNYL  (SUBLIMAZE ) injection 50 mcg (50 mcg Intravenous Given 02/08/24 1152)  fentaNYL  (SUBLIMAZE ) injection 50 mcg (50 mcg Intravenous Given 02/08/24 1319)  sodium chloride  0.9 % bolus 500 mL (0 mLs Intravenous Stopped 02/08/24 1538)  oxyCODONE-acetaminophen  (PERCOCET/ROXICET) 5-325 MG per tablet 1 tablet (1 tablet Oral Given 02/08/24 1509)  amoxicillin-clavulanate (AUGMENTIN) 875-125 MG per tablet 1 tablet (1 tablet Oral Given 02/08/24 1509)  ondansetron  (ZOFRAN ) injection 4 mg (4 mg Intravenous Given 02/08/24 1509)                                    Medical Decision Making Amount and/or Complexity of Data Reviewed Labs: ordered. Radiology: ordered.  Risk Prescription drug management.   This patient complains of left-sided back pain nausea; this involves an extensive number of treatment Options and is a complaint that carries with it a high risk of complications and morbidity. The differential includes renal colic, pyelonephritis, musculoskeletal pain, radiculopathy  I ordered, reviewed and interpreted labs, which included CBC normal, chemistries with low bicarb elevated glucose normal gap, urinalysis without clear signs of infection, pregnancy  negative I ordered medication IV fluids, IV and oral pain medicine nausea medication antibiotics and reviewed PMP when indicated. I ordered imaging studies which included CT renal and I independently    visualized and interpreted imaging which showed no evidence of obstructive uropathy, does have some descending colonic thickening question colitis Additional history obtained from patient significant other Previous records obtained and reviewed in epic including prior rheumatology notes Cardiac monitoring  reviewed, sinus rhythm Social determinants considered, tobacco use physically inactive Critical Interventions: None  After the interventions stated above, I reevaluated the patient and found patient's pain to be improved and tolerating p.o. Admission and further testing considered, no indications for admission at this time.  Will cover with antibiotics and recommend close follow-up with her treatment team.  Return instructions discussed.      Final diagnoses:  Acute left-sided low back pain without sciatica  Colitis    ED Discharge Orders          Ordered    oxyCODONE-acetaminophen  (PERCOCET/ROXICET) 5-325 MG tablet  Every 6 hours PRN        02/08/24 1442    amoxicillin-clavulanate (AUGMENTIN) 875-125 MG tablet  Every 12 hours        02/08/24 1442    ondansetron  (ZOFRAN -ODT) 8 MG disintegrating tablet  Every 8 hours PRN        02/08/24 1442               Thorne Wirz C, MD 02/08/24 1654

## 2024-02-22 ENCOUNTER — Inpatient Hospital Stay (HOSPITAL_COMMUNITY)
Admission: EM | Admit: 2024-02-22 | Discharge: 2024-03-02 | DRG: 871 | Disposition: A | Discharge status: Home / Self Care | Attending: Family Medicine | Admitting: Family Medicine

## 2024-02-22 ENCOUNTER — Emergency Department (HOSPITAL_COMMUNITY)

## 2024-02-22 ENCOUNTER — Other Ambulatory Visit: Payer: Self-pay

## 2024-02-22 ENCOUNTER — Encounter (HOSPITAL_COMMUNITY): Payer: Self-pay | Admitting: Emergency Medicine

## 2024-02-22 DIAGNOSIS — A419 Sepsis, unspecified organism: Secondary | ICD-10-CM | POA: Diagnosis not present

## 2024-02-22 DIAGNOSIS — J129 Viral pneumonia, unspecified: Secondary | ICD-10-CM | POA: Diagnosis present

## 2024-02-22 DIAGNOSIS — I10 Essential (primary) hypertension: Secondary | ICD-10-CM

## 2024-02-22 DIAGNOSIS — Z833 Family history of diabetes mellitus: Secondary | ICD-10-CM

## 2024-02-22 DIAGNOSIS — Z9102 Food additives allergy status: Secondary | ICD-10-CM

## 2024-02-22 DIAGNOSIS — J440 Chronic obstructive pulmonary disease with acute lower respiratory infection: Secondary | ICD-10-CM | POA: Diagnosis present

## 2024-02-22 DIAGNOSIS — Z8249 Family history of ischemic heart disease and other diseases of the circulatory system: Secondary | ICD-10-CM

## 2024-02-22 DIAGNOSIS — Z7982 Long term (current) use of aspirin: Secondary | ICD-10-CM

## 2024-02-22 DIAGNOSIS — E663 Overweight: Secondary | ICD-10-CM | POA: Diagnosis present

## 2024-02-22 DIAGNOSIS — Z7960 Long term (current) use of unspecified immunomodulators and immunosuppressants: Secondary | ICD-10-CM

## 2024-02-22 DIAGNOSIS — Z79899 Other long term (current) drug therapy: Secondary | ICD-10-CM

## 2024-02-22 DIAGNOSIS — I129 Hypertensive chronic kidney disease with stage 1 through stage 4 chronic kidney disease, or unspecified chronic kidney disease: Secondary | ICD-10-CM | POA: Diagnosis present

## 2024-02-22 DIAGNOSIS — D631 Anemia in chronic kidney disease: Secondary | ICD-10-CM | POA: Diagnosis present

## 2024-02-22 DIAGNOSIS — Z9641 Presence of insulin pump (external) (internal): Secondary | ICD-10-CM | POA: Diagnosis present

## 2024-02-22 DIAGNOSIS — J441 Chronic obstructive pulmonary disease with (acute) exacerbation: Secondary | ICD-10-CM | POA: Diagnosis present

## 2024-02-22 DIAGNOSIS — B9789 Other viral agents as the cause of diseases classified elsewhere: Secondary | ICD-10-CM | POA: Diagnosis present

## 2024-02-22 DIAGNOSIS — Z7952 Long term (current) use of systemic steroids: Secondary | ICD-10-CM

## 2024-02-22 DIAGNOSIS — Z794 Long term (current) use of insulin: Secondary | ICD-10-CM

## 2024-02-22 DIAGNOSIS — J9601 Acute respiratory failure with hypoxia: Secondary | ICD-10-CM | POA: Diagnosis present

## 2024-02-22 DIAGNOSIS — E1022 Type 1 diabetes mellitus with diabetic chronic kidney disease: Secondary | ICD-10-CM | POA: Diagnosis present

## 2024-02-22 DIAGNOSIS — T380X5A Adverse effect of glucocorticoids and synthetic analogues, initial encounter: Secondary | ICD-10-CM | POA: Diagnosis present

## 2024-02-22 DIAGNOSIS — Z6829 Body mass index (BMI) 29.0-29.9, adult: Secondary | ICD-10-CM

## 2024-02-22 DIAGNOSIS — E1010 Type 1 diabetes mellitus with ketoacidosis without coma: Secondary | ICD-10-CM | POA: Diagnosis present

## 2024-02-22 DIAGNOSIS — Z825 Family history of asthma and other chronic lower respiratory diseases: Secondary | ICD-10-CM

## 2024-02-22 DIAGNOSIS — J188 Other pneumonia, unspecified organism: Principal | ICD-10-CM | POA: Diagnosis present

## 2024-02-22 DIAGNOSIS — N179 Acute kidney failure, unspecified: Secondary | ICD-10-CM | POA: Diagnosis present

## 2024-02-22 DIAGNOSIS — J189 Pneumonia, unspecified organism: Secondary | ICD-10-CM | POA: Diagnosis present

## 2024-02-22 DIAGNOSIS — F17210 Nicotine dependence, cigarettes, uncomplicated: Secondary | ICD-10-CM | POA: Diagnosis present

## 2024-02-22 DIAGNOSIS — M329 Systemic lupus erythematosus, unspecified: Secondary | ICD-10-CM | POA: Diagnosis present

## 2024-02-22 DIAGNOSIS — N1831 Chronic kidney disease, stage 3a: Secondary | ICD-10-CM | POA: Diagnosis present

## 2024-02-22 LAB — CBC
HCT: 35.6 % — ABNORMAL LOW (ref 36.0–46.0)
Hemoglobin: 11.1 g/dL — ABNORMAL LOW (ref 12.0–15.0)
MCH: 27.6 pg (ref 26.0–34.0)
MCHC: 31.2 g/dL (ref 30.0–36.0)
MCV: 88.6 fL (ref 80.0–100.0)
Platelets: 306 K/uL (ref 150–400)
RBC: 4.02 MIL/uL (ref 3.87–5.11)
RDW: 14.5 % (ref 11.5–15.5)
WBC: 18.9 K/uL — ABNORMAL HIGH (ref 4.0–10.5)
nRBC: 0 % (ref 0.0–0.2)

## 2024-02-22 MED ORDER — FENTANYL CITRATE (PF) 100 MCG/2ML IJ SOLN
50.0000 ug | Freq: Once | INTRAMUSCULAR | Status: AC
  Administered 2024-02-22: 50 ug via INTRAVENOUS
  Filled 2024-02-22: qty 2

## 2024-02-22 NOTE — ED Provider Notes (Signed)
 [place] EMERGENCY DEPARTMENT AT [facility]  Provider Note  CSN: [id number] Arrival date & time: 02/22/24 2303  History Chief Complaint  Patient presents with   Shortness of Breath    [name] is a 43 y.o. female with history of, DM on insulin  pump and SLE on immunosuppressants brought to the ED via EMS from local fire station. She reports a few days of productive cough, increasing SOB this evening. Tried to drive here but had to stop at [facility] where she was reportedly hypoxic in the low 80s on room air. Given nebs, solumedrol and IVF and started on supplemental oxygen with some improvement.    Home Medications Prior to Admission medications   Medication Sig Start Date End Date Taking? Authorizing Provider  amLODipine (NORVASC) 2.5 MG tablet Take 2.5 mg by mouth daily. 08/18/23   [provider]  amoxicillin-clavulanate (AUGMENTIN) 875-125 MG tablet Take 1 tablet by mouth every 12 (twelve) hours. 02/08/24   Towana Ozell BROCKS, MD  aspirin EC 81 MG tablet Take 81 mg by mouth daily. Swallow whole.    [provider]  Belimumab  (BENLYSTA  IV) Inject into the vein.    [provider]  HYDROcodone -acetaminophen  (NORCO/VICODIN) 5-325 MG tablet Take 1 tablet by mouth every 6 (six) hours as needed. 09/26/23   Jayne Vonn DEL, MD  hydroxychloroquine (PLAQUENIL) 200 MG tablet Take 200 mg by mouth 2 (two) times daily.    [provider]  insulin  lispro (HUMALOG) 100 UNIT/ML injection Inject 0-100 Units into the skin See admin instructions. INJECT 100 UNITS SUBCUTANEOUSLY PER DAY MAX VIA PUMP    [provider]  ketorolac  (TORADOL ) 10 MG tablet Take 1 tablet (10 mg total) by mouth every 8 (eight) hours as needed. 09/26/23   Jayne Vonn DEL, MD  Lancets (ACCU-CHEK MULTICLIX) lancets Use as instructed 08/01/14   Dhungel, Nishant, MD  lisinopril  (ZESTRIL ) 20 MG tablet Take 20 mg by mouth daily.    [provider]  LORazepam  (ATIVAN )  0.5 MG tablet Take 0.5 mg by mouth 2 (two) times daily.    [provider]  medroxyPROGESTERone  (PROVERA ) 10 MG tablet Take 1 tablet (10 mg total) by mouth daily. Patient not taking: Reported on 12/18/2023 08/03/23   Jayne Vonn DEL, MD  mycophenolate (CELLCEPT) 500 MG tablet Take 1,500 mg by mouth 2 (two) times daily.    [provider]  nicotine  (NICODERM CQ  - DOSED IN MG/24 HOURS) 14 mg/24hr patch Place 1 patch (14 mg total) onto the skin daily. 12/18/23   Mannam, Praveen, MD  ondansetron  (ZOFRAN ) 4 MG tablet Take 1 tablet (4 mg total) by mouth every 6 (six) hours as needed for nausea. Patient not taking: Reported on 12/18/2023 05/14/17   Cheryle Page, MD  ondansetron  (ZOFRAN -ODT) 8 MG disintegrating tablet Take 1 tablet (8 mg total) by mouth every 8 (eight) hours as needed for nausea or vomiting. 02/08/24   Towana Ozell BROCKS, MD  oxyCODONE-acetaminophen  (PERCOCET/ROXICET) 5-325 MG tablet Take 1 tablet by mouth every 6 (six) hours as needed for severe pain (pain score 7-10). 02/08/24   Towana Ozell BROCKS, MD  pantoprazole  (PROTONIX ) 40 MG tablet Take 1 tablet (40 mg total) by mouth daily. Patient not taking: Reported on 12/18/2023 05/14/17   Cheryle Page, MD  predniSONE (DELTASONE) 10 MG tablet Take 15 mg by mouth daily with breakfast.    [provider]  pregabalin (LYRICA) 150 MG capsule Take 150 mg by mouth 3 (three) times daily.  [provider]  promethazine  (PHENERGAN ) 25 MG tablet Take 1 tablet (25 mg total) by mouth every 6 (six) hours as needed for nausea or vomiting. Patient not taking: Reported on 12/18/2023 04/24/18   Norris Will PARAS, PA-C  rosuvastatin (CRESTOR) 10 MG tablet Take 10 mg by mouth daily.    [provider]  sertraline  (ZOLOFT ) 100 MG tablet Take 200 mg by mouth daily.    [provider]  Varenicline  Tartrate, Starter, (CHANTIX  STARTING MONTH PAK) 0.5 MG X 11 & 1 MG X 42 TBPK Take 1 tablet by mouth daily. 12/18/23   Mannam,  Praveen, MD     Allergies    Strawberry extract   Review of Systems   Review of Systems Please see HPI for pertinent positives and negatives  Physical Exam BP (!) 131/55   Pulse 100   Temp 98.6 F (37 C) (Oral)   Resp (!) 21   Ht 5' 3 (1.6 m)   Wt 75 kg   LMP 10/26/2022   SpO2 98%   BMI 29.29 kg/m   Physical Exam Vitals and nursing note reviewed.  Constitutional:      Appearance: Normal appearance.  HENT:     Head: Normocephalic and atraumatic.     Nose: Nose normal.     Mouth/Throat:     Mouth: Mucous membranes are moist.  Eyes:     Extraocular Movements: Extraocular movements intact.     Conjunctiva/sclera: Conjunctivae normal.  Cardiovascular:     Rate and Rhythm: Normal rate.  Pulmonary:     Effort: Pulmonary effort is normal. Tachypnea present.     Breath sounds: Wheezing and rales present.  Abdominal:     General: Abdomen is flat.     Palpations: Abdomen is soft.     Tenderness: There is no abdominal tenderness.  Musculoskeletal:        General: No swelling. Normal range of motion.     Cervical back: Neck supple.     Right lower leg: No edema.     Left lower leg: No edema.  Skin:    General: Skin is warm and dry.  Neurological:     General: No focal deficit present.     Mental Status: She is alert.  Psychiatric:        Mood and Affect: Mood normal.     ED Results / Procedures / Treatments   EKG EKG Interpretation Date/Time:  Friday February 22 2024 23:23:00 EDT Ventricular Rate:  98 PR Interval:  123 QRS Duration:  100 QT Interval:  348 QTC Calculation: 445 R Axis:   74  Text Interpretation: Sinus rhythm LAE, consider biatrial enlargement RSR' in V1 or V2, right VCD or RVH Since last tracing Rate faster Confirmed by Roselyn Dunnings [phone] on 02/22/2024 11:34:35 PM  Procedures Procedures  Medications Ordered in the ED Medications  fentaNYL  (SUBLIMAZE ) injection 50 mcg (has no administration in time range)    Initial Impression  and Plan  Patient here with cough and SOB, hypoxic in field improved with nebs and oxygen. She has been seen by Pulm in the past to monitor for SLE lung involvement but has not had respiratory symptoms chronically. Concern for PNA given immunosuppression, developing ILD or PE. Will check labs and send for CTA. Pain meds for comfort, having some back pain attributed to coughing.   ED Course       MDM Rules/Calculators/A&P Medical Decision Making Amount and/or Complexity of Data Reviewed Labs: ordered. Radiology: ordered.  Risk  Prescription drug management.     Final Clinical Impression(s) / ED Diagnoses Final diagnoses:  None    Rx / DC Orders ED Discharge Orders     None

## 2024-02-22 NOTE — ED Triage Notes (Signed)
 Pt with c/o cough and congestion for a few days. States got sob around bedtime tonight and tried to make it to hospital pov but had to stop at local fire department en route and call 911 from there. EMS reports O2 sats on R/A at fire department was 80% and pt had both inspiratory and expiratory wheezes throughout. Fire dept administered 5mg  Albuterol via nebulizer and then EMS gave a total of 3 duonebs, an additional 5mg  Albuterol neb, 125mg  Solumedrol IV, and 500 cc NS IV. EMS also reports cbg of 331 stating pt with insulin  pump.

## 2024-02-23 ENCOUNTER — Inpatient Hospital Stay (HOSPITAL_COMMUNITY)

## 2024-02-23 ENCOUNTER — Emergency Department (HOSPITAL_COMMUNITY)

## 2024-02-23 DIAGNOSIS — A419 Sepsis, unspecified organism: Secondary | ICD-10-CM | POA: Diagnosis present

## 2024-02-23 DIAGNOSIS — E1022 Type 1 diabetes mellitus with diabetic chronic kidney disease: Secondary | ICD-10-CM | POA: Diagnosis present

## 2024-02-23 DIAGNOSIS — T380X5A Adverse effect of glucocorticoids and synthetic analogues, initial encounter: Secondary | ICD-10-CM | POA: Diagnosis present

## 2024-02-23 DIAGNOSIS — R0609 Other forms of dyspnea: Secondary | ICD-10-CM | POA: Diagnosis not present

## 2024-02-23 DIAGNOSIS — E663 Overweight: Secondary | ICD-10-CM | POA: Diagnosis present

## 2024-02-23 DIAGNOSIS — R918 Other nonspecific abnormal finding of lung field: Secondary | ICD-10-CM | POA: Diagnosis not present

## 2024-02-23 DIAGNOSIS — D631 Anemia in chronic kidney disease: Secondary | ICD-10-CM | POA: Diagnosis present

## 2024-02-23 DIAGNOSIS — Z833 Family history of diabetes mellitus: Secondary | ICD-10-CM | POA: Diagnosis not present

## 2024-02-23 DIAGNOSIS — J129 Viral pneumonia, unspecified: Secondary | ICD-10-CM | POA: Diagnosis present

## 2024-02-23 DIAGNOSIS — J188 Other pneumonia, unspecified organism: Secondary | ICD-10-CM | POA: Diagnosis not present

## 2024-02-23 DIAGNOSIS — J189 Pneumonia, unspecified organism: Secondary | ICD-10-CM | POA: Diagnosis not present

## 2024-02-23 DIAGNOSIS — I129 Hypertensive chronic kidney disease with stage 1 through stage 4 chronic kidney disease, or unspecified chronic kidney disease: Secondary | ICD-10-CM | POA: Diagnosis present

## 2024-02-23 DIAGNOSIS — Z7982 Long term (current) use of aspirin: Secondary | ICD-10-CM | POA: Diagnosis not present

## 2024-02-23 DIAGNOSIS — M329 Systemic lupus erythematosus, unspecified: Secondary | ICD-10-CM | POA: Diagnosis present

## 2024-02-23 DIAGNOSIS — Z8249 Family history of ischemic heart disease and other diseases of the circulatory system: Secondary | ICD-10-CM | POA: Diagnosis not present

## 2024-02-23 DIAGNOSIS — F17210 Nicotine dependence, cigarettes, uncomplicated: Secondary | ICD-10-CM | POA: Diagnosis present

## 2024-02-23 DIAGNOSIS — E109 Type 1 diabetes mellitus without complications: Secondary | ICD-10-CM | POA: Diagnosis not present

## 2024-02-23 DIAGNOSIS — J9601 Acute respiratory failure with hypoxia: Secondary | ICD-10-CM | POA: Diagnosis present

## 2024-02-23 DIAGNOSIS — J441 Chronic obstructive pulmonary disease with (acute) exacerbation: Secondary | ICD-10-CM | POA: Diagnosis present

## 2024-02-23 DIAGNOSIS — N1831 Chronic kidney disease, stage 3a: Secondary | ICD-10-CM | POA: Diagnosis present

## 2024-02-23 DIAGNOSIS — Z794 Long term (current) use of insulin: Secondary | ICD-10-CM | POA: Diagnosis not present

## 2024-02-23 DIAGNOSIS — Z9641 Presence of insulin pump (external) (internal): Secondary | ICD-10-CM | POA: Diagnosis present

## 2024-02-23 DIAGNOSIS — Z7952 Long term (current) use of systemic steroids: Secondary | ICD-10-CM | POA: Diagnosis not present

## 2024-02-23 DIAGNOSIS — B9789 Other viral agents as the cause of diseases classified elsewhere: Secondary | ICD-10-CM | POA: Diagnosis present

## 2024-02-23 DIAGNOSIS — J440 Chronic obstructive pulmonary disease with acute lower respiratory infection: Secondary | ICD-10-CM | POA: Diagnosis present

## 2024-02-23 DIAGNOSIS — Z7960 Long term (current) use of unspecified immunomodulators and immunosuppressants: Secondary | ICD-10-CM | POA: Diagnosis not present

## 2024-02-23 DIAGNOSIS — E1010 Type 1 diabetes mellitus with ketoacidosis without coma: Secondary | ICD-10-CM | POA: Diagnosis present

## 2024-02-23 DIAGNOSIS — N179 Acute kidney failure, unspecified: Secondary | ICD-10-CM | POA: Diagnosis present

## 2024-02-23 DIAGNOSIS — Z825 Family history of asthma and other chronic lower respiratory diseases: Secondary | ICD-10-CM | POA: Diagnosis not present

## 2024-02-23 LAB — BASIC METABOLIC PANEL WITH GFR
Anion gap: 12 (ref 5–15)
Anion gap: 13 (ref 5–15)
Anion gap: 15 (ref 5–15)
Anion gap: 17 — ABNORMAL HIGH (ref 5–15)
Anion gap: 18 — ABNORMAL HIGH (ref 5–15)
BUN: 19 mg/dL (ref 6–20)
BUN: 20 mg/dL (ref 6–20)
BUN: 20 mg/dL (ref 6–20)
BUN: 20 mg/dL (ref 6–20)
BUN: 21 mg/dL — ABNORMAL HIGH (ref 6–20)
CO2: 13 mmol/L — ABNORMAL LOW (ref 22–32)
CO2: 15 mmol/L — ABNORMAL LOW (ref 22–32)
CO2: 16 mmol/L — ABNORMAL LOW (ref 22–32)
CO2: 19 mmol/L — ABNORMAL LOW (ref 22–32)
CO2: 20 mmol/L — ABNORMAL LOW (ref 22–32)
Calcium: 8.1 mg/dL — ABNORMAL LOW (ref 8.9–10.3)
Calcium: 8.2 mg/dL — ABNORMAL LOW (ref 8.9–10.3)
Calcium: 8.3 mg/dL — ABNORMAL LOW (ref 8.9–10.3)
Calcium: 8.5 mg/dL — ABNORMAL LOW (ref 8.9–10.3)
Calcium: 8.5 mg/dL — ABNORMAL LOW (ref 8.9–10.3)
Chloride: 100 mmol/L (ref 98–111)
Chloride: 102 mmol/L (ref 98–111)
Chloride: 105 mmol/L (ref 98–111)
Chloride: 105 mmol/L (ref 98–111)
Chloride: 99 mmol/L (ref 98–111)
Creatinine, Ser: 1.13 mg/dL — ABNORMAL HIGH (ref 0.44–1.00)
Creatinine, Ser: 1.18 mg/dL — ABNORMAL HIGH (ref 0.44–1.00)
Creatinine, Ser: 1.22 mg/dL — ABNORMAL HIGH (ref 0.44–1.00)
Creatinine, Ser: 1.36 mg/dL — ABNORMAL HIGH (ref 0.44–1.00)
Creatinine, Ser: 1.41 mg/dL — ABNORMAL HIGH (ref 0.44–1.00)
GFR, Estimated: 47 mL/min — ABNORMAL LOW (ref >60)
GFR, Estimated: 49 mL/min — ABNORMAL LOW (ref >60)
GFR, Estimated: 55 mL/min — ABNORMAL LOW (ref >60)
GFR, Estimated: 58 mL/min — ABNORMAL LOW (ref >60)
GFR, Estimated: >60 mL/min (ref >60)
Glucose, Bld: 192 mg/dL — ABNORMAL HIGH (ref 70–99)
Glucose, Bld: 203 mg/dL — ABNORMAL HIGH (ref 70–99)
Glucose, Bld: 355 mg/dL — ABNORMAL HIGH (ref 70–99)
Glucose, Bld: 359 mg/dL — ABNORMAL HIGH (ref 70–99)
Glucose, Bld: 82 mg/dL (ref 70–99)
Potassium: 3.7 mmol/L (ref 3.5–5.1)
Potassium: 3.8 mmol/L (ref 3.5–5.1)
Potassium: 3.9 mmol/L (ref 3.5–5.1)
Potassium: 4 mmol/L (ref 3.5–5.1)
Potassium: 4.3 mmol/L (ref 3.5–5.1)
Sodium: 131 mmol/L — ABNORMAL LOW (ref 135–145)
Sodium: 132 mmol/L — ABNORMAL LOW (ref 135–145)
Sodium: 132 mmol/L — ABNORMAL LOW (ref 135–145)
Sodium: 137 mmol/L (ref 135–145)
Sodium: 137 mmol/L (ref 135–145)

## 2024-02-23 LAB — GLUCOSE, CAPILLARY
Glucose-Capillary: 103 mg/dL — ABNORMAL HIGH (ref 70–99)
Glucose-Capillary: 114 mg/dL — ABNORMAL HIGH (ref 70–99)
Glucose-Capillary: 129 mg/dL — ABNORMAL HIGH (ref 70–99)
Glucose-Capillary: 149 mg/dL — ABNORMAL HIGH (ref 70–99)
Glucose-Capillary: 158 mg/dL — ABNORMAL HIGH (ref 70–99)
Glucose-Capillary: 177 mg/dL — ABNORMAL HIGH (ref 70–99)
Glucose-Capillary: 179 mg/dL — ABNORMAL HIGH (ref 70–99)
Glucose-Capillary: 182 mg/dL — ABNORMAL HIGH (ref 70–99)
Glucose-Capillary: 185 mg/dL — ABNORMAL HIGH (ref 70–99)
Glucose-Capillary: 192 mg/dL — ABNORMAL HIGH (ref 70–99)
Glucose-Capillary: 212 mg/dL — ABNORMAL HIGH (ref 70–99)
Glucose-Capillary: 221 mg/dL — ABNORMAL HIGH (ref 70–99)
Glucose-Capillary: 222 mg/dL — ABNORMAL HIGH (ref 70–99)
Glucose-Capillary: 230 mg/dL — ABNORMAL HIGH (ref 70–99)
Glucose-Capillary: 249 mg/dL — ABNORMAL HIGH (ref 70–99)
Glucose-Capillary: 294 mg/dL — ABNORMAL HIGH (ref 70–99)
Glucose-Capillary: 324 mg/dL — ABNORMAL HIGH (ref 70–99)

## 2024-02-23 LAB — RESPIRATORY PANEL BY PCR

## 2024-02-23 LAB — BLOOD GAS, VENOUS
Acid-base deficit: 9.6 mmol/L — ABNORMAL HIGH (ref 0.0–2.0)
Acid-base deficit: 7.9 mmol/L — ABNORMAL HIGH (ref 0.0–2.0)
Bicarbonate: 15.7 mmol/L — ABNORMAL LOW (ref 20.0–28.0)
Bicarbonate: 16.7 mmol/L — ABNORMAL LOW (ref 20.0–28.0)
Drawn by: 7049
Drawn by: 7049
O2 Saturation: 55.8 %
O2 Saturation: 81.5 %
Patient temperature: 37
Patient temperature: 37.8
pCO2, Ven: 32 mmHg — ABNORMAL LOW (ref 44–60)
pCO2, Ven: 32 mmHg — ABNORMAL LOW (ref 44–60)
pH, Ven: 7.3 (ref 7.25–7.43)
pH, Ven: 7.33 (ref 7.25–7.43)
pO2, Ven: 34 mmHg (ref 32–45)
pO2, Ven: 52 mmHg — ABNORMAL HIGH (ref 32–45)

## 2024-02-23 LAB — URINALYSIS, ROUTINE W REFLEX MICROSCOPIC
Bacteria, UA: NONE SEEN
Bilirubin Urine: NEGATIVE
Glucose, UA: >=500 mg/dL — AB
Hgb urine dipstick: NEGATIVE
Ketones, ur: NEGATIVE mg/dL
Leukocytes,Ua: NEGATIVE
Nitrite: NEGATIVE
Protein, ur: NEGATIVE mg/dL
Specific Gravity, Urine: 1.012 (ref 1.005–1.030)
pH: 5 (ref 5.0–8.0)

## 2024-02-23 LAB — CBC
HCT: 32.6 % — ABNORMAL LOW (ref 36.0–46.0)
Hemoglobin: 10.5 g/dL — ABNORMAL LOW (ref 12.0–15.0)
MCH: 28.5 pg (ref 26.0–34.0)
MCHC: 32.2 g/dL (ref 30.0–36.0)
MCV: 88.6 fL (ref 80.0–100.0)
Platelets: 324 K/uL (ref 150–400)
RBC: 3.68 MIL/uL — ABNORMAL LOW (ref 3.87–5.11)
RDW: 14.5 % (ref 11.5–15.5)
WBC: 19.2 K/uL — ABNORMAL HIGH (ref 4.0–10.5)
nRBC: 0 % (ref 0.0–0.2)

## 2024-02-23 LAB — LACTIC ACID, PLASMA
Lactic Acid, Venous: 3.7 mmol/L (ref 0.5–1.9)
Lactic Acid, Venous: 4 mmol/L (ref 0.5–1.9)
Lactic Acid, Venous: 4.4 mmol/L (ref 0.5–1.9)

## 2024-02-23 LAB — BLOOD GAS, ARTERIAL
Acid-base deficit: 7.6 mmol/L — ABNORMAL HIGH (ref 0.0–2.0)
Bicarbonate: 15.7 mmol/L — ABNORMAL LOW (ref 20.0–28.0)
Drawn by: 22223
FIO2: 68 %
O2 Saturation: 90.2 %
Patient temperature: 37
pCO2 arterial: 26 mmHg — ABNORMAL LOW (ref 32–48)
pH, Arterial: 7.39 (ref 7.35–7.45)
pO2, Arterial: 57 mmHg — ABNORMAL LOW (ref 83–108)

## 2024-02-23 LAB — CBG MONITORING, ED
Glucose-Capillary: 355 mg/dL — ABNORMAL HIGH (ref 70–99)
Glucose-Capillary: 383 mg/dL — ABNORMAL HIGH (ref 70–99)
Glucose-Capillary: 422 mg/dL — ABNORMAL HIGH (ref 70–99)

## 2024-02-23 LAB — TROPONIN T, HIGH SENSITIVITY
Troponin T High Sensitivity: 18 ng/L (ref 0–19)
Troponin T High Sensitivity: 21 ng/L — ABNORMAL HIGH (ref 0–19)

## 2024-02-23 LAB — BETA-HYDROXYBUTYRIC ACID
Beta-Hydroxybutyric Acid: 0.45 mmol/L — ABNORMAL HIGH (ref 0.05–0.27)
Beta-Hydroxybutyric Acid: <0.05 mmol/L — ABNORMAL LOW (ref 0.05–0.27)
Beta-Hydroxybutyric Acid: 0.07 mmol/L (ref 0.05–0.27)

## 2024-02-23 LAB — MRSA NEXT GEN BY PCR, NASAL: MRSA by PCR Next Gen: NOT DETECTED

## 2024-02-23 LAB — PRO BRAIN NATRIURETIC PEPTIDE: Pro Brain Natriuretic Peptide: 146 pg/mL (ref <300.0)

## 2024-02-23 MED ORDER — METHYLPREDNISOLONE SODIUM SUCC 40 MG IJ SOLR
40.0000 mg | Freq: Two times a day (BID) | INTRAMUSCULAR | Status: DC
  Administered 2024-02-23 – 2024-02-27 (×8): 40 mg via INTRAVENOUS
  Filled 2024-02-23 (×8): qty 1

## 2024-02-23 MED ORDER — MORPHINE SULFATE (PF) 4 MG/ML IV SOLN
4.0000 mg | INTRAVENOUS | Status: DC | PRN
  Administered 2024-02-23 (×4): 4 mg via INTRAVENOUS
  Filled 2024-02-23 (×4): qty 1

## 2024-02-23 MED ORDER — PIPERACILLIN-TAZOBACTAM 3.375 G IVPB
3.3750 g | Freq: Three times a day (TID) | INTRAVENOUS | Status: DC
  Administered 2024-02-23 – 2024-02-27 (×13): 3.375 g via INTRAVENOUS
  Filled 2024-02-23 (×13): qty 50

## 2024-02-23 MED ORDER — PANTOPRAZOLE SODIUM 40 MG IV SOLR
40.0000 mg | INTRAVENOUS | Status: DC
  Administered 2024-02-23 – 2024-03-01 (×8): 40 mg via INTRAVENOUS
  Filled 2024-02-23 (×9): qty 10

## 2024-02-23 MED ORDER — LACTATED RINGERS IV SOLN: INTRAVENOUS | Status: DC

## 2024-02-23 MED ORDER — LACTATED RINGERS IV BOLUS (SEPSIS)
1000.0000 mL | Freq: Once | INTRAVENOUS | Status: AC
  Administered 2024-02-23: 1000 mL via INTRAVENOUS

## 2024-02-23 MED ORDER — DM-GUAIFENESIN ER 30-600 MG PO TB12
1.0000 | ORAL_TABLET | Freq: Two times a day (BID) | ORAL | Status: DC
  Administered 2024-02-23 – 2024-03-02 (×16): 1 via ORAL
  Filled 2024-02-23 (×16): qty 1

## 2024-02-23 MED ORDER — POTASSIUM CHLORIDE 10 MEQ/100ML IV SOLN
10.0000 meq | INTRAVENOUS | Status: DC
  Administered 2024-02-23: 10 meq via INTRAVENOUS
  Filled 2024-02-23 (×2): qty 100

## 2024-02-23 MED ORDER — FENTANYL CITRATE (PF) 100 MCG/2ML IJ SOLN
50.0000 ug | Freq: Once | INTRAMUSCULAR | Status: AC
  Administered 2024-02-23: 50 ug via INTRAVENOUS
  Filled 2024-02-23: qty 2

## 2024-02-23 MED ORDER — VANCOMYCIN HCL IN DEXTROSE 1-5 GM/200ML-% IV SOLN
1000.0000 mg | INTRAVENOUS | Status: DC
  Administered 2024-02-23: 1000 mg via INTRAVENOUS
  Filled 2024-02-23: qty 200

## 2024-02-23 MED ORDER — IPRATROPIUM-ALBUTEROL 0.5-2.5 (3) MG/3ML IN SOLN
3.0000 mL | RESPIRATORY_TRACT | Status: DC | PRN
  Administered 2024-02-23 – 2024-02-25 (×4): 3 mL via RESPIRATORY_TRACT
  Filled 2024-02-23 (×4): qty 3

## 2024-02-23 MED ORDER — IPRATROPIUM-ALBUTEROL 0.5-2.5 (3) MG/3ML IN SOLN
3.0000 mL | RESPIRATORY_TRACT | Status: DC
  Administered 2024-02-24 – 2024-02-25 (×10): 3 mL via RESPIRATORY_TRACT
  Filled 2024-02-23 (×11): qty 3

## 2024-02-23 MED ORDER — DEXTROSE IN LACTATED RINGERS 5 % IV SOLN: INTRAVENOUS | Status: DC

## 2024-02-23 MED ORDER — DEXTROSE 50 % IV SOLN: 0.0000 mL | INTRAVENOUS | Status: DC | PRN

## 2024-02-23 MED ORDER — BUDESONIDE 0.25 MG/2ML IN SUSP
0.2500 mg | Freq: Two times a day (BID) | RESPIRATORY_TRACT | Status: DC
  Administered 2024-02-23 – 2024-02-24 (×2): 0.25 mg via RESPIRATORY_TRACT
  Filled 2024-02-23 (×2): qty 2

## 2024-02-23 MED ORDER — SODIUM CHLORIDE 0.9 % IV SOLN: INTRAVENOUS | Status: AC

## 2024-02-23 MED ORDER — BENZONATATE 100 MG PO CAPS
200.0000 mg | ORAL_CAPSULE | Freq: Three times a day (TID) | ORAL | Status: DC
  Administered 2024-02-23 – 2024-03-02 (×25): 200 mg via ORAL
  Filled 2024-02-23 (×25): qty 2

## 2024-02-23 MED ORDER — INSULIN REGULAR(HUMAN) IN NACL 100-0.9 UT/100ML-% IV SOLN
INTRAVENOUS | Status: DC
  Administered 2024-02-23: 8 [IU]/h via INTRAVENOUS
  Filled 2024-02-23: qty 100

## 2024-02-23 MED ORDER — DEXTROMETHORPHAN POLISTIREX ER 30 MG/5ML PO SUER
30.0000 mg | Freq: Once | ORAL | Status: AC
  Administered 2024-02-23: 30 mg via ORAL
  Filled 2024-02-23: qty 5

## 2024-02-23 MED ORDER — INSULIN ASPART 100 UNIT/ML IJ SOLN
0.0000 [IU] | INTRAMUSCULAR | Status: DC
  Administered 2024-02-23: 11 [IU] via SUBCUTANEOUS
  Administered 2024-02-24 (×2): 4 [IU] via SUBCUTANEOUS
  Administered 2024-02-24: 3 [IU] via SUBCUTANEOUS
  Administered 2024-02-24: 4 [IU] via SUBCUTANEOUS
  Administered 2024-02-24: 7 [IU] via SUBCUTANEOUS
  Administered 2024-02-24 – 2024-02-27 (×11): 4 [IU] via SUBCUTANEOUS
  Administered 2024-02-27 (×2): 3 [IU] via SUBCUTANEOUS
  Administered 2024-02-27 – 2024-02-28 (×2): 4 [IU] via SUBCUTANEOUS
  Administered 2024-02-28: 7 [IU] via SUBCUTANEOUS
  Administered 2024-02-28: 4 [IU] via SUBCUTANEOUS
  Administered 2024-02-28 (×2): 7 [IU] via SUBCUTANEOUS
  Administered 2024-02-28 – 2024-02-29 (×2): 4 [IU] via SUBCUTANEOUS
  Administered 2024-02-29: 3 [IU] via SUBCUTANEOUS
  Administered 2024-02-29: 15 [IU] via SUBCUTANEOUS
  Administered 2024-02-29: 4 [IU] via SUBCUTANEOUS
  Administered 2024-02-29 – 2024-03-01 (×3): 15 [IU] via SUBCUTANEOUS
  Administered 2024-03-01: 4 [IU] via SUBCUTANEOUS
  Administered 2024-03-01: 7 [IU] via SUBCUTANEOUS
  Administered 2024-03-02: 3 [IU] via SUBCUTANEOUS
  Administered 2024-03-02 (×2): 4 [IU] via SUBCUTANEOUS

## 2024-02-23 MED ORDER — IPRATROPIUM-ALBUTEROL 0.5-2.5 (3) MG/3ML IN SOLN
RESPIRATORY_TRACT | Status: AC
  Administered 2024-02-23: 3 mL
  Filled 2024-02-23: qty 3

## 2024-02-23 MED ORDER — SODIUM CHLORIDE 0.9 % IV SOLN
2.0000 g | Freq: Once | INTRAVENOUS | Status: AC
  Administered 2024-02-23: 2 g via INTRAVENOUS
  Filled 2024-02-23: qty 20

## 2024-02-23 MED ORDER — IPRATROPIUM-ALBUTEROL 0.5-2.5 (3) MG/3ML IN SOLN: 3.0000 mL | Freq: Three times a day (TID) | RESPIRATORY_TRACT | Status: DC

## 2024-02-23 MED ORDER — ORAL CARE MOUTH RINSE: 15.0000 mL | OROMUCOSAL | Status: DC | PRN

## 2024-02-23 MED ORDER — LACTATED RINGERS IV BOLUS
1000.0000 mL | Freq: Once | INTRAVENOUS | Status: AC
  Administered 2024-02-23: 1000 mL via INTRAVENOUS

## 2024-02-23 MED ORDER — METHYLPREDNISOLONE SODIUM SUCC 125 MG IJ SOLR
125.0000 mg | Freq: Once | INTRAMUSCULAR | Status: AC
  Administered 2024-02-23: 125 mg via INTRAVENOUS
  Filled 2024-02-23: qty 2

## 2024-02-23 MED ORDER — GUAIFENESIN ER 600 MG PO TB12
1200.0000 mg | ORAL_TABLET | Freq: Two times a day (BID) | ORAL | Status: DC
  Administered 2024-02-23: 1200 mg via ORAL
  Filled 2024-02-23: qty 2

## 2024-02-23 MED ORDER — FENTANYL CITRATE (PF) 50 MCG/ML IJ SOSY
12.5000 ug | PREFILLED_SYRINGE | INTRAMUSCULAR | Status: DC | PRN
  Administered 2024-02-23 – 2024-02-25 (×9): 12.5 ug via INTRAVENOUS
  Filled 2024-02-23 (×9): qty 1

## 2024-02-23 MED ORDER — CHLORHEXIDINE GLUCONATE CLOTH 2 % EX PADS
6.0000 | MEDICATED_PAD | Freq: Every day | CUTANEOUS | Status: DC
  Administered 2024-02-23 – 2024-03-02 (×8): 6 via TOPICAL

## 2024-02-23 MED ORDER — SODIUM CHLORIDE 0.9 % IV SOLN
500.0000 mg | Freq: Once | INTRAVENOUS | Status: DC
  Filled 2024-02-23: qty 5

## 2024-02-23 MED ORDER — ALBUTEROL SULFATE (2.5 MG/3ML) 0.083% IN NEBU: 2.5000 mg | INHALATION_SOLUTION | RESPIRATORY_TRACT | Status: DC | PRN

## 2024-02-23 MED ORDER — ONDANSETRON HCL 4 MG/2ML IJ SOLN
4.0000 mg | Freq: Four times a day (QID) | INTRAMUSCULAR | Status: DC | PRN
  Administered 2024-02-23 – 2024-02-26 (×4): 4 mg via INTRAVENOUS
  Filled 2024-02-23 (×4): qty 2

## 2024-02-23 MED ORDER — HYDROCOD POLI-CHLORPHE POLI ER 10-8 MG/5ML PO SUER
5.0000 mL | Freq: Two times a day (BID) | ORAL | Status: DC | PRN
  Administered 2024-02-23 – 2024-02-29 (×4): 5 mL via ORAL
  Filled 2024-02-23 (×4): qty 5

## 2024-02-23 MED ORDER — DIPHENHYDRAMINE HCL 25 MG PO CAPS
25.0000 mg | ORAL_CAPSULE | Freq: Three times a day (TID) | ORAL | Status: DC | PRN
  Administered 2024-02-23 – 2024-02-26 (×2): 25 mg via ORAL
  Filled 2024-02-23 (×2): qty 1

## 2024-02-23 MED ORDER — METHYLPREDNISOLONE SODIUM SUCC 125 MG IJ SOLR
60.0000 mg | Freq: Two times a day (BID) | INTRAMUSCULAR | Status: DC
  Administered 2024-02-23: 60 mg via INTRAVENOUS
  Filled 2024-02-23: qty 2

## 2024-02-23 MED ORDER — LACTATED RINGERS IV BOLUS
30.0000 mL/kg | Freq: Once | INTRAVENOUS | Status: AC
  Administered 2024-02-23: 2250 mL via INTRAVENOUS

## 2024-02-23 MED ORDER — ALBUTEROL SULFATE (2.5 MG/3ML) 0.083% IN NEBU
INHALATION_SOLUTION | RESPIRATORY_TRACT | Status: AC
  Administered 2024-02-23: 2.5 mg
  Filled 2024-02-23: qty 3

## 2024-02-23 MED ORDER — VANCOMYCIN HCL 1500 MG/300ML IV SOLN
1500.0000 mg | Freq: Once | INTRAVENOUS | Status: AC
  Administered 2024-02-23: 1500 mg via INTRAVENOUS
  Filled 2024-02-23: qty 300

## 2024-02-23 MED ORDER — HEPARIN SODIUM (PORCINE) 5000 UNIT/ML IJ SOLN
5000.0000 [IU] | Freq: Three times a day (TID) | INTRAMUSCULAR | Status: DC
  Administered 2024-02-23 – 2024-03-01 (×24): 5000 [IU] via SUBCUTANEOUS
  Filled 2024-02-23 (×25): qty 1

## 2024-02-23 MED ORDER — ORAL CARE MOUTH RINSE
15.0000 mL | OROMUCOSAL | Status: DC
  Administered 2024-02-23 (×4): 15 mL via OROMUCOSAL

## 2024-02-23 MED ORDER — INSULIN GLARGINE-YFGN 100 UNIT/ML ~~LOC~~ SOLN
15.0000 [IU] | Freq: Two times a day (BID) | SUBCUTANEOUS | Status: DC
  Administered 2024-02-23: 15 [IU] via SUBCUTANEOUS
  Filled 2024-02-23 (×2): qty 0.15

## 2024-02-23 MED ORDER — INSULIN ASPART 100 UNIT/ML IJ SOLN: 0.0000 [IU] | Freq: Three times a day (TID) | INTRAMUSCULAR | Status: DC

## 2024-02-23 MED ORDER — DM-GUAIFENESIN ER 30-600 MG PO TB12: 1.0000 | ORAL_TABLET | Freq: Two times a day (BID) | ORAL | Status: DC

## 2024-02-23 MED ORDER — IOHEXOL 350 MG/ML SOLN
75.0000 mL | Freq: Once | INTRAVENOUS | Status: AC | PRN
  Administered 2024-02-23: 75 mL via INTRAVENOUS

## 2024-02-23 MED ORDER — ACETAMINOPHEN 500 MG PO TABS
1000.0000 mg | ORAL_TABLET | Freq: Four times a day (QID) | ORAL | Status: DC | PRN
  Administered 2024-02-23: 1000 mg via ORAL
  Filled 2024-02-23: qty 2

## 2024-02-23 MED ORDER — INSULIN ASPART 100 UNIT/ML IJ SOLN
0.0000 [IU] | Freq: Every day | INTRAMUSCULAR | Status: DC
  Administered 2024-02-23: 2 [IU] via SUBCUTANEOUS

## 2024-02-23 NOTE — Inpatient Diabetes Management (Signed)
 Inpatient Diabetes Program Recommendations  AACE/ADA: New Consensus Statement on Inpatient Glycemic Control (2015)  Target Ranges:  Prepandial:   less than 140 mg/dL      Peak postprandial:   less than 180 mg/dL (1-2 hours)      Critically ill patients:  140 - 180 mg/dL   Lab Results  Component Value Date   GLUCAP 212 (H) 02/23/2024   HGBA1C 9.6 (H) 09/21/2023    Review of Glycemic Control  Diabetes history: Diabetes type 1, well controlled Outpatient Diabetes medications: Insulin  pump, Dexcom G6 CGM Current orders for Inpatient glycemic control:  IV insulin /Endotool/DKA  Sepsis PNA, Increased work of breathing this am 10/18 DM type 1, insulin  pump for years last note from us  in 2016  Endocrinologist Dr. Tommas  Ordered solumedrol 60 mg Q12. Will need to check for pump settings tomorrow 10/19.   Diabetes coordinator works remotely over the weekend but is available via phone and pager if needed.   Thanks,  Clotilda Bull RN, MSN, BC-ADM Inpatient Diabetes Coordinator Team Pager [phone] (8a-5p)

## 2024-02-23 NOTE — Progress Notes (Signed)
 Date and time results received: 02/23/24 0911   Test: Lactic Critical Value: 3.7  Name of Provider Notified: madera  Orders Received? Or Actions Taken?:  Dr Ricky and primary RN Rosina informed.  Kellogg RN

## 2024-02-23 NOTE — Progress Notes (Signed)
 Patient seen and examined; admitted after midnight secondary to DKA and multifocal pneumonia.  Component of acute respiratory failure with hypoxia appreciated.  Patient reports no nausea, no vomiting or chest pain currently.  Complaining of ongoing intermittent mildly productive coughing spells.  Please refer to H&P written by Dr. Manfred on 02/23/2024 for further info/details on admission.  Plan: - Continue to maintain adequate hydration - Follow CBG fluctuations and once DKA is corrected we will transition to sliding scale insulin , twice a day Semglee  (long-acting insulin ) and meal coverage. - Patient CBG expected to fluctuate on the high end in the setting of steroids therapy. -Follow culture results and continue IV antibiotics. -Will also follow electrolytes and replete them as needed.  Eric Nunnery MD [phone]

## 2024-02-23 NOTE — Progress Notes (Addendum)
 Notified RT of increased WOB, and sats 81% on 6L nasal cannula.  RT at bedside.  Provider notified.

## 2024-02-23 NOTE — TOC CM/SW Note (Signed)
 Transition of Care [facility]) - Inpatient Brief Assessment   Patient Details  Name: [name] MRN: [id number] Date of Birth: 04-Aug-1980  Transition of Care [facility]) CM/SW Contact:    Lucie Lunger, LCSWA Phone Number: 02/23/2024, 9:25 AM   Clinical Narrative: Transition of Care Department [facility]) has reviewed patient and no TOC needs have been identified at this time. We will continue to monitor patient advancement through interdiciplinary progression rounds. If new patient transition needs arise, please place a TOC consult.  Transition of Care Asessment: Insurance and Status: Insurance coverage has been reviewed Patient has primary care physician: No (PCP list added to AVS) Home environment has been reviewed: From home Prior level of function:: Independent Prior/Current Home Services: No current home services Social Drivers of Health Review: SDOH reviewed no interventions necessary Readmission risk has been reviewed: Yes Transition of care needs: no transition of care needs at this time

## 2024-02-23 NOTE — Progress Notes (Signed)
 Pharmacy Antibiotic Note  [name] is a 43 y.o. female admitted on 02/22/2024 with pneumonia.  Pharmacy has been consulted for Vancomycin dosing. WBC is elevated. Mild bump in Scr. CXR with multi-focal PNA. Chronic immunosuppression.  Plan: Vancomycin 1000 mg IV q24h >>>Estimated AUC: 475 Zosyn per MD Trend WBC, temp, renal function  F/U infectious work-up Drug levels as indicated   Height: 5' 3 (160 cm) Weight: 75 kg (165 lb 5.5 oz) IBW/kg (Calculated) : 52.4  Temp (24hrs), Avg:98.3 F (36.8 C), Min:98 F (36.7 C), Max:98.6 F (37 C)  Recent Labs  Lab 02/22/24 2348 02/23/24 0329  WBC 18.9*  --   CREATININE 1.41*  --   LATICACIDVEN 4.0* 4.4*    Estimated Creatinine Clearance: 48.8 mL/min (A) (by C-G formula based on SCr of 1.41 mg/dL (H)).    Allergies  Allergen Reactions   Strawberry Extract Dermatitis    Pt states she was allergic as a child but not so far as an adult.   Lynwood Mckusick, PharmD, BCPS Clinical Pharmacist Phone: [phone]

## 2024-02-23 NOTE — Sepsis Progress Note (Signed)
 Elink monitoring for the code sepsis protocol.

## 2024-02-23 NOTE — Discharge Instructions (Addendum)
 Providers Accepting New Patients in [place], [place]    [facility] [address]  [place], [place] [address] [phone] Accepts most insurances  [facility] [address] [place], [place] [address] [phone] Accepts most insurances  [facility] [address] [place], [place] [address]  [phone] Must meet requirements  [facility] [address] [place], [place] [address] [phone] Accepts most insurances  [facility] [address]  [place], [place] [address] [phone] Accepts most insurances  [facility] [address]   [place], [place]   [phone] Accepts most insurances  [facility])  [phone] [address]  [place], [place] [address] [phone] Accepts most insurances     [facility] [address]  [place], [place] [address] [phone] Accepts most insurances  [facility] [address] [place], [place] [address] [phone] option 1 Accepts Medicaid and [facility] [address]  [place], [place] [address] [phone] Accepts most insurances  Benita Outhouse, MD [address] [place], [place] [address] [phone] Accepts most insurances  [facility] [address]  [place], [place] [address] [phone] Accepts most insurances  [facility] [phone] [address] [place], [place] [address] [phone] Accepts most insurances  [place] [place] [address] [place], [place] [address] [phone]  Accepts most insurances   IMPORTANT INFORMATION: PAY CLOSE ATTENTION   PHYSICIAN DISCHARGE INSTRUCTIONS  Follow with Primary care provider  and other consultants as instructed by your Hospitalist Physician  SEEK MEDICAL CARE OR RETURN TO EMERGENCY ROOM IF SYMPTOMS COME BACK, WORSEN OR  NEW PROBLEM DEVELOPS   Please note: You were cared for by a hospitalist during your hospital stay. Every effort will be made to forward records to your primary care provider.  You can request that your primary care provider send for your hospital records if they have not received them.  Once you are discharged, your primary care physician will handle any further medical issues. Please note that NO REFILLS for any discharge medications will be authorized once you are discharged, as it is imperative that you return to your primary care physician (or establish a relationship with a primary care physician if you do not have one) for your post hospital discharge needs so that they can reassess your need for medications and monitor your lab values.  Please get a complete blood count and chemistry panel checked by your Primary MD at your next visit, and again as instructed by your Primary MD.  Get Medicines reviewed and adjusted: Please take all your medications with you for your next visit with your Primary MD  Laboratory/radiological data: Please request your Primary MD to go over all hospital tests and procedure/radiological results at the follow up, please ask your primary care provider to get all Hospital records sent to his/her office.  In some cases, they will be blood work, cultures and biopsy results pending at the time of your discharge. Please request that your primary care provider follow up on these results.  If you are diabetic, please bring your blood sugar readings with you to your follow up appointment with primary care.    Please call and make your follow up appointments as soon as possible.    Also Note the following: If you experience worsening of your admission symptoms, develop shortness of breath,  life threatening emergency, suicidal or homicidal thoughts you must seek medical attention immediately by calling 911 or calling your MD immediately  if symptoms less severe.  You must  read complete instructions/literature along with all the possible adverse reactions/side effects for all the Medicines you take and that have been prescribed to you. Take any new Medicines after you have completely understood and accpet all the possible adverse reactions/side effects.   Do not drive when taking Pain medications or sleeping medications (Benzodiazepines)  Do not take more than prescribed Pain, Sleep and Anxiety Medications. It is not advisable to combine anxiety,sleep and pain medications without talking with your primary care practitioner  Special Instructions: If you have smoked or chewed Tobacco  in the last 2 yrs please stop smoking, stop any regular Alcohol  and or any Recreational drug use.  Wear Seat belts while driving.  Do not drive if taking any narcotic, mind altering or controlled substances or recreational drugs or alcohol.

## 2024-02-23 NOTE — H&P (Addendum)
 History and Physical    Patient: [name] FMW:[id number] DOB: 1980-11-23 DOA: 02/22/2024 DOS: the patient was seen and examined on 02/23/2024 PCP: Patient, No Pcp Per  Patient coming from: Home  Chief Complaint:  Chief Complaint  Patient presents with   Shortness of Breath   HPI: [name] is a 43 y.o. female with medical history significant of Lupus, Type 1 diabetes, cervical HSIL, and hypertension who was brought to the ED via EMS from local fire station endorsing few days of productive cough, increasing SOB this evening. Tried to drive here but had to stop at [facility] where she was reportedly hypoxic in the low 80s on room air. Given nebs, solumedrol and IVF and started on supplemental oxygen with some improvement.  Review of labs shows patient with elevated white count, elevated lactic acid level and multifocal pneumonia on xray consistent with sepsis. Blood cultures and ua collected tin ED. Elevated glucose with anion gap acidosis, BHA pending, loikely DKA    Review of Systems: As mentioned in the history of present illness. All other systems reviewed and are negative. Past Medical History:  Diagnosis Date   Diabetes mellitus without complication (HCC)    Hypertension    Lupus    Past Surgical History:  Procedure Laterality Date   CERVICAL CONIZATION W/BX N/A 09/26/2023   Procedure: CONE BIOPSY, CERVIX;  Surgeon: Jayne Vonn DEL, MD;  Location: [facility];  Service: Gynecology;  Laterality: N/A;   SHOULDER SURGERY Left    UTERINE FIBROID SURGERY     WRIST SURGERY Right    Social History:  reports that she has been smoking cigarettes. She uses smokeless tobacco. She reports that she does not drink alcohol and does not use drugs.  Allergies  Allergen Reactions   Strawberry Extract Dermatitis    Pt states she was allergic as a child but not so far as an adult.    History reviewed. No pertinent family history.  Prior to Admission medications   Medication Sig Start  Date End Date Taking? Authorizing Provider  amLODipine (NORVASC) 2.5 MG tablet Take 2.5 mg by mouth daily. 08/18/23   [provider]  amoxicillin-clavulanate (AUGMENTIN) 875-125 MG tablet Take 1 tablet by mouth every 12 (twelve) hours. 02/08/24   Towana Ozell BROCKS, MD  aspirin EC 81 MG tablet Take 81 mg by mouth daily. Swallow whole.    [provider]  Belimumab  (BENLYSTA  IV) Inject into the vein.    [provider]  HYDROcodone -acetaminophen  (NORCO/VICODIN) 5-325 MG tablet Take 1 tablet by mouth every 6 (six) hours as needed. 09/26/23   Jayne Vonn DEL, MD  hydroxychloroquine (PLAQUENIL) 200 MG tablet Take 200 mg by mouth 2 (two) times daily.    [provider]  insulin  lispro (HUMALOG) 100 UNIT/ML injection Inject 0-100 Units into the skin See admin instructions. INJECT 100 UNITS SUBCUTANEOUSLY PER DAY MAX VIA PUMP    [provider]  ketorolac  (TORADOL ) 10 MG tablet Take 1 tablet (10 mg total) by mouth every 8 (eight) hours as needed. 09/26/23   Jayne Vonn DEL, MD  Lancets (ACCU-CHEK MULTICLIX) lancets Use as instructed 08/01/14   Dhungel, Nishant, MD  lisinopril  (ZESTRIL ) 20 MG tablet Take 20 mg by mouth daily.    [provider]  LORazepam  (ATIVAN ) 0.5 MG tablet Take 0.5 mg by mouth 2 (two) times daily.    [provider]  medroxyPROGESTERone  (PROVERA ) 10 MG tablet Take 1 tablet (10 mg total) by mouth daily. Patient not taking:  Reported on 12/18/2023 08/03/23   Jayne Vonn DEL, MD  mycophenolate (CELLCEPT) 500 MG tablet Take 1,500 mg by mouth 2 (two) times daily.    [provider]  nicotine  (NICODERM CQ  - DOSED IN MG/24 HOURS) 14 mg/24hr patch Place 1 patch (14 mg total) onto the skin daily. 12/18/23   Mannam, Praveen, MD  ondansetron  (ZOFRAN ) 4 MG tablet Take 1 tablet (4 mg total) by mouth every 6 (six) hours as needed for nausea. Patient not taking: Reported on 12/18/2023 05/14/17   Cheryle Page, MD  ondansetron  (ZOFRAN -ODT) 8  MG disintegrating tablet Take 1 tablet (8 mg total) by mouth every 8 (eight) hours as needed for nausea or vomiting. 02/08/24   Towana Ozell BROCKS, MD  oxyCODONE-acetaminophen  (PERCOCET/ROXICET) 5-325 MG tablet Take 1 tablet by mouth every 6 (six) hours as needed for severe pain (pain score 7-10). 02/08/24   Towana Ozell BROCKS, MD  pantoprazole  (PROTONIX ) 40 MG tablet Take 1 tablet (40 mg total) by mouth daily. Patient not taking: Reported on 12/18/2023 05/14/17   Cheryle Page, MD  predniSONE (DELTASONE) 10 MG tablet Take 15 mg by mouth daily with breakfast.    [provider]  pregabalin (LYRICA) 150 MG capsule Take 150 mg by mouth 3 (three) times daily.    [provider]  promethazine  (PHENERGAN ) 25 MG tablet Take 1 tablet (25 mg total) by mouth every 6 (six) hours as needed for nausea or vomiting. Patient not taking: Reported on 12/18/2023 04/24/18   Norris Will PARAS, PA-C  rosuvastatin (CRESTOR) 10 MG tablet Take 10 mg by mouth daily.    [provider]  sertraline  (ZOLOFT ) 100 MG tablet Take 200 mg by mouth daily.    [provider]  Varenicline  Tartrate, Starter, (CHANTIX  STARTING MONTH PAK) 0.5 MG X 11 & 1 MG X 42 TBPK Take 1 tablet by mouth daily. 12/18/23   Mannam, Praveen, MD     Physical Exam: Vitals:   02/22/24 2330 02/23/24 0000 02/23/24 0055 02/23/24 0230  BP: (!) 130/55     Pulse: 97 98 94 94  Resp: (!) 24 (!) 25 20 18   Temp:      TempSrc:      SpO2: 93% 92% 94% 95%  Weight:      Height:        Data Reviewed: All labs and images reviewed by me    Latest Ref Rng & Units 02/22/2024   11:48 PM 02/08/2024    2:01 PM 09/21/2023   10:02 AM  BMP  Glucose 70 - 99 mg/dL 644  757  697   BUN 6 - 20 mg/dL 21  30  32   Creatinine 0.44 - 1.00 mg/dL 8.58  8.72  8.68   Sodium 135 - 145 mmol/L 132  136  130   Potassium 3.5 - 5.1 mmol/L 3.9  4.7  4.7   Chloride 98 - 111 mmol/L 99  106  99   CO2 22 - 32 mmol/L 16  16  19    Calcium  8.9 - 10.3 mg/dL  8.1  8.8  9.7        Latest Ref Rng & Units 02/22/2024   11:48 PM 02/08/2024    2:01 PM 09/21/2023   10:02 AM  CBC  WBC 4.0 - 10.5 K/uL 18.9  8.9  8.1   Hemoglobin 12.0 - 15.0 g/dL 88.8  86.0  87.6   Hematocrit 36.0 - 46.0 % 35.6  43.2  37.6   Platelets 150 - 400  K/uL 306  184  409     EKG reviewed by me - agree with ED MD interpretation Date/Time:                  Friday February 22 2024 23:23:00 EDT Ventricular Rate:         98 PR Interval:                 123 QRS Duration:             100 QT Interval:                 348 QTC Calculation:445 R Axis:                         74   Text Interpretation:Sinus rhythm LAE, consider biatrial enlargement RSR' in V1 or V2, right VCD or RVH Since last tracing Rate faster Confirmed by Roselyn Dunnings [id number]) on 02/22/2024 11:34:35 PM   Procedures Assessment and Plan:  \General:  Awake and alert and oriented x3. Mild distress with coughing HEENT: Normocephalic atraumatic.  Pupils equal, round, reactive to light. Extraocular muscles are intact. Sclerae anicteric and noninjected.  Dry mucosal membranes. No mucosal lesions. Face mask/butterfly redness and characteristic moon fae r/t chronic steroids and lupus Neck: Neck supple. No carotid bruits. No masses palpated.  Cardiovascular: Regular rate with normal S1-S2 sounds. No murmurs, rubs, gallops auscultated. No JVD. 2+ dorsalis pedis and radial pulses. Respiratory: good air movement. Inspiratory wheeze and rhonchi scattered throughout Abdomen: Soft, nontender, nondistended. Active bowel sounds. No masses or hepatosplenomegaly  Musculoskeletal: No calf or leg pain. Multiple joints affected with knots swelling and pain Psychiatric: Intact judgment and insight. Pleasant and cooperative. Neurologic: No focal neurological deficits. Strength is 5/5 and symmetric in upper and lower extremities.  Cranial nerves II through XII are grossly intact.    Sepsis secondary to pneumonia -continue generous fluid  resuscitation and trend lactate -antibiotics changed to vanc and zosyn (immunosuppression)  -solumedrol 60 q 12h -follow up cultures and resp panel -f/u xray with CTA chest for further PNA clarification and eval for PE given high risk factors  Acute resp failure with hypoxia -wean oxygen as tolerated, initially required 4 L now at 2 (no baseline oxygen requirement) -duonebs for wheezing -IS and fluttter valve -tobacco cessation counseling  DKA - type 1 diabetic managed on insulin  pump. Patient endorses hypoglycemic events during the night so she lets it run higher  -IV insulin  per endotool -fluid resuscitation -consult diabetes coordinator -monitor metabolic chemistry/replace electrolytes   AKI - secondary to acidosis and dehydration -IV fluids -monitor renal function -meds renal dosing - pharmacy consult -hold lisinopril   Lupus - meds benlysta  (recently changed to new med), mycophenolate, plaquenil and prednisone (recently weaned to 10 mg daily) - hold immunosuppressants until acute infection improves -high dose steroids  -monitor for complications  Hypertension -hold antihypertensives for now  Tobacco abuse -cessation counseling -nicotine  patch offered      Advance Care Planning:  FULL CODE  VTE prophylaxis:  heparin   Consults: none  Family Communication: husband at bedside  Severity of Illness: The appropriate patient status for this patient is INPATIENT. Inpatient status is judged to be reasonable and necessary in order to provide the required intensity of service to ensure the patient's safety. The patient's presenting symptoms, physical exam findings, and initial radiographic and laboratory data in the context of their chronic comorbidities is felt to place them at high risk  for further clinical deterioration. Furthermore, it is not anticipated that the patient will be medically stable for discharge from the hospital within 2 midnights of admission.   * I  certify that at the point of admission it is my clinical judgment that the patient will require inpatient hospital care spanning beyond 2 midnights from the point of admission due to high intensity of service, high risk for further deterioration and high frequency of surveillance required.* CRITICAL CARE Performed by: Erminio Cone   Total critical care time: 60 minutes  Critical care time was exclusive of separately billable procedures and treating other patients.  Critical care was necessary to treat or prevent imminent or life-threatening deterioration.  Critical care was time spent personally by me on the following activities: development of treatment plan with patient and/or surrogate as well as nursing, discussions with consultants, evaluation of patient's response to treatment, examination of patient, obtaining history from patient or surrogate, ordering and performing treatments and interventions, ordering and review of laboratory studies, ordering and review of radiographic studies, pulse oximetry and re-evaluation of patient's condition.  Author: Erminio Cone, NP 02/23/2024 3:47 AM  Patient was seen and examined personally.  Patient's chart including labs, imaging and notes reviewed. I discussed with the Nurse Practitioner and agree with above assessment and plan with following addendum:    For on call review [email or web address].

## 2024-02-23 NOTE — Significant Event (Signed)
 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  Likely ARDs on CT HPI see H & P by me early am CT and labs reviewed + rhinovirus, BC no growth x 12h Per review of chart breathing has progressively worsened throughout the day with increasing oxygen requirements Is now off insulin  drip but was unable to eat. Now nauseated with emesis   Interventions   Assessment/Plan:     02/23/2024    8:13 PM 02/23/2024    7:52 PM 02/23/2024    7:00 PM  Vitals with BMI  Systolic 127 142   Diastolic 78 59   Pulse 100 90 89   Resp rate 30 short shallow at rest HFNC 15 with NRB in place sats 94% Temp                                                                                   100.1 ax General:critically ill, slight lethargic, generalized edema especially noted in the face  Resp course rhonchi and crackles throughout. Reports lateral thoracic musculoskeletal pain from frequent coughing  Abd soft non tender - nausea improving after zofran    Multiple attempts for ABG unsuccessful VBG: 7.33 pCO2 32 HCO3 16.7; No significant change with comparison to this am ABG however her oxygen requirement work of breathing exponentially increased  Discussion with patient and husband likely need for intubation due to worsening and ARDS picture. Patient refused intubation, did agree to use BIPAP again and have abg reevaluated in am  Acetaminophen  1000 every 6 h prn fever mild pain Solu medrol 125 mg x 1 - high dose steroids needed secondary to lupus  Pulmicort nebs BID added Duo nebs scheduled every 4h in addition to prn  Protonix  40 mg IV Q 24 h - effects of GERD seen on CT; current nausea + stress ulcer prophylaxis Stop scheduled and long acting insulin  until patietn can tolerate oral intake - high dose q 4 h  SQ scale ordered Patient will need lupus  pulm reevaluation at discharge if not intubated and done while hospitalized  Morphine  ineffective for pain, increase in creatinine with last bmp results- morphine  changed to fentanyl  for severe pain Utilize heated high flow when off BIPAP ABG and chest xray ordered for am IV team consult for PICC line in am CRITICAL CARE Performed by: Erminio Cone   Total critical care time: 30 minutes  Critical care time was exclusive of separately billable procedures and treating other patients.  Critical care was necessary to treat or prevent imminent or life-threatening deterioration.  Critical care was time spent personally by me on the following activities: development of treatment plan with patient and/or surrogate as well as nursing, discussions with consultants, evaluation of patient's response to treatment, examination of patient, obtaining history from patient or surrogate, ordering and performing treatments and interventions, ordering and review of laboratory studies, ordering and review of radiographic studies, pulse oximetry and re-evaluation of patient's condition.  Erminio LITTIE Cone NP [facility] 7pm-7am - check amion for availability Pager [phone]

## 2024-02-23 NOTE — Sepsis Progress Note (Signed)
 Notified bedside nurse of need to draw repeat lactic acid.

## 2024-02-23 NOTE — Plan of Care (Signed)
  Problem: Education: Goal: Knowledge of General Education information will improve Description: Including pain rating scale, medication(s)/side effects and non-pharmacologic comfort measures Outcome: Progressing   Problem: Nutrition: Goal: Adequate nutrition will be maintained Outcome: Not Progressing   Problem: Pain Managment: Goal: General experience of comfort will improve and/or be controlled Outcome: Not Progressing

## 2024-02-24 ENCOUNTER — Inpatient Hospital Stay (HOSPITAL_COMMUNITY)

## 2024-02-24 DIAGNOSIS — J188 Other pneumonia, unspecified organism: Secondary | ICD-10-CM | POA: Diagnosis not present

## 2024-02-24 DIAGNOSIS — J189 Pneumonia, unspecified organism: Secondary | ICD-10-CM | POA: Diagnosis not present

## 2024-02-24 DIAGNOSIS — J9601 Acute respiratory failure with hypoxia: Secondary | ICD-10-CM

## 2024-02-24 DIAGNOSIS — A419 Sepsis, unspecified organism: Secondary | ICD-10-CM

## 2024-02-24 DIAGNOSIS — E1010 Type 1 diabetes mellitus with ketoacidosis without coma: Secondary | ICD-10-CM

## 2024-02-24 LAB — CBC
HCT: 30.3 % — ABNORMAL LOW (ref 36.0–46.0)
Hemoglobin: 9.8 g/dL — ABNORMAL LOW (ref 12.0–15.0)
MCH: 28.2 pg (ref 26.0–34.0)
MCHC: 32.3 g/dL (ref 30.0–36.0)
MCV: 87.3 fL (ref 80.0–100.0)
Platelets: 256 K/uL (ref 150–400)
RBC: 3.47 MIL/uL — ABNORMAL LOW (ref 3.87–5.11)
RDW: 14.9 % (ref 11.5–15.5)
WBC: 17.4 K/uL — ABNORMAL HIGH (ref 4.0–10.5)
nRBC: 0 % (ref 0.0–0.2)

## 2024-02-24 LAB — GLUCOSE, CAPILLARY
Glucose-Capillary: 189 mg/dL — ABNORMAL HIGH (ref 70–99)
Glucose-Capillary: 170 mg/dL — ABNORMAL HIGH (ref 70–99)
Glucose-Capillary: 165 mg/dL — ABNORMAL HIGH (ref 70–99)
Glucose-Capillary: 224 mg/dL — ABNORMAL HIGH (ref 70–99)
Glucose-Capillary: 156 mg/dL — ABNORMAL HIGH (ref 70–99)
Glucose-Capillary: 144 mg/dL — ABNORMAL HIGH (ref 70–99)

## 2024-02-24 LAB — COMPREHENSIVE METABOLIC PANEL WITH GFR
ALT: 7 U/L (ref 0–44)
AST: 41 U/L (ref 15–41)
Albumin: 2.9 g/dL — ABNORMAL LOW (ref 3.5–5.0)
Alkaline Phosphatase: 79 U/L (ref 38–126)
Anion gap: 13 (ref 5–15)
BUN: 26 mg/dL — ABNORMAL HIGH (ref 6–20)
CO2: 17 mmol/L — ABNORMAL LOW (ref 22–32)
Calcium: 8.3 mg/dL — ABNORMAL LOW (ref 8.9–10.3)
Chloride: 108 mmol/L (ref 98–111)
Creatinine, Ser: 1.59 mg/dL — ABNORMAL HIGH (ref 0.44–1.00)
GFR, Estimated: 40 mL/min — ABNORMAL LOW (ref >60)
Glucose, Bld: 167 mg/dL — ABNORMAL HIGH (ref 70–99)
Potassium: 4.1 mmol/L (ref 3.5–5.1)
Sodium: 138 mmol/L (ref 135–145)
Total Bilirubin: 0.2 mg/dL (ref 0.0–1.2)
Total Protein: 5.3 g/dL — ABNORMAL LOW (ref 6.5–8.1)

## 2024-02-24 LAB — BLOOD GAS, ARTERIAL
Acid-base deficit: 7.7 mmol/L — ABNORMAL HIGH (ref 0.0–2.0)
Bicarbonate: 17.5 mmol/L — ABNORMAL LOW (ref 20.0–28.0)
Drawn by: 22223
O2 Saturation: 97.9 %
Patient temperature: 37
pCO2 arterial: 34 mmHg (ref 32–48)
pH, Arterial: 7.32 — ABNORMAL LOW (ref 7.35–7.45)
pO2, Arterial: 220 mmHg — ABNORMAL HIGH (ref 83–108)

## 2024-02-24 LAB — MAGNESIUM: Magnesium: 1.9 mg/dL (ref 1.7–2.4)

## 2024-02-24 LAB — PHOSPHORUS: Phosphorus: 3.8 mg/dL (ref 2.5–4.6)

## 2024-02-24 MED ORDER — BISACODYL 10 MG RE SUPP
10.0000 mg | Freq: Every day | RECTAL | Status: DC | PRN
  Administered 2024-02-24: 10 mg via RECTAL
  Filled 2024-02-24: qty 1

## 2024-02-24 MED ORDER — PROCHLORPERAZINE EDISYLATE 10 MG/2ML IJ SOLN
10.0000 mg | Freq: Four times a day (QID) | INTRAMUSCULAR | Status: DC | PRN
  Administered 2024-02-25 – 2024-02-26 (×3): 10 mg via INTRAVENOUS
  Filled 2024-02-24 (×3): qty 2

## 2024-02-24 MED ORDER — GUAIFENESIN-DM 100-10 MG/5ML PO SYRP
5.0000 mL | ORAL_SOLUTION | ORAL | Status: DC | PRN
  Administered 2024-02-24 – 2024-03-01 (×9): 5 mL via ORAL
  Filled 2024-02-24 (×9): qty 5

## 2024-02-24 MED ORDER — BUDESONIDE 0.5 MG/2ML IN SUSP
0.5000 mg | Freq: Two times a day (BID) | RESPIRATORY_TRACT | Status: DC
  Administered 2024-02-24 – 2024-03-02 (×14): 0.5 mg via RESPIRATORY_TRACT
  Filled 2024-02-24 (×14): qty 2

## 2024-02-24 MED ORDER — ARFORMOTEROL TARTRATE 15 MCG/2ML IN NEBU
15.0000 ug | INHALATION_SOLUTION | Freq: Two times a day (BID) | RESPIRATORY_TRACT | Status: DC
  Administered 2024-02-24 – 2024-03-02 (×14): 15 ug via RESPIRATORY_TRACT
  Filled 2024-02-24 (×14): qty 2

## 2024-02-24 MED ORDER — DOCUSATE SODIUM 100 MG PO CAPS
100.0000 mg | ORAL_CAPSULE | Freq: Two times a day (BID) | ORAL | Status: AC
Start: 2024-02-24 — End: ?
  Administered 2024-02-28 – 2024-03-01 (×4): 100 mg via ORAL
  Filled 2024-02-24 (×11): qty 1

## 2024-02-24 MED ORDER — POLYETHYLENE GLYCOL 3350 17 G PO PACK
17.0000 g | PACK | Freq: Every day | ORAL | Status: DC
  Administered 2024-02-24 – 2024-03-01 (×3): 17 g via ORAL
  Filled 2024-02-24 (×7): qty 1

## 2024-02-24 NOTE — Plan of Care (Signed)
  Problem: Education: Goal: Knowledge of General Education information will improve Description: Including pain rating scale, medication(s)/side effects and non-pharmacologic comfort measures Outcome: Progressing   Problem: Nutrition: Goal: Adequate nutrition will be maintained Outcome: Progressing   Problem: Pain Managment: Goal: General experience of comfort will improve and/or be controlled Outcome: Not Progressing

## 2024-02-24 NOTE — Progress Notes (Addendum)
 9261: RN made Dr Ricky aware that Right Arm IV infiltrated with NS and Zosyn running. Made Charge RN Zachary aware as patient said was difficult IV stick sometimes requiring Ultrasound placement. Patient also requesting Fentanyl  IV prn for back pain. IV Zoysn and NS were stopped.   At 0740: RN made Elspeth Pharmacist aware that Right Arm IV infiltrated with NS and Zosyn. Pharmacist recommended elevating the arm, heat/cold therapy and pain meds if needed.   1130: RN spoke with Shanda RT, O2 sats 86 % after coughing and recovering from coughing for a few minutes, O2 sats 85-86%.   1134: Vanessa RT in room with patient.   1841: Made Dr Ricky aware that patient is having constipation, has been getting fentanyl  prn

## 2024-02-24 NOTE — Progress Notes (Signed)
 Progress Note   Patient: [name] FMW:[id number] DOB: Jul 30, 1980 DOA: 02/22/2024     1 DOS: the patient was seen and examined on 02/24/2024   Brief hospital admission narrative: As per H&P written by Dr. Manfred on 02/23/2024 [name] is a 43 y.o. female with medical history significant of Lupus, Type 1 diabetes, cervical HSIL, and hypertension who was brought to the ED via EMS from local fire station endorsing few days of productive cough, increasing SOB this evening. Tried to drive here but had to stop at [facility] where she was reportedly hypoxic in the low 80s on room air. Given nebs, solumedrol and IVF and started on supplemental oxygen with some improvement.  Review of labs shows patient with elevated white count, elevated lactic acid level and multifocal pneumonia on xray consistent with sepsis. Blood cultures and ua collected tin ED. Elevated glucose with anion gap acidosis, BHA pending, loikely DKA   Assessment and plan 1-sepsis secondary to multifocal pneumonia - Continue IV antibiotics - Continue mucolytics, bronchodilator management and follow culture results - Given the negative MRSA vancomycin discontinued. - WBCs has started to trend down and patient is afebrile.  2-acute respiratory failure with hypoxia - In the setting of multifocal pneumonia most likely component of COPD/asthma - Images that has been secondary to worsening respiratory distress with concern for early ARDS - Patient in the requiring BiPAP overnight and he is on high flow nasal cannula supplementation at the moment. - Continue to minimize fluid resuscitation - Continue steroids, bronchodilators and Pulmicort management. - Start Brovana - Continue the use of incentive spirometer and flutter valve.  3-type I DKA - Resolved - Sliding scale insulin , long-acting and meal coverage has been initiated - Most recent A1c of 9.6 demonstrating uncontrolled diabetes. - Follow CBG fluctuation and adjust  hypoglycemia regimen as needed.  4-GERD/GI prophylaxis - Continue PPI.  5-acute kidney injury on chronic kidney disease stage III A at baseline. - Continue to maintain adequate hydration - Minimize nephrotoxic agents - Follow renal function trend  6-history of lupus - Holding immunosuppressive agents at the moment - Patient on chronic steroids; stress dosages currently satisfied with the use of his steroids for reactive airway disease/chronic obstructive pulmonary disease exacerbation.  7-overweight - Low-calorie diet and portion control discussed with patient -Body mass index is 29.29 kg/m.    Subjective:  Increased respiratory distress overnight requiring BiPAP; currently on high flow nasal cannula supplementation and demonstrating overall improvement in her respiratory status.  Continue to wean off oxygen supplementation as tolerated.  Goal is for saturation above 90-92%.  Physical Exam: Vitals:   02/24/24 1400 02/24/24 1445 02/24/24 1553 02/24/24 1609  BP: (!) 133/53     Pulse: 90     Resp: (!) 31     Temp:   98.2 F (36.8 C)   TempSrc:   Oral   SpO2: 92% 91%  91%  Weight:      Height:       General exam: Alert, awake, oriented x 3; in no major distress. Respiratory system: Positive rhonchi; at time of examination just mild expiratory wheezing appreciated.  Intermittent tachypnea with exertion appreciated.  No using accessory muscles. Cardiovascular system: Rate controlled, no rubs, no gallops, no JVD on exam. Gastrointestinal system: Abdomen is nondistended, soft and nontender. No organomegaly or masses felt. Normal bowel sounds heard. Central nervous system: Alert and oriented. No focal neurological deficits. Extremities: No cyanosis or clubbing; no edema. Skin: No petechiae. Psychiatry: Judgement and insight appear  normal. Mood & affect appropriate.    Data Reviewed: ABG: 7.3 2/34/220/17.5 Magnesium : 1.9 CBC: WBCs 17.4, hemoglobin 9.8 and platelet count  256 Comprehensive metabolic panel: Sodium 138, potassium 4.1, chloride 108, bicarb 17, glucose 167, BUN 26, creatinine 1.59 and GFR 40   Family Communication: Husband at bedside.  Disposition: Status is: Inpatient Remains inpatient appropriate because: Continue with therapy.  Anticipating discharge back home once medically stable.  Time spent: 50 minutes  Author: Eric Nunnery, MD 02/24/2024 4:26 PM  For on call review [email or web address].

## 2024-02-24 NOTE — Plan of Care (Signed)

## 2024-02-24 NOTE — Significant Event (Incomplete)
 CROSS COVER NOTE  NAME: [name] MRN: [id number] DOB : [birth year] ATTENDING PHYSICIAN: Ricky Fines, MD    Date of Service   02/23/2024   HPI/Events of Note   Patient with SEPSIS from multifocal pneumonia in setting of chonic lupus.  Likely ARDs on CT HPI see H & P by me early am CT and labs reviewed + rhinovirus, BC no growth x 12h Per review of chart breathing has progressively worsened throughout the day with increasing oxygen requirements Is now off insulin  drip but was unable to eat. Now nauseated with emesis   Interventions   Assessment/Plan:     02/23/2024    8:13 PM 02/23/2024    7:52 PM 02/23/2024    7:00 PM  Vitals with BMI  Systolic 127 142   Diastolic 78 59   Pulse 100 90 89   Resp rate 30 short shallow at rest HFNC 15 with NRB in place sats 94% Temp                                                                                   100.1 ax General:critically ill, slight lethargic, generalized edema especially noted in the face  Resp course rhonchi and crackles throughout. Reports lateral thoracic musculoskeletal pain from frequent coughing  Abd soft non tender - nausea improving after zofran    Multiple attempts for ABG unsuccessful VBG: 7.33 pCO2 32 HCO3 16.7; No significant change with comparison to this am ABG however her oxygen requirement work of breathing exponentially increased  Discussion with patient and husband likely need for intubation due to worsening and ARDS picture. Patient refused intubation, did agree to use BIPAP again and have abg reevaluated in am  Acetaminophen  1000 every 6 h prn fever mild pain Solu medrol 125 mg x 1 - high dose steroids needed secondary to lupus  Protonix  40 mg IV Q 24 h - effects of GERD seen on CT; current nausea + stress ulcer prophylaxis Stop scheduled and long acting insulin  until patietn can tolerate oral intake - high dose q 4 h SQ scale ordered Patient will need lupus  pulm reevaluation at discharge  if not intubated and done while hospitalized  Morphine  ineffective for pain, increase in cratinine with last bmp results- morphine  changed to fentanyl  for severe pain Try heated high flow when off BIPAP ABG and chest xray ordered for am CRITICAL CARE Performed by: Erminio Cone   Total critical care time: 30 minutes  Critical care time was exclusive of separately billable procedures and treating other patients.  Critical care was necessary to treat or prevent imminent or life-threatening deterioration.  Critical care was time spent personally by me on the following activities: development of treatment plan with patient and/or surrogate as well as nursing, discussions with consultants, evaluation of patient's response to treatment, examination of patient, obtaining history from patient or surrogate, ordering and performing treatments and interventions, ordering and review of laboratory studies, ordering and review of radiographic studies, pulse oximetry and re-evaluation of patient's condition.        Erminio LITTIE Cone NP [facility] Cross Cover 7pm-7am - check amion for availability Pager [phone]

## 2024-02-25 ENCOUNTER — Inpatient Hospital Stay (HOSPITAL_COMMUNITY)

## 2024-02-25 ENCOUNTER — Other Ambulatory Visit: Payer: Self-pay

## 2024-02-25 DIAGNOSIS — A419 Sepsis, unspecified organism: Secondary | ICD-10-CM | POA: Diagnosis not present

## 2024-02-25 DIAGNOSIS — J189 Pneumonia, unspecified organism: Secondary | ICD-10-CM | POA: Diagnosis not present

## 2024-02-25 DIAGNOSIS — E109 Type 1 diabetes mellitus without complications: Secondary | ICD-10-CM

## 2024-02-25 DIAGNOSIS — J9601 Acute respiratory failure with hypoxia: Secondary | ICD-10-CM | POA: Diagnosis present

## 2024-02-25 LAB — GLUCOSE, CAPILLARY
Glucose-Capillary: 151 mg/dL — ABNORMAL HIGH (ref 70–99)
Glucose-Capillary: 152 mg/dL — ABNORMAL HIGH (ref 70–99)
Glucose-Capillary: 154 mg/dL — ABNORMAL HIGH (ref 70–99)
Glucose-Capillary: 159 mg/dL — ABNORMAL HIGH (ref 70–99)
Glucose-Capillary: 166 mg/dL — ABNORMAL HIGH (ref 70–99)
Glucose-Capillary: 173 mg/dL — ABNORMAL HIGH (ref 70–99)

## 2024-02-25 LAB — CBC
HCT: 30.5 % — ABNORMAL LOW (ref 36.0–46.0)
Hemoglobin: 9.7 g/dL — ABNORMAL LOW (ref 12.0–15.0)
MCH: 27.8 pg (ref 26.0–34.0)
MCHC: 31.8 g/dL (ref 30.0–36.0)
MCV: 87.4 fL (ref 80.0–100.0)
Platelets: 290 K/uL (ref 150–400)
RBC: 3.49 MIL/uL — ABNORMAL LOW (ref 3.87–5.11)
RDW: 14.8 % (ref 11.5–15.5)
WBC: 22.1 K/uL — ABNORMAL HIGH (ref 4.0–10.5)
nRBC: 0 % (ref 0.0–0.2)

## 2024-02-25 LAB — BASIC METABOLIC PANEL WITH GFR
Anion gap: 13 (ref 5–15)
BUN: 36 mg/dL — ABNORMAL HIGH (ref 6–20)
CO2: 19 mmol/L — ABNORMAL LOW (ref 22–32)
Calcium: 8.6 mg/dL — ABNORMAL LOW (ref 8.9–10.3)
Chloride: 109 mmol/L (ref 98–111)
Creatinine, Ser: 1.33 mg/dL — ABNORMAL HIGH (ref 0.44–1.00)
GFR, Estimated: 50 mL/min — ABNORMAL LOW (ref >60)
Glucose, Bld: 173 mg/dL — ABNORMAL HIGH (ref 70–99)
Potassium: 4.1 mmol/L (ref 3.5–5.1)
Sodium: 141 mmol/L (ref 135–145)

## 2024-02-25 LAB — LACTIC ACID, PLASMA
Lactic Acid, Venous: 1.1 mmol/L (ref 0.5–1.9)
Lactic Acid, Venous: 1.2 mmol/L (ref 0.5–1.9)

## 2024-02-25 LAB — PRO BRAIN NATRIURETIC PEPTIDE: Pro Brain Natriuretic Peptide: 1058 pg/mL — ABNORMAL HIGH (ref <300.0)

## 2024-02-25 MED ORDER — ZINC SULFATE 220 (50 ZN) MG PO CAPS
220.0000 mg | ORAL_CAPSULE | Freq: Every day | ORAL | Status: DC
  Administered 2024-02-25 – 2024-03-02 (×7): 220 mg via ORAL
  Filled 2024-02-25 (×7): qty 1

## 2024-02-25 MED ORDER — HYDROMORPHONE HCL 1 MG/ML IJ SOLN
1.0000 mg | INTRAMUSCULAR | Status: DC | PRN
  Administered 2024-02-25 – 2024-03-02 (×26): 1 mg via INTRAVENOUS
  Filled 2024-02-25 (×26): qty 1

## 2024-02-25 MED ORDER — MELATONIN 3 MG PO TABS
3.0000 mg | ORAL_TABLET | Freq: Every evening | ORAL | Status: DC | PRN
  Administered 2024-02-25: 3 mg via ORAL
  Filled 2024-02-25: qty 1

## 2024-02-25 MED ORDER — SODIUM CHLORIDE 0.9% FLUSH: 10.0000 mL | INTRAVENOUS | Status: DC | PRN

## 2024-02-25 MED ORDER — IPRATROPIUM BROMIDE 0.02 % IN SOLN: 0.5000 mg | Freq: Four times a day (QID) | RESPIRATORY_TRACT | Status: DC

## 2024-02-25 MED ORDER — LEVALBUTEROL HCL 1.25 MG/0.5ML IN NEBU
1.2500 mg | INHALATION_SOLUTION | Freq: Four times a day (QID) | RESPIRATORY_TRACT | Status: DC
  Filled 2024-02-25 (×5): qty 0.5

## 2024-02-25 MED ORDER — IPRATROPIUM-ALBUTEROL 0.5-2.5 (3) MG/3ML IN SOLN
3.0000 mL | RESPIRATORY_TRACT | Status: DC
Start: 2024-02-25 — End: 2024-02-28
  Administered 2024-02-25 – 2024-02-28 (×17): 3 mL via RESPIRATORY_TRACT
  Filled 2024-02-25 (×16): qty 3

## 2024-02-25 MED ORDER — SODIUM CHLORIDE 0.9% FLUSH
10.0000 mL | Freq: Two times a day (BID) | INTRAVENOUS | Status: DC
  Administered 2024-02-25: 30 mL
  Administered 2024-02-26: 40 mL
  Administered 2024-02-26: 30 mL
  Administered 2024-02-27 (×2): 10 mL
  Administered 2024-02-28: 30 mL
  Administered 2024-02-28 – 2024-03-01 (×4): 10 mL
  Administered 2024-03-01: 30 mL
  Administered 2024-03-02: 10 mL

## 2024-02-25 MED ORDER — INSULIN GLARGINE-YFGN 100 UNIT/ML ~~LOC~~ SOLN
15.0000 [IU] | Freq: Every day | SUBCUTANEOUS | Status: DC
  Administered 2024-02-25 – 2024-02-26 (×2): 15 [IU] via SUBCUTANEOUS
  Filled 2024-02-25 (×3): qty 0.15

## 2024-02-25 MED ORDER — VITAMIN C 500 MG PO TABS
500.0000 mg | ORAL_TABLET | Freq: Every day | ORAL | Status: DC
  Administered 2024-02-25 – 2024-03-02 (×7): 500 mg via ORAL
  Filled 2024-02-25 (×7): qty 1

## 2024-02-25 MED ORDER — ACETAMINOPHEN 500 MG PO TABS
1000.0000 mg | ORAL_TABLET | Freq: Four times a day (QID) | ORAL | Status: DC | PRN
  Administered 2024-02-26: 1000 mg via ORAL
  Filled 2024-02-25 (×2): qty 2

## 2024-02-25 MED ORDER — FUROSEMIDE 10 MG/ML IJ SOLN
40.0000 mg | Freq: Two times a day (BID) | INTRAMUSCULAR | Status: DC
  Administered 2024-02-25 – 2024-02-27 (×5): 40 mg via INTRAVENOUS
  Filled 2024-02-25 (×5): qty 4

## 2024-02-25 NOTE — Consult Note (Signed)
 NAME:  [name], MRN:  [id number], DOB:  11-01-1980, LOS: 2 ADMISSION DATE:  02/22/2024 CHIEF COMPLAINT:  SOB.    History of Present Illness:  This is a telemetry consult.  Consulting physician that is me is that [facility].  The patient is currently at [facility].  She is 43 year old female with history of lupus on Plaquenil, type 1 diabetes and hypertension. Presented with shortness of breath productive cough with yellow phlegm which started last week.  She works in a nursing home.  She received nebs Solu-Medrol IVF and supplemental oxygen.  On presentation to the hospital at [facility] she was found to be in mild DKA.  Her DKA resolved and currently she is on subcu insulin .  She did receive fluids for her DKA.  She smokes 5 cigarettes a day.  She has family history of asthma but no personal history of asthma.  She was started on Solu-Medrol, Zosyn and received couple of doses of vancomycin.  She was started on scheduled nebs and as needed nebs.  In addition viral panel was positive for rhino/enterovirus.  CT chest was performed which showed apical predominant ground glass opacities bilaterally.  Differentials are viral pneumonia [more likely] versus pulmonary edema.  On talking to her over video she appears comfortable.  She says she was tried on BiPAP for last 2 days overnight.  She currently is not interested in going back on BiPAP.  She is feeling slightly better than before.  Interim History / Subjective:  See above.  Significant Hospital Events: 02/25/2024: PCCM consulted.  On high flow nasal cannula 85%.  Objective    Blood pressure 139/67, pulse 80, temperature 97.7 F (36.5 C), temperature source Oral, resp. rate (!) 25, height 5' 3 (1.6 m), weight 75 kg, last menstrual period 10/26/2022, SpO2 95%.    FiO2 (%):  [80 %-100 %] 85 % PEEP:  [6 cmH20] 6 cmH20   Intake/Output Summary (Last 24 hours) at 02/25/2024 1528 Last data filed at 02/25/2024  0700 Gross per 24 hour  Intake 147.7 ml  Output --  Net 147.7 ml   Filed Weights   02/22/24 2314  Weight: 75 kg    Examination: Physical examination could not be performed as this is a telemetry consult. Via video conference patient appears comfortable and breathing in mid 20s. reported wheezing on auscultation.  Assessment and Plan  57 female without history of COPD/asthma but family history of asthma, lupus on Plaquenil, diabetes and hypertension presents with dyspnea.  Acute hypoxic respiratory failure: Rhinovirus positive: Viral pneumonia: -Eosinophil before were negative.  Less likely to be AIP or AEP with history of lupus.  Differential mostly multifocal viral pneumonia exacerbated by pulmonary edema.  BNP elevated to 1000.  Likely has underlying asthma. - Continue with current antibiotics.  Agree with stopping vancomycin. - Solu-Medrol 40 mg IV twice daily. - Continue Brovana and steroid nebs. - Continue scheduled albuterol and as needed DuoNebs. - Diuresis. -Continue high flow nasal cannula.  BiPAP if becomes more dyspneic.  - ABG now, lactic acid now.  Echo now. - Outpatient PFTs.  Outpatient follow-up with pulmonary.  DKA: - Resolved. - Continue with subcu insulin  per primary team.  AKI versus CKD: - Monitor with diuresis.  Labs   CBC: Recent Labs  Lab 02/22/24 2348 02/23/24 0329 02/24/24 0528 02/25/24 0728  WBC 18.9* 19.2* 17.4* 22.1*  HGB 11.1* 10.5* 9.8* 9.7*  HCT 35.6* 32.6* 30.3* 30.5*  MCV 88.6 88.6 87.3 87.4  PLT 306 324 256 290    Basic Metabolic Panel: Recent Labs  Lab 02/23/24 0757 02/23/24 1235 02/23/24 1613 02/24/24 0528 02/25/24 0728  NA 132* 137 137 138 141  K 3.7 4.0 4.3 4.1 4.1  CL 102 105 105 108 109  CO2 15* 19* 20* 17* 19*  GLUCOSE 203* 82 192* 167* 173*  BUN 19 20 20  26* 36*  CREATININE 1.13* 1.18* 1.22* 1.59* 1.33*  CALCIUM  8.3* 8.5* 8.5* 8.3* 8.6*  MG  --   --   --  1.9  --   PHOS  --   --   --  3.8  --     GFR: Estimated Creatinine Clearance: 51.8 mL/min (A) (by C-G formula based on SCr of 1.33 mg/dL (H)). Recent Labs  Lab 02/22/24 2348 02/23/24 0329 02/23/24 0757 02/24/24 0528 02/25/24 0728  WBC 18.9* 19.2*  --  17.4* 22.1*  LATICACIDVEN 4.0* 4.4* 3.7*  --   --     Liver Function Tests: Recent Labs  Lab 02/24/24 0528  AST 41  ALT 7  ALKPHOS 79  BILITOT 0.2  PROT 5.3*  ALBUMIN 2.9*   No results for input(s): LIPASE, AMYLASE in the last 168 hours. No results for input(s): AMMONIA in the last 168 hours.  ABG    Component Value Date/Time   PHART 7.32 (L) 02/24/2024 0419   PCO2ART 34 02/24/2024 0419   PO2ART 220 (H) 02/24/2024 0419   HCO3 17.5 (L) 02/24/2024 0419   TCO2 25 05/10/2017 2033   ACIDBASEDEF 7.7 (H) 02/24/2024 0419   O2SAT 97.9 02/24/2024 0419      Past Medical History:  She,  has a past medical history of Diabetes mellitus without complication (HCC), Hypertension, and Lupus.   Surgical History:   Past Surgical History:  Procedure Laterality Date   CERVICAL CONIZATION W/BX N/A 09/26/2023   Procedure: CONE BIOPSY, CERVIX;  Surgeon: Jayne Vonn DEL, MD;  Location: [facility];  Service: Gynecology;  Laterality: N/A;   SHOULDER SURGERY Left    UTERINE FIBROID SURGERY     WRIST SURGERY Right      Social History:   reports that she has been smoking cigarettes. She uses smokeless tobacco. She reports that she does not drink alcohol and does not use drugs.   Family History:  Her family history is not on file.   Allergies Allergies  Allergen Reactions   Strawberry Extract Dermatitis    Pt states she was allergic as a child but not so far as an adult.     Home Medications  Prior to Admission medications   Medication Sig Start Date End Date Taking? Authorizing Provider  amLODipine (NORVASC) 2.5 MG tablet Take 2.5 mg by mouth daily. 08/18/23   [provider]  amoxicillin-clavulanate (AUGMENTIN) 875-125 MG tablet Take 1 tablet by mouth every  12 (twelve) hours. 02/08/24   Towana Ozell BROCKS, MD  aspirin EC 81 MG tablet Take 81 mg by mouth daily. Swallow whole.    [provider]  Belimumab  (BENLYSTA  IV) Inject into the vein.    [provider]  HUMALOG 100 UNIT/ML injection Inject 100 Units into the skin daily. Via insulin  pump 01/29/24   [provider]  HYDROcodone -acetaminophen  (NORCO/VICODIN) 5-325 MG tablet Take 1 tablet by mouth every 6 (six) hours as needed. 09/26/23   Jayne Vonn DEL, MD  hydroxychloroquine (PLAQUENIL) 200 MG tablet Take 200 mg by mouth 2 (two) times daily.    [provider]  ketorolac  (TORADOL ) 10 MG  tablet Take 1 tablet (10 mg total) by mouth every 8 (eight) hours as needed. 09/26/23   Jayne Vonn DEL, MD  Lancets (ACCU-CHEK MULTICLIX) lancets Use as instructed 08/01/14   Dhungel, Nishant, MD  lisinopril  (ZESTRIL ) 20 MG tablet Take 20 mg by mouth daily.    [provider]  LORazepam  (ATIVAN ) 0.5 MG tablet Take 0.5 mg by mouth 2 (two) times daily.    [provider]  medroxyPROGESTERone  (PROVERA ) 10 MG tablet Take 1 tablet (10 mg total) by mouth daily. Patient not taking: Reported on 12/18/2023 08/03/23   Jayne Vonn DEL, MD  mycophenolate (CELLCEPT) 500 MG tablet Take 1,500 mg by mouth 2 (two) times daily.    [provider]  nicotine  (NICODERM CQ  - DOSED IN MG/24 HOURS) 14 mg/24hr patch Place 1 patch (14 mg total) onto the skin daily. 12/18/23   Mannam, Praveen, MD  ondansetron  (ZOFRAN ) 4 MG tablet Take 1 tablet (4 mg total) by mouth every 6 (six) hours as needed for nausea. Patient not taking: Reported on 12/18/2023 05/14/17   Cheryle Page, MD  ondansetron  (ZOFRAN -ODT) 8 MG disintegrating tablet Take 1 tablet (8 mg total) by mouth every 8 (eight) hours as needed for nausea or vomiting. 02/08/24   Towana Ozell BROCKS, MD  oxyCODONE-acetaminophen  (PERCOCET/ROXICET) 5-325 MG tablet Take 1 tablet by mouth every 6 (six) hours as needed for severe pain (pain score  7-10). 02/08/24   Towana Ozell BROCKS, MD  pantoprazole  (PROTONIX ) 40 MG tablet Take 1 tablet (40 mg total) by mouth daily. Patient not taking: Reported on 12/18/2023 05/14/17   Cheryle Page, MD  predniSONE (DELTASONE) 10 MG tablet Take 15 mg by mouth daily with breakfast.    [provider]  pregabalin (LYRICA) 150 MG capsule Take 150 mg by mouth 3 (three) times daily.    [provider]  promethazine  (PHENERGAN ) 25 MG tablet Take 1 tablet (25 mg total) by mouth every 6 (six) hours as needed for nausea or vomiting. Patient not taking: Reported on 12/18/2023 04/24/18   Norris Will PARAS, PA-C  rosuvastatin (CRESTOR) 10 MG tablet Take 10 mg by mouth daily.    [provider]  sertraline  (ZOLOFT ) 100 MG tablet Take 200 mg by mouth daily.    [provider]  Varenicline  Tartrate, Starter, (CHANTIX  STARTING MONTH PAK) 0.5 MG X 11 & 1 MG X 42 TBPK Take 1 tablet by mouth daily. 12/18/23   Mannam, Praveen, MD     CRITICAL CARE Performed by: Sammi JONETTA Fredericks.     Total critical care time: 40 minutes   Critical care time was exclusive of separately billable procedures and treating other patients.   Critical care was necessary to treat or prevent imminent or life-threatening deterioration.   Critical care was time spent personally by me on the following activities: development of treatment plan with patient and/or surrogate as well as nursing, discussions with consultants, evaluation of patient's response to treatment, examination of patient, obtaining history from patient or surrogate, ordering and performing treatments and interventions, ordering and review of laboratory studies, ordering and review of radiographic studies, pulse oximetry, re-evaluation of patient's condition and participation in multidisciplinary rounds.  Sammi JONETTA Fredericks, MD Pulmonary, Critical Care and Sleep Attending.  Pager: [phone]  02/25/2024, 3:28 PM

## 2024-02-25 NOTE — Progress Notes (Signed)
 Peripherally Inserted Central Catheter Placement  The IV Nurse has discussed with the patient and/or persons authorized to consent for the patient, the purpose of this procedure and the potential benefits and risks involved with this procedure.  The benefits include less needle sticks, lab draws from the catheter, and the patient may be discharged home with the catheter. Risks include, but not limited to, infection, bleeding, blood clot (thrombus formation), and puncture of an artery; nerve damage and irregular heartbeat and possibility to perform a PICC exchange if needed/ordered by physician.  Alternatives to this procedure were also discussed.  Bard Power PICC patient education guide, fact sheet on infection prevention and patient information card has been provided to patient /or left at bedside.    PICC Placement Documentation  PICC Triple Lumen 02/25/24 Left Brachial 42 cm 0 cm (Active)  Indication for Insertion or Continuance of Line Limited venous access - need for IV therapy >5 days (PICC only) 02/25/24 1610  Exposed Catheter (cm) 0 cm 02/25/24 1610  Site Assessment Clean, Dry, Intact 02/25/24 1610  Lumen #1 Status Saline locked;Flushed;Blood return noted 02/25/24 1610  Lumen #2 Status Flushed;Saline locked;Blood return noted 02/25/24 1610  Lumen #3 Status Flushed;Saline locked;Blood return noted 02/25/24 1610  Dressing Type Transparent 02/25/24 1610  Dressing Status Antimicrobial disc/dressing in place;Clean, Dry, Intact 02/25/24 1610  Line Care Lumen 1 tubing changed;Lumen 1 cap changed;Lumen 2 cap changed;Lumen 3 cap changed;Connections checked and tightened 02/25/24 1610  Dressing Change Due 03/03/24 02/25/24 1610       Malynda Smolinski CHRISTELLA Bohr 02/25/2024, 4:35 PM

## 2024-02-25 NOTE — Inpatient Diabetes Management (Signed)
 Inpatient Diabetes Program Recommendations  AACE/ADA: New Consensus Statement on Inpatient Glycemic Control   Target Ranges:  Prepandial:   less than 140 mg/dL      Peak postprandial:   less than 180 mg/dL (1-2 hours)      Critically ill patients:  140 - 180 mg/dL    Latest Reference Range & Units 02/24/24 07:14 02/24/24 11:06 02/24/24 15:52 02/24/24 19:18 02/24/24 23:23 02/25/24 03:26 02/25/24 07:00  Glucose-Capillary 70 - 99 mg/dL 829 (H)  Novolog  4 units 165 (H)  Novolog  4 units 224 (H)  Novolog  7 units 156 (H)  Novolog  4 units 144 (H)  Novolog  3 units 151 (H)  Novolog  4 units 166 (H)  Novolog  4 units    Review of Glycemic Control  Diabetes history: DM1 Outpatient Diabetes medications: Insulin  Pump with Humalog Current orders for Inpatient glycemic control: Novolog  0-20 units Q4H; Solumedrol 40 mg Q12H  Inpatient Diabetes Program Recommendations:    Insulin : Over the past 24 hours, patient has received a total of Novolog  30 units for correction.  Please consider ordering Semglee  15 units Q24H (to start now) and decrease Novolog  correction to 0-9 units Q4H.  Thanks, Earnie Gainer, RN, MSN, CDCES Diabetes Coordinator Inpatient Diabetes Program [phone] (Team Pager from 8am to 5pm)

## 2024-02-25 NOTE — Progress Notes (Signed)
 eLink Physician-Brief Progress Note Patient Name: TRIANA COOVER DOB: Nov 15, 1980 MRN: [id number]   Date of Service  02/25/2024  HPI/Events of Note  Patient is on HHFNC 20L/80% Asking for AM ABG in the morning  Received request for Melatonin for sleep  Patient has PRN for pain scale 1-3 (Tylenol ) and scale 7-10 (Dilaudid ) but nothing for in between like pain scale 4-6  Patient seen on camera not in apparent distress  eICU Interventions  AM ABG ordered to facilitate weaning off HHFNC Melatonin prn ordered for insomnia Moderate pain scale added to the indication for Tylenol      Intervention Category Intermediate Interventions: Other: Minor Interventions: Routine modifications to care plan (e.g. PRN medications for pain, fever)  Damien DASEN Mattelyn Imhoff 02/25/2024, 8:31 PM

## 2024-02-25 NOTE — Progress Notes (Signed)
 PROGRESS NOTE    Patient: [name]                            PCP: Patient, No Pcp Per                    DOB: Nov 14, 1980            DOA: 02/22/2024 FMW:[id number]             DOS: 02/25/2024, 12:32 PM   LOS: 2 days   Date of Service: The patient was seen and examined on 02/25/2024  Subjective:   The patient was seen and examined this morning. Patient remained awake alert, following command, on high flow oxygen by nasal cannula, with O2 flow 20 L/min, FiO2 85%--- satting 95%, tachypneic with respiratory rate of 27.   Brief Narrative:   [name] is a 43 y.o. female with medical history significant of Lupus, Type 1 diabetes, cervical HSIL, and hypertension who was brought to the ED via EMS from local fire station endorsing few days of productive cough, increasing SOB this evening. Tried to drive here but had to stop at [facility] where she was reportedly hypoxic in the low 80s on room air. Given nebs, solumedrol and IVF and started on supplemental oxygen with some improvement.  Review of labs shows patient with elevated white count, elevated lactic acid level and multifocal pneumonia on xray consistent with sepsis. Blood cultures and ua collected tin ED. Elevated glucose with anion gap acidosis, BHA pending, loikely DKA   Assessment & Plan:   Principal Problem:   Sepsis due to pneumonia (HCC) Acute respiratory failure    Sepsis secondary to multifocal pneumonia -Viral panel positive for rhinovirus, enterovirus - Continue IV antibiotics, IV steroids, respiratory support, via heated high flow oxygen - Continue mucolytics, bronchodilator management and follow culture results - Given the negative MRSA vancomycin discontinued. - Worsening leukocytosis on steroids   Acute respiratory failure with hypoxia - In the setting of multifocal pneumonia most likely component of COPD/asthma/rhinovirus enterovirus - 10/19/ Cx-ray consistent with diffuse infusion, consistent with  multifocal pneumonia - Patient in the requiring BiPAP overnight and ahe is on high flow nasal cannula supplementation at the moment.  Rate of 20, FiO2 of 85%, satting 95% - Initiating Lasix - Continue steroids, bronchodilators and Pulmicort management. - Start Brovana - Continue the use of incentive spirometer and flutter valve.   Type I DKA - Resolved -monitoring hyperglycemic state due to steroids -Adding long-acting insulin  Semglee  15 units today -Continue checking CBG q. ACHS, SSI coverage - Home insulin  pump-on hold - Most recent A1c of 9.6 demonstrating uncontrolled diabetes.   GERD/GI prophylaxis - Continue PPI.   acute kidney injury on chronic kidney disease stage III A at baseline. - Continue to maintain adequate hydration - Minimize nephrotoxic agents - Follow renal function trend   History of lupus - Holding immunosuppressive agents at the moment - Patient on chronic steroids; stress dosages currently satisfied with the use of his steroids for reactive airway disease/chronic obstructive pulmonary disease exacerbation.   Overweight - Low-calorie diet and portion control discussed with patient -Body mass index is 29.29 kg/m     ----------------------------------------------------------------------------------------------------------------------------------------------- Nutritional status:  The patient's BMI is: Body mass index is 29.29 kg/m. I agree with the assessment and plan as outlined  Discussed regarding healthy exercise, healthy diet, diabetic diet Weight loss programs     ------------------------------------------------------------------------------------------------------------------------------------------------- Cultures; Respiratory panel  positive for rhinovirus/   ------------------------------------------------------------------------------------------------------------------------------------------------  DVT prophylaxis:  heparin  injection  5,000 Units Start: 02/23/24 0600 SCDs Start: 02/23/24 0506   Code Status:   Code Status: Full Code  Family Communication: Husband present at bedside updated -Advance care planning has been discussed.   Admission status:   Status is: Inpatient Remains inpatient appropriate because: Needing respiratory support, IV steroids, nebs,   Disposition: From  - home             Planning for discharge in 1-2 days   Procedures:   No admission procedures for hospital encounter.   Antimicrobials:  Anti-infectives (From admission, onward)    Start     Dose/Rate Route Frequency Ordered Stop   02/23/24 2200  vancomycin (VANCOCIN) IVPB 1000 mg/200 mL premix  Status:  Discontinued        1,000 mg 200 mL/hr over 60 Minutes Intravenous Every 24 hours 02/23/24 0414 02/24/24 1425   02/23/24 0330  piperacillin-tazobactam (ZOSYN) IVPB 3.375 g        3.375 g 12.5 mL/hr over 240 Minutes Intravenous Every 8 hours 02/23/24 0307     02/23/24 0315  vancomycin (VANCOREADY) IVPB 1500 mg/300 mL        1,500 mg 150 mL/hr over 120 Minutes Intravenous  Once 02/23/24 0307 02/23/24 0620   02/23/24 0015  cefTRIAXone  (ROCEPHIN ) 2 g in sodium chloride  0.9 % 100 mL IVPB        2 g 200 mL/hr over 30 Minutes Intravenous Once 02/23/24 0014 02/23/24 0144   02/23/24 0015  azithromycin  (ZITHROMAX ) 500 mg in sodium chloride  0.9 % 250 mL IVPB  Status:  Discontinued        500 mg 250 mL/hr over 60 Minutes Intravenous  Once 02/23/24 0014 02/23/24 0409        Medication:   arformoterol  15 mcg Nebulization BID   ascorbic acid  500 mg Oral Daily   benzonatate  200 mg Oral TID   budesonide (PULMICORT) nebulizer solution  0.5 mg Nebulization BID   Chlorhexidine  Gluconate Cloth  6 each Topical Daily   dextromethorphan-guaiFENesin  1 tablet Oral BID   docusate sodium  100 mg Oral BID   furosemide  40 mg Intravenous Q12H   heparin   5,000 Units Subcutaneous Q8H   insulin  aspart  0-20 Units Subcutaneous Q4H   insulin   glargine-yfgn  15 Units Subcutaneous Daily   ipratropium  0.5 mg Nebulization Q6H   levalbuterol  1.25 mg Nebulization Q6H   methylPREDNISolone (SOLU-MEDROL) injection  40 mg Intravenous Q12H   pantoprazole  (PROTONIX ) IV  40 mg Intravenous Q24H   polyethylene glycol  17 g Oral Daily   zinc sulfate (50mg  elemental zinc)  220 mg Oral Daily    acetaminophen , bisacodyl, chlorpheniramine-HYDROcodone , dextrose , diphenhydrAMINE , guaiFENesin-dextromethorphan, HYDROmorphone  (DILAUDID ) injection, ipratropium-albuterol, ondansetron  (ZOFRAN ) IV, mouth rinse, prochlorperazine   Objective:   Vitals:   02/25/24 0918 02/25/24 1049 02/25/24 1100 02/25/24 1155  BP: 134/64 (!) 126/59    Pulse: 90 79  77  Resp: (!) 23 20  (!) 27  Temp:    97.7 F (36.5 C)  TempSrc:    Oral  SpO2: 97% 96% 99% 95%  Weight:      Height:        Intake/Output Summary (Last 24 hours) at 02/25/2024 1232 Last data filed at 02/25/2024 0700 Gross per 24 hour  Intake 147.7 ml  Output --  Net 147.7 ml   Filed Weights   02/22/24 2314  Weight: 75 kg  Physical examination:        General:  AAO x 3,  cooperative, no distress;   HEENT:  Normocephalic, PERRL, otherwise with in Normal limits   Neuro:  CNII-XII intact. , normal motor and sensation, reflexes intact   Lungs:   Shortness of breath at rest, on high flow oxygen via nasal cannula, diffuse wheezing, rhonchi, bilateral lower lobe crackles  Cardio:    S1/S2, RRR, No murmure, No Rubs or Gallops   Abdomen:  Soft, non-tender, bowel sounds active all four quadrants, no guarding or peritoneal signs.  Muscular  skeletal:  Limited exam -global generalized weaknesses - in bed, able to move all 4 extremities,   2+ pulses,  symmetric, No pitting edema  Skin:  Dry, warm to touch, negative for any Rashes,  Wounds: Please see nursing documentation           ------------------------------------------------------------------------------------------------------------------------------------------    LABs:     Latest Ref Rng & Units 02/25/2024    7:28 AM 02/24/2024    5:28 AM 02/23/2024    3:29 AM  CBC  WBC 4.0 - 10.5 K/uL 22.1  17.4  19.2   Hemoglobin 12.0 - 15.0 g/dL 9.7  9.8  89.4   Hematocrit 36.0 - 46.0 % 30.5  30.3  32.6   Platelets 150 - 400 K/uL 290  256  324       Latest Ref Rng & Units 02/25/2024    7:28 AM 02/24/2024    5:28 AM 02/23/2024    4:13 PM  CMP  Glucose 70 - 99 mg/dL 826  832  807   BUN 6 - 20 mg/dL 36  26  20   Creatinine 0.44 - 1.00 mg/dL 8.66  8.40  8.77   Sodium 135 - 145 mmol/L 141  138  137   Potassium 3.5 - 5.1 mmol/L 4.1  4.1  4.3   Chloride 98 - 111 mmol/L 109  108  105   CO2 22 - 32 mmol/L 19  17  20    Calcium  8.9 - 10.3 mg/dL 8.6  8.3  8.5   Total Protein 6.5 - 8.1 g/dL  5.3    Total Bilirubin 0.0 - 1.2 mg/dL  0.2    Alkaline Phos 38 - 126 U/L  79    AST 15 - 41 U/L  41    ALT 0 - 44 U/L  7         Micro Results Recent Results (from the past 240 hours)  Culture, blood (routine x 2)     Status: None (Preliminary result)   Collection Time: 02/23/24 12:53 AM   Specimen: Left Antecubital; Blood  Result Value Ref Range Status   Specimen Description LEFT ANTECUBITAL  Final   Special Requests   Final    BOTTLES DRAWN AEROBIC AND ANAEROBIC Blood Culture adequate volume   Culture   Final    NO GROWTH 2 DAYS Performed at [facility], [address], [place], [place] [address]    Report Status PENDING  Incomplete  Culture, blood (routine x 2)     Status: None (Preliminary result)   Collection Time: 02/23/24 12:57 AM   Specimen: Left Antecubital; Blood  Result Value Ref Range Status   Specimen Description LEFT ANTECUBITAL  Final   Special Requests   Final    BOTTLES DRAWN AEROBIC AND ANAEROBIC Blood Culture adequate volume   Culture   Final    NO GROWTH 2 DAYS Performed at [facility], [address] [place], [place] [address]    Report Status PENDING  Incomplete  MRSA Next Gen by PCR, Nasal     Status: None   Collection Time: 02/23/24  3:04 AM   Specimen: Nasal Mucosa; Nasal Swab  Result Value Ref Range Status   MRSA by PCR Next Gen NOT DETECTED NOT DETECTED Final    Comment: (NOTE) The GeneXpert MRSA Assay (FDA approved for NASAL specimens only), is one component of a comprehensive MRSA colonization surveillance program. It is not intended to diagnose MRSA infection nor to guide or monitor treatment for MRSA infections. Test performance is not FDA approved in patients less than 28 years old. Performed at [facility], [address] [place], [place] [address]   Respiratory (~20 pathogens) panel by PCR     Status: Abnormal   Collection Time: 02/23/24  6:54 AM   Specimen: Nasopharyngeal Swab; Respiratory  Result Value Ref Range Status   Adenovirus NOT DETECTED NOT DETECTED Final   Coronavirus 229E NOT DETECTED NOT DETECTED Final    Comment: (NOTE) The Coronavirus on the Respiratory Panel, DOES NOT test for the novel  Coronavirus (2019 nCoV)    Coronavirus HKU1 NOT DETECTED NOT DETECTED Final   Coronavirus NL63 NOT DETECTED NOT DETECTED Final   Coronavirus OC43 NOT DETECTED NOT DETECTED Final   Metapneumovirus NOT DETECTED NOT DETECTED Final   Rhinovirus / Enterovirus DETECTED (A) NOT DETECTED Final   Influenza A NOT DETECTED NOT DETECTED Final   Influenza B NOT DETECTED NOT DETECTED Final   Parainfluenza Virus 1 NOT DETECTED NOT DETECTED Final   Parainfluenza Virus 2 NOT DETECTED NOT DETECTED Final   Parainfluenza Virus 3 NOT DETECTED NOT DETECTED Final   Parainfluenza Virus 4 NOT DETECTED NOT DETECTED Final   Respiratory Syncytial Virus NOT DETECTED NOT DETECTED Final   Bordetella pertussis NOT DETECTED NOT DETECTED Final   Bordetella Parapertussis NOT DETECTED NOT DETECTED Final   Chlamydophila pneumoniae NOT DETECTED NOT DETECTED Final   Mycoplasma  pneumoniae NOT DETECTED NOT DETECTED Final    Comment: Performed at [facility], [address], [place], [place] [address]    Radiology Reports No results found.  SIGNED: Adriana DELENA Grams, MD, FHM. FAAFP. Jol[facility] - [facility] Critical care time spent - 55 min.  In seeing, evaluating and examining the patient. Reviewing medical records, labs, drawn plan of care. [facility],  Pager (please use [email or web address] to page/ text) Please use Epic Secure Chat for non-urgent communication (7AM-7PM)  If 7PM-7AM, please contact night-coverage [email or web address], 02/25/2024, 12:32 PM

## 2024-02-26 ENCOUNTER — Inpatient Hospital Stay (HOSPITAL_COMMUNITY)

## 2024-02-26 DIAGNOSIS — A419 Sepsis, unspecified organism: Secondary | ICD-10-CM | POA: Diagnosis not present

## 2024-02-26 DIAGNOSIS — R0609 Other forms of dyspnea: Secondary | ICD-10-CM | POA: Diagnosis not present

## 2024-02-26 DIAGNOSIS — J189 Pneumonia, unspecified organism: Secondary | ICD-10-CM | POA: Diagnosis not present

## 2024-02-26 LAB — COMPREHENSIVE METABOLIC PANEL WITH GFR
ALT: 10 U/L (ref 0–44)
AST: 19 U/L (ref 15–41)
Albumin: 3.3 g/dL — ABNORMAL LOW (ref 3.5–5.0)
Alkaline Phosphatase: 88 U/L (ref 38–126)
Anion gap: 16 — ABNORMAL HIGH (ref 5–15)
BUN: 45 mg/dL — ABNORMAL HIGH (ref 6–20)
CO2: 22 mmol/L (ref 22–32)
Calcium: 8.8 mg/dL — ABNORMAL LOW (ref 8.9–10.3)
Chloride: 104 mmol/L (ref 98–111)
Creatinine, Ser: 1.48 mg/dL — ABNORMAL HIGH (ref 0.44–1.00)
GFR, Estimated: 44 mL/min — ABNORMAL LOW (ref >60)
Glucose, Bld: 171 mg/dL — ABNORMAL HIGH (ref 70–99)
Potassium: 3.6 mmol/L (ref 3.5–5.1)
Sodium: 142 mmol/L (ref 135–145)
Total Bilirubin: 0.2 mg/dL (ref 0.0–1.2)
Total Protein: 6.1 g/dL — ABNORMAL LOW (ref 6.5–8.1)

## 2024-02-26 LAB — ECHOCARDIOGRAM COMPLETE
Area-P 1/2: 4.31 cm2
Height: 63 in
MV M vel: 3.56 m/s
MV Peak grad: 50.6 mmHg
S' Lateral: 2.7 cm
Weight: 2645.52 [oz_av]

## 2024-02-26 LAB — CBC WITH DIFFERENTIAL/PLATELET
Abs Immature Granulocytes: 0.08 K/uL — ABNORMAL HIGH (ref 0.00–0.07)
Basophils Absolute: 0 K/uL (ref 0.0–0.1)
Basophils Relative: 0 %
Eosinophils Absolute: 0 K/uL (ref 0.0–0.5)
Eosinophils Relative: 0 %
HCT: 29.7 % — ABNORMAL LOW (ref 36.0–46.0)
Hemoglobin: 9.4 g/dL — ABNORMAL LOW (ref 12.0–15.0)
Immature Granulocytes: 1 %
Lymphocytes Relative: 4 %
Lymphs Abs: 0.6 K/uL — ABNORMAL LOW (ref 0.7–4.0)
MCH: 27.6 pg (ref 26.0–34.0)
MCHC: 31.6 g/dL (ref 30.0–36.0)
MCV: 87.4 fL (ref 80.0–100.0)
Monocytes Absolute: 0.6 K/uL (ref 0.1–1.0)
Monocytes Relative: 3 %
Neutro Abs: 15.8 K/uL — ABNORMAL HIGH (ref 1.7–7.7)
Neutrophils Relative %: 92 %
Platelets: 282 K/uL (ref 150–400)
RBC: 3.4 MIL/uL — ABNORMAL LOW (ref 3.87–5.11)
RDW: 14.9 % (ref 11.5–15.5)
WBC: 17.1 K/uL — ABNORMAL HIGH (ref 4.0–10.5)
nRBC: 0 % (ref 0.0–0.2)

## 2024-02-26 LAB — BLOOD GAS, ARTERIAL
Acid-base deficit: 3.6 mmol/L — ABNORMAL HIGH (ref 0.0–2.0)
Bicarbonate: 21.5 mmol/L (ref 20.0–28.0)
Drawn by: 22223
O2 Saturation: 94.7 %
Patient temperature: 37
pCO2 arterial: 38 mmHg (ref 32–48)
pH, Arterial: 7.36 (ref 7.35–7.45)
pO2, Arterial: 72 mmHg — ABNORMAL LOW (ref 83–108)

## 2024-02-26 LAB — PRO BRAIN NATRIURETIC PEPTIDE: Pro Brain Natriuretic Peptide: 942 pg/mL — ABNORMAL HIGH (ref <300.0)

## 2024-02-26 LAB — GLUCOSE, CAPILLARY
Glucose-Capillary: 108 mg/dL — ABNORMAL HIGH (ref 70–99)
Glucose-Capillary: 166 mg/dL — ABNORMAL HIGH (ref 70–99)
Glucose-Capillary: 198 mg/dL — ABNORMAL HIGH (ref 70–99)
Glucose-Capillary: 92 mg/dL (ref 70–99)
Glucose-Capillary: 91 mg/dL (ref 70–99)
Glucose-Capillary: 170 mg/dL — ABNORMAL HIGH (ref 70–99)

## 2024-02-26 MED ORDER — INSULIN GLARGINE-YFGN 100 UNIT/ML ~~LOC~~ SOLN
18.0000 [IU] | Freq: Every day | SUBCUTANEOUS | Status: DC
  Administered 2024-02-27: 18 [IU] via SUBCUTANEOUS
  Filled 2024-02-26 (×2): qty 0.18

## 2024-02-26 NOTE — Plan of Care (Signed)
  Problem: Clinical Measurements: Goal: Ability to maintain clinical measurements within normal limits will improve Outcome: Progressing Goal: Will remain free from infection Outcome: Progressing Goal: Diagnostic test results will improve Outcome: Progressing Goal: Respiratory complications will improve Outcome: Progressing Goal: Cardiovascular complication will be avoided Outcome: Progressing   Problem: Coping: Goal: Level of anxiety will decrease Outcome: Progressing   Problem: Elimination: Goal: Will not experience complications related to bowel motility Outcome: Progressing Goal: Will not experience complications related to urinary retention Outcome: Progressing   Problem: Pain Managment: Goal: General experience of comfort will improve and/or be controlled Outcome: Progressing

## 2024-02-26 NOTE — Evaluation (Signed)
 Physical Therapy Evaluation Patient Details Name: [name] MRN: [id number] DOB: [birth year] Today's Date: 02/26/2024  History of Present Illness  [name] is a 43 y.o. female with medical history significant of Lupus, Type 1 diabetes, cervical HSIL, and hypertension who was brought to the ED via EMS from local fire station endorsing few days of productive cough, increasing SOB this evening. Tried to drive here but had to stop at [facility] where she was reportedly hypoxic in the low 80s on room air. Given nebs, solumedrol and IVF and started on supplemental oxygen with some improvement.   Review of labs shows patient with elevated white count, elevated lactic acid level and multifocal pneumonia on xray consistent with sepsis. Blood cultures and ua collected tin ED. Elevated glucose with anion gap acidosis, BHA pending, loikely DKA   Clinical Impression  Pt. Presented with generalized weakness, due to pneumonia. Pt.has HHFNC at 20, pt was able to get out of bed to ambulate backwards and forwards but felt fatigued, pt. Doesn't not use AD. Nurse was notified on pt. Status. Patient will benefit from continued skilled physical therapy in hospital and recommended venue below to increase strength, balance, endurance for safe ADLs and gait.       If plan is discharge home, recommend the following: Assist for transportation;A little help with walking and/or transfers   Can travel by private vehicle    Yes    Equipment Recommendations None recommended by PT  Recommendations for Other Services       Functional Status Assessment Patient has had a recent decline in their functional status and demonstrates the ability to make significant improvements in function in a reasonable and predictable amount of time.     Precautions / Restrictions Precautions Precautions: Fall Recall of Precautions/Restrictions: Intact Precaution/Restrictions Comments: Moderate Fall risk Restrictions Weight Bearing  Restrictions Per Provider Order: No      Mobility  Bed Mobility Overal bed mobility: Independent             General bed mobility comments: Pt. can't lay flat on bed, causes chest discomfort    Transfers Overall transfer level: Independent Equipment used: None               General transfer comment: No AD    Ambulation/Gait Ambulation/Gait assistance: Independent Gait Distance (Feet): 15 Feet Assistive device: None Gait Pattern/deviations: Step-through pattern, Decreased step length - right, Decreased step length - left, Decreased stance time - right, Narrow base of support, Decreased stride length       General Gait Details: Pt. was able to ambulate in the room with HHFNC but felt fatigued.  Stairs            Wheelchair Mobility     Tilt Bed    Modified Rankin (Stroke Patients Only)       Balance Overall balance assessment: Independent                                           Pertinent Vitals/Pain Pain Assessment Pain Assessment: No/denies pain    Home Living Family/patient expects to be discharged to:: Private residence Living Arrangements: Spouse/significant other Available Help at Discharge: Family;Friend(s);Available PRN/intermittently Type of Home: House Home Access: Stairs to enter Entrance Stairs-Rails: Can reach both Entrance Stairs-Number of Steps: 1   Home Layout: One level   Additional Comments: No AD    Prior  Function Prior Level of Function : Independent/Modified Independent;Working/employed             Mobility Comments: Pt is a Engineer, civil (consulting) ADLs Comments: can do ADLs independently     Extremity/Trunk Assessment        Lower Extremity Assessment Lower Extremity Assessment: Overall WFL for tasks assessed    Cervical / Trunk Assessment Cervical / Trunk Assessment: Normal  Communication   Communication Communication: No apparent difficulties    Cognition Arousal: Alert Behavior During  Therapy: WFL for tasks assessed/performed   PT - Cognitive impairments: No apparent impairments                         Following commands: Intact       Cueing Cueing Techniques: Verbal cues     General Comments      Exercises     Assessment/Plan    PT Assessment Patient needs continued PT services  PT Problem List Decreased strength;Decreased range of motion;Decreased activity tolerance;Decreased balance;Decreased mobility;Cardiopulmonary status limiting activity       PT Treatment Interventions DME instruction;Gait training;Stair training;Functional mobility training;Therapeutic activities;Therapeutic exercise;Balance training;Patient/family education    PT Goals (Current goals can be found in the Care Plan section)  Acute Rehab PT Goals Patient Stated Goal: Pt. wants to return home with family and spouse. PT Goal Formulation: With patient Time For Goal Achievement: 03/04/24 Potential to Achieve Goals: Good    Frequency Min 3X/week     Co-evaluation               AM-PAC PT 6 Clicks Mobility  Outcome Measure Help needed turning from your back to your side while in a flat bed without using bedrails?: A Little Help needed moving from lying on your back to sitting on the side of a flat bed without using bedrails?: A Little Help needed moving to and from a bed to a chair (including a wheelchair)?: None Help needed standing up from a chair using your arms (e.g., wheelchair or bedside chair)?: None Help needed to walk in hospital room?: A Little Help needed climbing 3-5 steps with a railing? : A Little 6 Click Score: 20    End of Session   Activity Tolerance: Patient tolerated treatment well Patient left: in bed;with family/visitor present Nurse Communication: Mobility status PT Visit Diagnosis: Muscle weakness (generalized) (M62.81);Other (comment);History of falling (Z91.81) (diffcluty breathing)    Time: 8982-8971 PT Time Calculation (min)  (ACUTE ONLY): 11 min   Charges:   PT Evaluation $PT Eval Low Complexity: 1 Low PT Treatments $Therapeutic Activity: 8-22 mins PT General Charges $$ ACUTE PT VISIT: 1 Visit        Ivery Cable, SPT

## 2024-02-26 NOTE — Plan of Care (Signed)
  Problem: Acute Rehab PT Goals(only PT should resolve) Goal: Pt Will Go Supine/Side To Sit Outcome: Progressing Flowsheets (Taken 02/26/2024 1228) Pt will go Supine/Side to Sit: Independently Note: Pt. Is able to bed mobility on flat bed Goal: Pt Will Perform Standing Balance Or Pre-Gait Outcome: Progressing Flowsheets (Taken 02/26/2024 1228) Pt will perform standing balance or pre-gait: Independently Note: Pt. Will be able to ambulate longer distances in the room with HHFNC Goal: Pt Will Ambulate Outcome: Progressing Flowsheets (Taken 02/26/2024 1228) Pt will Ambulate:  Independently  with modified independence  100 feet  75 feet Note: Pt. Will be able ambulate outside of the room. Monitoring SpO2  Goal: Pt Will Go Up/Down Stairs Outcome: Progressing Flowsheets (Taken 02/26/2024 1228) Pt will Go Up / Down Stairs:  1-2 stairs  with supervision  Tryphena Perkovich, SPT

## 2024-02-26 NOTE — TOC Progression Note (Signed)
 Transition of Care [facility]) - Progression Note    Patient Details  Name: [name] MRN: [id number] Date of Birth: 08-Dec-1980  [facility] CM/SW Contact  Sharlyne Stabs, RN Phone Number: 02/26/2024, 2:07 PM  Clinical Narrative:   PT is recommending HHPT, Patient has commercial insurance and will have a copay per visit. IPCM following to see if patient wants to go to outpatient PT, Work up continues, discharge planning for Friday.    Expected Discharge Plan: Home/Self Care Barriers to Discharge: Continued Medical Work up      Social Drivers of Health (SDOH) Interventions SDOH Screenings   Food Insecurity: No Food Insecurity (02/23/2024)  Housing: Low Risk  (02/23/2024)  Transportation Needs: No Transportation Needs (02/23/2024)  Utilities: Not At Risk (02/23/2024)  Alcohol Screen: Low Risk  (07/16/2023)  Depression (PHQ2-9): Low Risk  (07/16/2023)  Financial Resource Strain: Low Risk  (07/16/2023)  Physical Activity: Inactive (07/16/2023)  Social Connections: Moderately Isolated (07/16/2023)  Stress: No Stress Concern Present (07/16/2023)  Tobacco Use: High Risk (02/22/2024)    Readmission Risk Interventions     No data to display

## 2024-02-26 NOTE — Inpatient Diabetes Management (Signed)
 Inpatient Diabetes Program Recommendations  AACE/ADA: New Consensus Statement on Inpatient Glycemic Control   Target Ranges:  Prepandial:   less than 140 mg/dL      Peak postprandial:   less than 180 mg/dL (1-2 hours)      Critically ill patients:  140 - 180 mg/dL    Latest Reference Range & Units 02/25/24 07:00 02/25/24 11:16 02/25/24 16:11 02/25/24 19:32 02/25/24 23:30 02/26/24 03:31 02/26/24 07:19  Glucose-Capillary 70 - 99 mg/dL 833 (H) 840 (H) 845 (H) 152 (H) 173 (H) 108 (H) 166 (H)   Review of Glycemic Control  Diabetes history: DM1 Outpatient Diabetes medications: Insulin  Pump with Humalog Current orders for Inpatient glycemic control: Semglee  15 units daily, Novolog  0-20 units Q4H; Solumedrol 40 mg Q12H  Inpatient Diabetes Program Recommendations:     NOTE: Spoke with patient and husband at bedside regarding diabetes and home regimen for diabetes management.  Patient states that she sees Dr. Balan for diabetes management. Patient uses a T-Slim insulin  pump with Humalog insulin  as an outpatient. Patient has insulin  pump in room but not wearing it at this time. Patient states she has extra insulin  pump supplies at the hospital so she could resume using her insulin  pump prior to discharge. Reviewed insulin  pump settings which are as follows:    Basal insulin   12A 0.95 units/hour 3A 0.95 units/hour 5A 1.0 units/hour 11A 1.0 units/hour 1:30P  1.05 units/hour 6:30P  1.05 units/hour 8P  0.95 units/hour 10P  0.95 units/hour Total daily basal insulin : 23.875 units/24 hours  Carb Coverage 12A    1:15   1 unit for every 15 grams of carbohydrates 3A      1:15   1 unit for every 15 grams of carbohydrates 5A      1:15   1 unit for every 15 grams of carbohydrates 11A     1:15   1 unit for every 15 grams of carbohydrates 1:30P  1:15   1 unit for every 15 grams of carbohydrates 6:30P  1:12   1 unit for every 12 grams of carbohydrates 8P       1:12   1 unit for every 12 grams of  carbohydrates 10P     1:15   1 unit for every 15 grams of carbohydrates  Insulin  Sensitivity 12A    1:50 1 unit drops blood glucose 50 mg/dl 3 A     8:54 1 unit drops blood glucose 45 mg/dl 5A      8:54 1 unit drops blood glucose 45 mg/dl 88J     8:49 1 unit drops blood glucose 50 mg/dl 8:69E  8:49 1 unit drops blood glucose 50 mg/dl 3:69E  8:59 1 unit drops blood glucose 40 mg/dl 8P       8:64 1 unit drops blood glucose 35 mg/dl 89E     8:54 1 unit drops blood glucose 45 mg/dl  Target Glucose Goals 889 mg/dl  In talking with the patient she states that her blood glucose normally runs very good and she uses Control IQ on her insulin  pump.   Patient agreeable to using SQ insulin  while inpatient. Discussed Semglee  duration and advised patient that on the morning of when she will be discharged, Semglee  should not be given so patient can resume her insulin  pump at discharge.   Patient verbalized understanding of information discussed and states that she does not have any further questions related to diabetes at this time.  Thanks, Earnie Gainer, RN, MSN, CDC[facility] Diabetes Coordinator Inpatient  Diabetes Program [phone] (Team Pager from 8am to 5pm)

## 2024-02-26 NOTE — Progress Notes (Signed)
 NAME:  [name], MRN:  [id number], DOB:  10/11/1980, LOS: 3 ADMISSION DATE:  02/22/2024 CHIEF COMPLAINT:  SOB.    History of Present Illness:  This is a telemetry consult.  Consulting physician that is me is that [facility].  The patient is currently at [facility].  She is 43 year old female with history of lupus on Plaquenil, type 1 diabetes and hypertension. Presented with shortness of breath productive cough with yellow phlegm which started last week.  She works in a nursing home.  She received nebs Solu-Medrol IVF and supplemental oxygen.  On presentation to the hospital at [facility] she was found to be in mild DKA.  Her DKA resolved and currently she is on subcu insulin .  She did receive fluids for her DKA.  She smokes 5 cigarettes a day.  She has family history of asthma but no personal history of asthma.  She was started on Solu-Medrol, Zosyn and received couple of doses of vancomycin.  She was started on scheduled nebs and as needed nebs.  In addition viral panel was positive for rhino/enterovirus.  CT chest was performed which showed apical predominant ground glass opacities bilaterally.  Differentials are viral pneumonia [more likely] versus pulmonary edema.  On talking to her over video she appears comfortable.  She says she was tried on BiPAP for last 2 days overnight.  She currently is not interested in going back on BiPAP.  She is feeling slightly better than before.  Interim History / Subjective:  Briefly logged into patient's camera after getting an okay from the nurse.  Patient was getting her head dried and combed with the help of nurses.  She looked comfortable.  I was not able to talk to her.  Significant Hospital Events: 02/25/2024: PCCM consulted.  On high flow nasal cannula 85%.  Objective    Blood pressure (!) 146/67, pulse 66, temperature 98 F (36.7 C), resp. rate (!) 25, height 5' 3 (1.6 m), weight 75 kg, last menstrual period  10/26/2022, SpO2 97%.    FiO2 (%):  [80 %-88 %] 80 %   Intake/Output Summary (Last 24 hours) at 02/26/2024 1222 Last data filed at 02/26/2024 0917 Gross per 24 hour  Intake 114.86 ml  Output 900 ml  Net -785.14 ml   Filed Weights   02/22/24 2314  Weight: 75 kg    Examination: Physical examination could not be performed as this is a telemetry consult.  Assessment and Plan  29 female without history of COPD/asthma but family history of asthma, lupus on Plaquenil, diabetes and hypertension presents with dyspnea.  Acute hypoxic respiratory failure: Rhinovirus positive: Viral pneumonia: -Eosinophil before were negative.  Less likely to be AIP or AEP with history of lupus.  Differential mostly multifocal viral pneumonia exacerbated by pulmonary edema.  BNP elevated to 1000.  Likely has underlying asthma. - Continue with current antibiotics.  Status post vancomycin.- Solu-Medrol 40 mg IV twice daily. - Continue Brovana and steroid nebs. - Continue scheduled duonebs and as needed DuoNebs. If no wheezing can reduce frequency of shceduled duonebs to q6h.  - Diuresis per primary team.  Creatinine noted to be 1.48.  Okay with continued diuresis if still appears to be fluid overloaded.  Will defer physical exam to primary team since I am not able to physically see the patient. -Continue high flow nasal cannula.  BiPAP if becomes more dyspneic.  - Lactic acidosis resolved.  ABG reviewed. - Echo pending.  - Outpatient PFTs.  Outpatient follow-up with pulmonary.  DKA: - Resolved. - Continue with subcu insulin  per primary team.  AKI versus CKD: - Monitor with diuresis.  Labs   CBC: Recent Labs  Lab 02/22/24 2348 02/23/24 0329 02/24/24 0528 02/25/24 0728 02/26/24 0835  WBC 18.9* 19.2* 17.4* 22.1* 17.1*  NEUTROABS  --   --   --   --  15.8*  HGB 11.1* 10.5* 9.8* 9.7* 9.4*  HCT 35.6* 32.6* 30.3* 30.5* 29.7*  MCV 88.6 88.6 87.3 87.4 87.4  PLT 306 324 256 290 282    Basic  Metabolic Panel: Recent Labs  Lab 02/23/24 1235 02/23/24 1613 02/24/24 0528 02/25/24 0728 02/26/24 0835  NA 137 137 138 141 142  K 4.0 4.3 4.1 4.1 3.6  CL 105 105 108 109 104  CO2 19* 20* 17* 19* 22  GLUCOSE 82 192* 167* 173* 171*  BUN 20 20 26* 36* 45*  CREATININE 1.18* 1.22* 1.59* 1.33* 1.48*  CALCIUM  8.5* 8.5* 8.3* 8.6* 8.8*  MG  --   --  1.9  --   --   PHOS  --   --  3.8  --   --    GFR: Estimated Creatinine Clearance: 46.5 mL/min (A) (by C-G formula based on SCr of 1.48 mg/dL (H)). Recent Labs  Lab 02/23/24 0329 02/23/24 0757 02/24/24 0528 02/25/24 0728 02/25/24 1708 02/25/24 1940 02/26/24 0835  WBC 19.2*  --  17.4* 22.1*  --   --  17.1*  LATICACIDVEN 4.4* 3.7*  --   --  1.1 1.2  --     Liver Function Tests: Recent Labs  Lab 02/24/24 0528 02/26/24 0835  AST 41 19  ALT 7 10  ALKPHOS 79 88  BILITOT 0.2 0.2  PROT 5.3* 6.1*  ALBUMIN 2.9* 3.3*   No results for input(s): LIPASE, AMYLASE in the last 168 hours. No results for input(s): AMMONIA in the last 168 hours.  ABG    Component Value Date/Time   PHART 7.36 02/26/2024 0424   PCO2ART 38 02/26/2024 0424   PO2ART 72 (L) 02/26/2024 0424   HCO3 21.5 02/26/2024 0424   TCO2 25 05/10/2017 2033   ACIDBASEDEF 3.6 (H) 02/26/2024 0424   O2SAT 94.7 02/26/2024 0424      Past Medical History:  She,  has a past medical history of Diabetes mellitus without complication (HCC), Hypertension, and Lupus.   Surgical History:   Past Surgical History:  Procedure Laterality Date   CERVICAL CONIZATION W/BX N/A 09/26/2023   Procedure: CONE BIOPSY, CERVIX;  Surgeon: Jayne Vonn DEL, MD;  Location: [facility];  Service: Gynecology;  Laterality: N/A;   SHOULDER SURGERY Left    UTERINE FIBROID SURGERY     WRIST SURGERY Right      Social History:   reports that she has been smoking cigarettes. She uses smokeless tobacco. She reports that she does not drink alcohol and does not use drugs.   Family History:  Her family  history is not on file.   Allergies Allergies  Allergen Reactions   Strawberry Extract Dermatitis    Pt states she was allergic as a child but not so far as an adult.     Home Medications  Prior to Admission medications   Medication Sig Start Date End Date Taking? Authorizing Provider  amLODipine (NORVASC) 2.5 MG tablet Take 2.5 mg by mouth daily. 08/18/23   [provider]  amoxicillin-clavulanate (AUGMENTIN) 875-125 MG tablet Take 1 tablet by mouth every 12 (twelve) hours. 02/08/24   Towana,  Michael C, MD  aspirin EC 81 MG tablet Take 81 mg by mouth daily. Swallow whole.    [provider]  Belimumab  (BENLYSTA  IV) Inject into the vein.    [provider]  HUMALOG 100 UNIT/ML injection Inject 100 Units into the skin daily. Via insulin  pump 01/29/24   [provider]  HYDROcodone -acetaminophen  (NORCO/VICODIN) 5-325 MG tablet Take 1 tablet by mouth every 6 (six) hours as needed. 09/26/23   Jayne Vonn DEL, MD  hydroxychloroquine (PLAQUENIL) 200 MG tablet Take 200 mg by mouth 2 (two) times daily.    [provider]  ketorolac  (TORADOL ) 10 MG tablet Take 1 tablet (10 mg total) by mouth every 8 (eight) hours as needed. 09/26/23   Jayne Vonn DEL, MD  Lancets (ACCU-CHEK MULTICLIX) lancets Use as instructed 08/01/14   Dhungel, Nishant, MD  lisinopril  (ZESTRIL ) 20 MG tablet Take 20 mg by mouth daily.    [provider]  LORazepam  (ATIVAN ) 0.5 MG tablet Take 0.5 mg by mouth 2 (two) times daily.    [provider]  medroxyPROGESTERone  (PROVERA ) 10 MG tablet Take 1 tablet (10 mg total) by mouth daily. Patient not taking: Reported on 12/18/2023 08/03/23   Jayne Vonn DEL, MD  mycophenolate (CELLCEPT) 500 MG tablet Take 1,500 mg by mouth 2 (two) times daily.    [provider]  nicotine  (NICODERM CQ  - DOSED IN MG/24 HOURS) 14 mg/24hr patch Place 1 patch (14 mg total) onto the skin daily. 12/18/23   Mannam, Praveen, MD  ondansetron  (ZOFRAN ) 4  MG tablet Take 1 tablet (4 mg total) by mouth every 6 (six) hours as needed for nausea. Patient not taking: Reported on 12/18/2023 05/14/17   Cheryle Page, MD  ondansetron  (ZOFRAN -ODT) 8 MG disintegrating tablet Take 1 tablet (8 mg total) by mouth every 8 (eight) hours as needed for nausea or vomiting. 02/08/24   Towana Ozell BROCKS, MD  oxyCODONE-acetaminophen  (PERCOCET/ROXICET) 5-325 MG tablet Take 1 tablet by mouth every 6 (six) hours as needed for severe pain (pain score 7-10). 02/08/24   Towana Ozell BROCKS, MD  pantoprazole  (PROTONIX ) 40 MG tablet Take 1 tablet (40 mg total) by mouth daily. Patient not taking: Reported on 12/18/2023 05/14/17   Cheryle Page, MD  predniSONE (DELTASONE) 10 MG tablet Take 15 mg by mouth daily with breakfast.    [provider]  pregabalin (LYRICA) 150 MG capsule Take 150 mg by mouth 3 (three) times daily.    [provider]  promethazine  (PHENERGAN ) 25 MG tablet Take 1 tablet (25 mg total) by mouth every 6 (six) hours as needed for nausea or vomiting. Patient not taking: Reported on 12/18/2023 04/24/18   Norris Will PARAS, PA-C  rosuvastatin (CRESTOR) 10 MG tablet Take 10 mg by mouth daily.    [provider]  sertraline  (ZOLOFT ) 100 MG tablet Take 200 mg by mouth daily.    [provider]  Varenicline  Tartrate, Starter, (CHANTIX  STARTING MONTH PAK) 0.5 MG X 11 & 1 MG X 42 TBPK Take 1 tablet by mouth daily. 12/18/23   Mannam, Praveen, MD     CRITICAL CARE Performed by: Sammi JONETTA Fredericks.     Total critical care time: 31 minutes   Critical care time was exclusive of separately billable procedures and treating other patients.   Critical care was necessary to treat or prevent imminent or life-threatening deterioration.   Critical care was time spent personally by me on the following activities: development of treatment plan with patient and/or surrogate  as well as nursing, discussions with consultants, evaluation of patient's response  to treatment, examination of patient, obtaining history from patient or surrogate, ordering and performing treatments and interventions, ordering and review of laboratory studies, ordering and review of radiographic studies, pulse oximetry, re-evaluation of patient's condition and participation in multidisciplinary rounds.  Sammi JONETTA Fredericks, MD Pulmonary, Critical Care and Sleep Attending.  Pager: [phone]  02/26/2024, 12:29 PM

## 2024-02-26 NOTE — Progress Notes (Signed)
  Echocardiogram 2D Echocardiogram has been performed.  Koleen KANDICE Popper, RDCS 02/26/2024, 11:34 AM

## 2024-02-26 NOTE — Progress Notes (Signed)
 PROGRESS NOTE    Patient: [name]                            PCP: Patient, No Pcp Per                    DOB: 10/01/1980            DOA: 02/22/2024 FMW:[id number]             DOS: 02/26/2024, 11:30 AM   LOS: 3 days   Date of Service: The patient was seen and examined on 02/26/2024  Subjective:   The patient was seen and examined this morning, still in respite distress, tachypneic with respiratory rate of 25, satting 97% on heated high flow oxygen via nasal cannula rate of 20, FiO2 of 80%   Brief Narrative:   [name] is a 43 y.o. female with medical history significant of Lupus, Type 1 diabetes, cervical HSIL, and hypertension who was brought to the ED via EMS from local fire station endorsing few days of productive cough, increasing SOB this evening. Tried to drive here but had to stop at [facility] where she was reportedly hypoxic in the low 80s on room air. Given nebs, solumedrol and IVF and started on supplemental oxygen with some improvement.  Review of labs shows patient with elevated white count, elevated lactic acid level and multifocal pneumonia on xray consistent with sepsis. Blood cultures and ua collected tin ED. Elevated glucose with anion gap acidosis, BHA pending, loikely DKA   Assessment & Plan:   Principal Problem:   Sepsis due to pneumonia [facility]) Active Problems:   Diabetic ketoacidosis without coma associated with type 1 diabetes mellitus (HCC)   Multifocal pneumonia   Acute respiratory failure with hypoxia (HCC) Acute respiratory failure    Sepsis secondary to multifocal pneumonia -Delirium acute respiratory failure, -Viral panel positive for rhinovirus, enterovirus - Continue IV antibiotics, IV steroids, respiratory support, via heated high flow oxygen - Continue mucolytics, bronchodilator management and follow culture results - Given the negative MRSA vancomycin discontinued. - Worsening leukocytosis on steroids   Acute respiratory failure  with hypoxia -Currently satting 97%, on heated high flow oxygen 20 L, FiO2 of 80% - In the setting of multifocal pneumonia most likely component of COPD/asthma/rhinovirus enterovirus -Continue IV steroids, antibiotics, nebs, Pulmicort - 10/19/ Cx-ray consistent with diffuse infusion, consistent with multifocal pneumonia -IV Lasix - Start Brovana - Continue the use of incentive spirometer and flutter valve. -Pulmonary/critical care team consulted, recommending continuing high flow heated oxygen by nasal cannula, if condition worsens BiPAP    Type I DKA - Resolved -monitoring hyperglycemic state due to steroids -Adding long-acting insulin  Semglee  15 units today CBG (last 3)  Recent Labs    02/25/24 2330 02/26/24 0331 02/26/24 0719  GLUCAP 173* 108* 166*   -Continue checking CBG q. ACHS, SSI coverage - Home insulin  pump-on hold - Most recent A1c of 9.6 demonstrating uncontrolled diabetes.   GERD/GI prophylaxis - Continue PPI.   Acute kidney injury on chronic kidney disease stage III A at baseline. - Continue to maintain adequate hydration - Minimize nephrotoxic agents - Follow renal function trend Lab Results  Component Value Date   CREATININE 1.48 (H) 02/26/2024   CREATININE 1.33 (H) 02/25/2024   CREATININE 1.59 (H) 02/24/2024      History of lupus - Holding immunosuppressive agents at the moment - Patient on chronic steroids; stress dosages currently satisfied  with the use of his steroids for reactive airway disease/chronic obstructive pulmonary disease exacerbation.   Obesity-overweight - Low-calorie diet and portion control discussed with patient -Body mass index is 29.29 kg/m     ----------------------------------------------------------------------------------------------------------------------------------------------- Nutritional status:  The patient's BMI is: Body mass index is 29.29 kg/m. I agree with the assessment and plan as outlined  Discussed  regarding healthy exercise, healthy diet, diabetic diet Weight loss programs     ------------------------------------------------------------------------------------------------------------------------------------------------- Cultures; Respiratory panel positive for rhinovirus/   ------------------------------------------------------------------------------------------------------------------------------------------------  DVT prophylaxis:  heparin  injection 5,000 Units Start: 02/23/24 0600 SCDs Start: 02/23/24 0506   Code Status:   Code Status: Full Code  Family Communication: Husband present at bedside updated -Advance care planning has been discussed.   Admission status:   Status is: Inpatient Remains inpatient appropriate because: Needing respiratory support, IV steroids, nebs,   Disposition: From  - home             Planning for discharge in 2- 3 days   Procedures:   No admission procedures for hospital encounter.   Antimicrobials:  Anti-infectives (From admission, onward)    Start     Dose/Rate Route Frequency Ordered Stop   02/23/24 2200  vancomycin (VANCOCIN) IVPB 1000 mg/200 mL premix  Status:  Discontinued        1,000 mg 200 mL/hr over 60 Minutes Intravenous Every 24 hours 02/23/24 0414 02/24/24 1425   02/23/24 0330  piperacillin-tazobactam (ZOSYN) IVPB 3.375 g        3.375 g 12.5 mL/hr over 240 Minutes Intravenous Every 8 hours 02/23/24 0307     02/23/24 0315  vancomycin (VANCOREADY) IVPB 1500 mg/300 mL        1,500 mg 150 mL/hr over 120 Minutes Intravenous  Once 02/23/24 0307 02/23/24 0620   02/23/24 0015  cefTRIAXone  (ROCEPHIN ) 2 g in sodium chloride  0.9 % 100 mL IVPB        2 g 200 mL/hr over 30 Minutes Intravenous Once 02/23/24 0014 02/23/24 0144   02/23/24 0015  azithromycin  (ZITHROMAX ) 500 mg in sodium chloride  0.9 % 250 mL IVPB  Status:  Discontinued        500 mg 250 mL/hr over 60 Minutes Intravenous  Once 02/23/24 0014 02/23/24 0409         Medication:   arformoterol  15 mcg Nebulization BID   ascorbic acid  500 mg Oral Daily   benzonatate  200 mg Oral TID   budesonide (PULMICORT) nebulizer solution  0.5 mg Nebulization BID   Chlorhexidine  Gluconate Cloth  6 each Topical Daily   dextromethorphan-guaiFENesin  1 tablet Oral BID   docusate sodium  100 mg Oral BID   furosemide  40 mg Intravenous Q12H   heparin   5,000 Units Subcutaneous Q8H   insulin  aspart  0-20 Units Subcutaneous Q4H   insulin  glargine-yfgn  15 Units Subcutaneous Daily   ipratropium-albuterol  3 mL Nebulization Q4H   methylPREDNISolone (SOLU-MEDROL) injection  40 mg Intravenous Q12H   pantoprazole  (PROTONIX ) IV  40 mg Intravenous Q24H   polyethylene glycol  17 g Oral Daily   sodium chloride  flush  10-40 mL Intracatheter Q12H   zinc sulfate (50mg  elemental zinc)  220 mg Oral Daily    acetaminophen , bisacodyl, chlorpheniramine-HYDROcodone , dextrose , diphenhydrAMINE , guaiFENesin-dextromethorphan, HYDROmorphone  (DILAUDID ) injection, melatonin, ondansetron  (ZOFRAN ) IV, mouth rinse, prochlorperazine, sodium chloride  flush   Objective:   Vitals:   02/26/24 0800 02/26/24 0835 02/26/24 1000 02/26/24 1100  BP: (!) 146/67     Pulse: 71 71 67 66  Resp: 20 16 (!) 21 (!) 25  Temp: 98 F (36.7 C)     TempSrc: Oral     SpO2: 91% 92% 98% 97%  Weight:      Height:        Intake/Output Summary (Last 24 hours) at 02/26/2024 1130 Last data filed at 02/26/2024 9082 Gross per 24 hour  Intake 114.86 ml  Output 900 ml  Net -785.14 ml   Filed Weights   02/22/24 2314  Weight: 75 kg     Physical examination:    General:  AAO x 3,  cooperative, in mild-moderate distress with shortness of breath  HEENT:  Normocephalic, PERRL, otherwise with in Normal limits   Neuro:  CNII-XII intact. , normal motor and sensation, reflexes intact   Lungs:   Diffuse rhonchi, positive crackles right mid to lower lobe, mild labored respiratory efforts, minimum use of  accessory muscles  Cardio:    S1/S2, RRR, No murmure, No Rubs or Gallops   Abdomen:  Soft, non-tender, bowel sounds active all four quadrants, no guarding or peritoneal signs.  Muscular  skeletal:  Limited exam -global generalized weaknesses - in bed, able to move all 4 extremities,   2+ pulses,  symmetric, No pitting edema  Skin:  Dry, warm to touch, negative for any Rashes,  Wounds: Please see nursing documentation     --------------------------------------------------------------------------------------------------------------------------    LABs:     Latest Ref Rng & Units 02/26/2024    8:35 AM 02/25/2024    7:28 AM 02/24/2024    5:28 AM  CBC  WBC 4.0 - 10.5 K/uL 17.1  22.1  17.4   Hemoglobin 12.0 - 15.0 g/dL 9.4  9.7  9.8   Hematocrit 36.0 - 46.0 % 29.7  30.5  30.3   Platelets 150 - 400 K/uL 282  290  256       Latest Ref Rng & Units 02/26/2024    8:35 AM 02/25/2024    7:28 AM 02/24/2024    5:28 AM  CMP  Glucose 70 - 99 mg/dL 828  826  832   BUN 6 - 20 mg/dL 45  36  26   Creatinine 0.44 - 1.00 mg/dL 8.51  8.66  8.40   Sodium 135 - 145 mmol/L 142  141  138   Potassium 3.5 - 5.1 mmol/L 3.6  4.1  4.1   Chloride 98 - 111 mmol/L 104  109  108   CO2 22 - 32 mmol/L 22  19  17    Calcium  8.9 - 10.3 mg/dL 8.8  8.6  8.3   Total Protein 6.5 - 8.1 g/dL 6.1   5.3   Total Bilirubin 0.0 - 1.2 mg/dL 0.2   0.2   Alkaline Phos 38 - 126 U/L 88   79   AST 15 - 41 U/L 19   41   ALT 0 - 44 U/L 10   7        Micro Results Recent Results (from the past 240 hours)  Culture, blood (routine x 2)     Status: None (Preliminary result)   Collection Time: 02/23/24 12:53 AM   Specimen: Left Antecubital; Blood  Result Value Ref Range Status   Specimen Description LEFT ANTECUBITAL  Final   Special Requests   Final    BOTTLES DRAWN AEROBIC AND ANAEROBIC Blood Culture adequate volume   Culture   Final    NO GROWTH 3 DAYS Performed at [facility], [address], [place], [place]  [address]  Report Status PENDING  Incomplete  Culture, blood (routine x 2)     Status: None (Preliminary result)   Collection Time: 02/23/24 12:57 AM   Specimen: Left Antecubital; Blood  Result Value Ref Range Status   Specimen Description LEFT ANTECUBITAL  Final   Special Requests   Final    BOTTLES DRAWN AEROBIC AND ANAEROBIC Blood Culture adequate volume   Culture   Final    NO GROWTH 3 DAYS Performed at [facility], [address], [place], [place] [address]    Report Status PENDING  Incomplete  MRSA Next Gen by PCR, Nasal     Status: None   Collection Time: 02/23/24  3:04 AM   Specimen: Nasal Mucosa; Nasal Swab  Result Value Ref Range Status   MRSA by PCR Next Gen NOT DETECTED NOT DETECTED Final    Comment: (NOTE) The GeneXpert MRSA Assay (FDA approved for NASAL specimens only), is one component of a comprehensive MRSA colonization surveillance program. It is not intended to diagnose MRSA infection nor to guide or monitor treatment for MRSA infections. Test performance is not FDA approved in patients less than 81 years old. Performed at [facility], [address], [place], [place] [address]   Respiratory (~20 pathogens) panel by PCR     Status: Abnormal   Collection Time: 02/23/24  6:54 AM   Specimen: Nasopharyngeal Swab; Respiratory  Result Value Ref Range Status   Adenovirus NOT DETECTED NOT DETECTED Final   Coronavirus 229E NOT DETECTED NOT DETECTED Final    Comment: (NOTE) The Coronavirus on the Respiratory Panel, DOES NOT test for the novel  Coronavirus (2019 nCoV)    Coronavirus HKU1 NOT DETECTED NOT DETECTED Final   Coronavirus NL63 NOT DETECTED NOT DETECTED Final   Coronavirus OC43 NOT DETECTED NOT DETECTED Final   Metapneumovirus NOT DETECTED NOT DETECTED Final   Rhinovirus / Enterovirus DETECTED (A) NOT DETECTED Final   Influenza A NOT DETECTED NOT DETECTED Final   Influenza B NOT DETECTED NOT DETECTED Final   Parainfluenza Virus 1 NOT DETECTED NOT DETECTED  Final   Parainfluenza Virus 2 NOT DETECTED NOT DETECTED Final   Parainfluenza Virus 3 NOT DETECTED NOT DETECTED Final   Parainfluenza Virus 4 NOT DETECTED NOT DETECTED Final   Respiratory Syncytial Virus NOT DETECTED NOT DETECTED Final   Bordetella pertussis NOT DETECTED NOT DETECTED Final   Bordetella Parapertussis NOT DETECTED NOT DETECTED Final   Chlamydophila pneumoniae NOT DETECTED NOT DETECTED Final   Mycoplasma pneumoniae NOT DETECTED NOT DETECTED Final    Comment: Performed at [facility], [address], [place], [place] [address]    Radiology Reports DG CHEST PORT 1 VIEW Result Date: 02/25/2024 CLINICAL DATA:  Shortness of breath. EXAM: PORTABLE CHEST 1 VIEW COMPARISON:  Radiograph yesterday, CT 10/18/20255 FINDINGS: Normal heart size and mediastinal contours. Slight improvement in heterogeneous bilateral lung opacities. More confluent opacities at the lung bases are again seen. Suggest developing small pleural effusions. No pneumothorax. IMPRESSION: 1. Slight improvement in heterogeneous bilateral lung opacities. More confluent opacities at the lung bases are again seen. 2. Suggest developing small pleural effusions. Electronically Signed   By: Andrea Gasman M.D.   On: 02/25/2024 15:43   US  EKG SITE RITE Result Date: 02/25/2024 If Site Rite image not attached, placement could not be confirmed due to current cardiac rhythm.   SIGNED: Adriana DELENA Grams, MD, FHM. FAAFP. [facility] - [facility] Critical care time spent - 55 min.  In seeing, evaluating and  examining the patient. Reviewing medical records, labs, drawn plan of care. [facility],  Pager (please use [email or web address] to page/ text) Please use Epic Secure Chat for non-urgent communication (7AM-7PM)  If 7PM-7AM, please contact night-coverage [email or web address], 02/26/2024, 11:30 AM

## 2024-02-27 DIAGNOSIS — R918 Other nonspecific abnormal finding of lung field: Secondary | ICD-10-CM

## 2024-02-27 DIAGNOSIS — A419 Sepsis, unspecified organism: Secondary | ICD-10-CM | POA: Diagnosis not present

## 2024-02-27 DIAGNOSIS — J189 Pneumonia, unspecified organism: Secondary | ICD-10-CM | POA: Diagnosis not present

## 2024-02-27 LAB — GLUCOSE, CAPILLARY
Glucose-Capillary: 106 mg/dL — ABNORMAL HIGH (ref 70–99)
Glucose-Capillary: 131 mg/dL — ABNORMAL HIGH (ref 70–99)
Glucose-Capillary: 147 mg/dL — ABNORMAL HIGH (ref 70–99)
Glucose-Capillary: 186 mg/dL — ABNORMAL HIGH (ref 70–99)
Glucose-Capillary: 189 mg/dL — ABNORMAL HIGH (ref 70–99)
Glucose-Capillary: 298 mg/dL — ABNORMAL HIGH (ref 70–99)
Glucose-Capillary: 322 mg/dL — ABNORMAL HIGH (ref 70–99)

## 2024-02-27 LAB — CBC WITH DIFFERENTIAL/PLATELET
Abs Immature Granulocytes: 0.12 K/uL — ABNORMAL HIGH (ref 0.00–0.07)
Basophils Absolute: 0 K/uL (ref 0.0–0.1)
Basophils Relative: 0 %
Eosinophils Absolute: 0 K/uL (ref 0.0–0.5)
Eosinophils Relative: 0 %
HCT: 30.6 % — ABNORMAL LOW (ref 36.0–46.0)
Hemoglobin: 9.7 g/dL — ABNORMAL LOW (ref 12.0–15.0)
Immature Granulocytes: 1 %
Lymphocytes Relative: 5 %
Lymphs Abs: 0.7 K/uL (ref 0.7–4.0)
MCH: 27.2 pg (ref 26.0–34.0)
MCHC: 31.7 g/dL (ref 30.0–36.0)
MCV: 85.7 fL (ref 80.0–100.0)
Monocytes Absolute: 0.4 K/uL (ref 0.1–1.0)
Monocytes Relative: 3 %
Neutro Abs: 11.9 K/uL — ABNORMAL HIGH (ref 1.7–7.7)
Neutrophils Relative %: 91 %
Platelets: 291 K/uL (ref 150–400)
RBC: 3.57 MIL/uL — ABNORMAL LOW (ref 3.87–5.11)
RDW: 14.6 % (ref 11.5–15.5)
WBC: 13.1 K/uL — ABNORMAL HIGH (ref 4.0–10.5)
nRBC: 0 % (ref 0.0–0.2)

## 2024-02-27 LAB — COMPREHENSIVE METABOLIC PANEL WITH GFR
ALT: 10 U/L (ref 0–44)
AST: 16 U/L (ref 15–41)
Albumin: 3.4 g/dL — ABNORMAL LOW (ref 3.5–5.0)
Alkaline Phosphatase: 88 U/L (ref 38–126)
Anion gap: 12 (ref 5–15)
BUN: 45 mg/dL — ABNORMAL HIGH (ref 6–20)
CO2: 27 mmol/L (ref 22–32)
Calcium: 8.8 mg/dL — ABNORMAL LOW (ref 8.9–10.3)
Chloride: 103 mmol/L (ref 98–111)
Creatinine, Ser: 1.42 mg/dL — ABNORMAL HIGH (ref 0.44–1.00)
GFR, Estimated: 46 mL/min — ABNORMAL LOW (ref >60)
Glucose, Bld: 104 mg/dL — ABNORMAL HIGH (ref 70–99)
Potassium: 3.7 mmol/L (ref 3.5–5.1)
Sodium: 142 mmol/L (ref 135–145)
Total Bilirubin: 0.2 mg/dL (ref 0.0–1.2)
Total Protein: 6.1 g/dL — ABNORMAL LOW (ref 6.5–8.1)

## 2024-02-27 LAB — PRO BRAIN NATRIURETIC PEPTIDE: Pro Brain Natriuretic Peptide: 832 pg/mL — ABNORMAL HIGH (ref <300.0)

## 2024-02-27 MED ORDER — HYDRALAZINE HCL 20 MG/ML IJ SOLN
10.0000 mg | Freq: Once | INTRAMUSCULAR | Status: AC
Start: 2024-02-27 — End: 2024-02-27
  Administered 2024-02-27: 10 mg via INTRAVENOUS
  Filled 2024-02-27: qty 1

## 2024-02-27 MED ORDER — METHYLPREDNISOLONE SODIUM SUCC 125 MG IJ SOLR
125.0000 mg | Freq: Two times a day (BID) | INTRAMUSCULAR | Status: AC
  Administered 2024-02-27 – 2024-03-01 (×6): 125 mg via INTRAVENOUS
  Filled 2024-02-27 (×6): qty 2

## 2024-02-27 MED ORDER — FUROSEMIDE 10 MG/ML IJ SOLN
40.0000 mg | Freq: Every day | INTRAMUSCULAR | Status: DC
  Administered 2024-02-28 – 2024-03-02 (×4): 40 mg via INTRAVENOUS
  Filled 2024-02-27 (×4): qty 4

## 2024-02-27 MED ORDER — LIDOCAINE 5 % EX PTCH
1.0000 | MEDICATED_PATCH | CUTANEOUS | Status: DC
  Administered 2024-02-27 – 2024-03-02 (×3): 1 via TRANSDERMAL
  Filled 2024-02-27 (×4): qty 1

## 2024-02-27 MED ORDER — OXYCODONE HCL 5 MG PO TABS
5.0000 mg | ORAL_TABLET | ORAL | Status: DC | PRN
  Administered 2024-02-27 – 2024-03-02 (×5): 5 mg via ORAL
  Filled 2024-02-27 (×5): qty 1

## 2024-02-27 MED ORDER — INSULIN GLARGINE-YFGN 100 UNIT/ML ~~LOC~~ SOLN
20.0000 [IU] | Freq: Every day | SUBCUTANEOUS | Status: DC
  Filled 2024-02-27 (×2): qty 0.2

## 2024-02-27 MED ORDER — METHYLPREDNISOLONE SODIUM SUCC 40 MG IJ SOLR: 40.0000 mg | Freq: Two times a day (BID) | INTRAMUSCULAR | Status: DC

## 2024-02-27 MED ORDER — PIPERACILLIN-TAZOBACTAM 3.375 G IVPB
3.3750 g | Freq: Three times a day (TID) | INTRAVENOUS | Status: AC
  Administered 2024-02-27 – 2024-03-01 (×9): 3.375 g via INTRAVENOUS
  Filled 2024-02-27 (×9): qty 50

## 2024-02-27 MED ORDER — METHYLPREDNISOLONE SODIUM SUCC 125 MG IJ SOLR: 125.0000 mg | Freq: Two times a day (BID) | INTRAMUSCULAR | Status: DC

## 2024-02-27 NOTE — Plan of Care (Signed)
  Problem: Clinical Measurements: Goal: Ability to maintain clinical measurements within normal limits will improve Outcome: Progressing Goal: Will remain free from infection Outcome: Progressing Goal: Diagnostic test results will improve Outcome: Progressing Goal: Respiratory complications will improve Outcome: Progressing Goal: Cardiovascular complication will be avoided Outcome: Progressing   Problem: Activity: Goal: Risk for activity intolerance will decrease Outcome: Progressing   Problem: Coping: Goal: Level of anxiety will decrease Outcome: Progressing   Problem: Nutrition: Goal: Adequate nutrition will be maintained Outcome: Progressing

## 2024-02-27 NOTE — Plan of Care (Signed)

## 2024-02-27 NOTE — Progress Notes (Signed)
 NAME:  [name], MRN:  [id number], DOB:  14-Jul-1980, LOS: 4 ADMISSION DATE:  02/22/2024 CHIEF COMPLAINT:  SOB.    History of Present Illness:  This is a telemetry consult.  Consulting physician that is me is that [facility].  The patient is currently at [facility].  She is 43 year old female with history of lupus on Plaquenil, prednisone 10 mg, Mycophenolate, Benlysta ,  type 1 diabetes and hypertension. Presented with shortness of breath productive cough with yellow phlegm which started last week.  She works in a nursing home.  She received nebs Solu-Medrol IVF and supplemental oxygen.  On presentation to the hospital at [facility] she was found to be in mild DKA.  Her DKA resolved and currently she is on subcu insulin .  She did receive fluids for her DKA. She was started on Solu-Medrol, Zosyn and received couple of doses of vancomycin as well nebs treatment. Viral panel came back positive for rhino/enterovirus. CT chest was performed which extensive ground glass opacities bilaterally.  She was initially on 100%, then transition to 40% HFNC.  She smokes 5 cigarettes a day.  She has family history of asthma but no personal history of asthma. She denied episodes of pneumonia in the past. Denied history of prior or current hemoptysis.  Interim History / Subjective:   During the E-link/videocamera consult. She looks not in acute respiratory distress, sitting comfortable in HFNC 40%, and eating lunch. She reports she feels better, but continue to have shortness of breath. Denied hemoptysis.   Significant Hospital Events: 02/25/2024: PCCM consulted.  On high flow nasal cannula 85%. 02/27/2024: On HFNC 45%  Social history: Pets: Has a dog at home Occupation:Works as a Arts administrator in a rehabilitation setting Exposures: Normal, heart, Jacuzzi.  No fair pillows or comforters No h/o chemo/XRT/amiodarone/macrodantin/MTX  No exposure to asbestos, silica or other  organic allergens  Smoking history:Continues to smoke, with a 20-year history of tobacco use Travel history: No significant travel history Family history: No family history of lung disease   Objective    Blood pressure (!) 156/62, pulse 64, temperature 98.2 F (36.8 C), temperature source Oral, resp. rate 17, height 5' 3 (1.6 m), weight 75 kg, last menstrual period 10/26/2022, SpO2 95%.    FiO2 (%):  [45 %-80 %] 45 %  No intake or output data in the 24 hours ending 02/27/24 1146  Filed Weights   02/22/24 2314  Weight: 75 kg    Examination: Physical examination could not be performed as this is a telemetry consult.  CTA Chest 02/23/24 1. No pulmonary embolism identified to the segmental level; subsegmental evaluation limited by motion artifact. 2. Extensive bilateral ground-glass airspace disease, greatest in the upper lobes, with underlying interstitial thickening and diffuse bronchial wall thickening. This is most likely a multifocal pneumonia; other possibilities include drug reaction and alveolar proteinosis. . Pulmonary edema and ards are possible but there are no pleural effusions. 3. Interval mild enlargement of mediastinal and hilar lymph nodes up to 1.2 cm, likely reactive, without bulky or encasing adenopathy. 4. Distal thoracic esophageal wall thickening, commonly reflux-related; consider endoscopic evaluation  Echo 02/26/24 1. Left ventricular ejection fraction, by estimation, is 60 to 65%. The  left ventricle has normal function. The left ventricle has no regional  wall motion abnormalities. Left ventricular diastolic parameters are  consistent with Grade II diastolic dysfunction (pseudonormalization). Elevated left atrial pressure.   2. Right ventricular systolic function is normal. The right ventricular  size  is normal. Tricuspid regurgitation signal is inadequate for assessing  PA pressure.   3. The mitral valve is normal in structure. Trivial mitral valve   regurgitation. No evidence of mitral stenosis.   4. The aortic valve was not well visualized. Aortic valve regurgitation  is not visualized. No aortic stenosis is present.   5. The inferior vena cava is dilated in size with <50% respiratory  variability, suggesting right atrial pressure of 15 mmHg.   Assessment and Plan   Acute hypoxic respiratory failure Extensive GGO on CT scan Rhinovirus positive History of SLE on, plaquinil, prednisone, mycophenolate, Benlysta   73 female with history of SLE on Plaquenil, prednisone 10 mg, Mycophenolate, Benlysta , who came flu like symptoms for one week, and shortness of breath. Her CT chest showed extensive bilateral GGO's, diffuse bronchial wall thickening, possible pulmonary edema, a dramatic change compared to CT scan on January this year. RVSP is positive for rhinovirus.  Her echo is unrevealing with mild elevation of Atrial pressure of 15. I think patient has acute interstitial pneumonia trigger by rhinovirus, in the setting of lupus/immunocompromised patient. I don't think these dramatic changes on CT scan are only related to viral pneumonia. Other ddx: DAH, acute eosinophilic pneumonia although less likely with normal eos, acute lung injury, ARDS. She denied hemoptysis.   Ideally she will need sequential lavage bronch to rule out DAH, AEP, however I recommend against that given her high O2 requirement. I usually recommend high dose (125-500mg ) of steroids for 3 days , however she has improved her oxygenation already with solumedrol 40 mg BID. Given her extensive disease, I think I will do a trial of solumedrol 125 mg IV bid for 3 days and then transition to 40 mg BID or prednisone 1mg /kg. She will need a long taper for 2-3 months as well PJP prophylaxis. Given her immunosuppression, I will recommend to cover 7 days of antibiotics with Zosyn.  Plan: -continue zosyn for 7 days total (end date 10/25) -Solumedrol 125 mg IV BID for 3 days (10/22-25), then  transitioned to solumedrol 40 mg bid or prednisone (1mg /kg). She will need a long taper for 6-8 weeks.  -After her renal function improves, we can consider PJP prophylaxis with Bactrim 1DS, 3 times a week. I will hold it for now.  -Ok to hold cellcept for now. -Ok to continue lasix 40 mg daily, if kidney function continues to worsen, and she looks euvolemic (unable to assess through elink) hold lasix. -Continue LABA+ICS nebs -Continue HFNC, improving O2 requirement to 45% today. -Incentive spirometer every 2 hours when she is awake. -If respiratory status worsen, please don't hesitate to reach out, I have low threshold to transfer her to [facility]. -I spoke with Dr. Adriana over the phone about the plan today.  DKA - resolved  -Adjust insulin  per primary team due to high doses of steroids.  AKI versus CKD: Hold lasix if renal function worsen  Labs   CBC: Recent Labs  Lab 02/23/24 0329 02/24/24 0528 02/25/24 0728 02/26/24 0835 02/27/24 0442  WBC 19.2* 17.4* 22.1* 17.1* 13.1*  NEUTROABS  --   --   --  15.8* 11.9*  HGB 10.5* 9.8* 9.7* 9.4* 9.7*  HCT 32.6* 30.3* 30.5* 29.7* 30.6*  MCV 88.6 87.3 87.4 87.4 85.7  PLT 324 256 290 282 291    Basic Metabolic Panel: Recent Labs  Lab 02/23/24 1613 02/24/24 0528 02/25/24 0728 02/26/24 0835 02/27/24 0442  NA 137 138 141 142 142  K 4.3 4.1  4.1 3.6 3.7  CL 105 108 109 104 103  CO2 20* 17* 19* 22 27  GLUCOSE 192* 167* 173* 171* 104*  BUN 20 26* 36* 45* 45*  CREATININE 1.22* 1.59* 1.33* 1.48* 1.42*  CALCIUM  8.5* 8.3* 8.6* 8.8* 8.8*  MG  --  1.9  --   --   --   PHOS  --  3.8  --   --   --    GFR: Estimated Creatinine Clearance: 48.5 mL/min (A) (by C-G formula based on SCr of 1.42 mg/dL (H)). Recent Labs  Lab 02/23/24 0329 02/23/24 0757 02/24/24 0528 02/25/24 0728 02/25/24 1708 02/25/24 1940 02/26/24 0835 02/27/24 0442  WBC 19.2*  --  17.4* 22.1*  --   --  17.1* 13.1*  LATICACIDVEN 4.4* 3.7*  --   --  1.1 1.2  --    --     Liver Function Tests: Recent Labs  Lab 02/24/24 0528 02/26/24 0835 02/27/24 0442  AST 41 19 16  ALT 7 10 10   ALKPHOS 79 88 88  BILITOT 0.2 0.2 0.2  PROT 5.3* 6.1* 6.1*  ALBUMIN 2.9* 3.3* 3.4*   No results for input(s): LIPASE, AMYLASE in the last 168 hours. No results for input(s): AMMONIA in the last 168 hours.  ABG    Component Value Date/Time   PHART 7.36 02/26/2024 0424   PCO2ART 38 02/26/2024 0424   PO2ART 72 (L) 02/26/2024 0424   HCO3 21.5 02/26/2024 0424   TCO2 25 05/10/2017 2033   ACIDBASEDEF 3.6 (H) 02/26/2024 0424   O2SAT 94.7 02/26/2024 0424      Past Medical History:  She,  has a past medical history of Diabetes mellitus without complication (HCC), Hypertension, and Lupus.   Surgical History:   Past Surgical History:  Procedure Laterality Date   CERVICAL CONIZATION W/BX N/A 09/26/2023   Procedure: CONE BIOPSY, CERVIX;  Surgeon: Jayne Vonn DEL, MD;  Location: [facility];  Service: Gynecology;  Laterality: N/A;   SHOULDER SURGERY Left    UTERINE FIBROID SURGERY     WRIST SURGERY Right      Social History:   reports that she has been smoking cigarettes. She uses smokeless tobacco. She reports that she does not drink alcohol and does not use drugs.   Family History:  Her family history is not on file.   Allergies Allergies  Allergen Reactions   Strawberry Extract Dermatitis    Pt states she was allergic as a child but not so far as an adult.     Home Medications  Prior to Admission medications   Medication Sig Start Date End Date Taking? Authorizing Provider  amLODipine (NORVASC) 2.5 MG tablet Take 2.5 mg by mouth daily. 08/18/23   [provider]  amoxicillin-clavulanate (AUGMENTIN) 875-125 MG tablet Take 1 tablet by mouth every 12 (twelve) hours. 02/08/24   Towana Ozell BROCKS, MD  aspirin EC 81 MG tablet Take 81 mg by mouth daily. Swallow whole.    [provider]  Belimumab  (BENLYSTA  IV) Inject into the vein.     [provider]  HUMALOG 100 UNIT/ML injection Inject 100 Units into the skin daily. Via insulin  pump 01/29/24   [provider]  HYDROcodone -acetaminophen  (NORCO/VICODIN) 5-325 MG tablet Take 1 tablet by mouth every 6 (six) hours as needed. 09/26/23   Jayne Vonn DEL, MD  hydroxychloroquine (PLAQUENIL) 200 MG tablet Take 200 mg by mouth 2 (two) times daily.    [provider]  ketorolac  (TORADOL ) 10 MG tablet Take  1 tablet (10 mg total) by mouth every 8 (eight) hours as needed. 09/26/23   Jayne Vonn DEL, MD  Lancets (ACCU-CHEK MULTICLIX) lancets Use as instructed 08/01/14   Dhungel, Nishant, MD  lisinopril  (ZESTRIL ) 20 MG tablet Take 20 mg by mouth daily.    [provider]  LORazepam  (ATIVAN ) 0.5 MG tablet Take 0.5 mg by mouth 2 (two) times daily.    [provider]  medroxyPROGESTERone  (PROVERA ) 10 MG tablet Take 1 tablet (10 mg total) by mouth daily. Patient not taking: Reported on 12/18/2023 08/03/23   Jayne Vonn DEL, MD  mycophenolate (CELLCEPT) 500 MG tablet Take 1,500 mg by mouth 2 (two) times daily.    [provider]  nicotine  (NICODERM CQ  - DOSED IN MG/24 HOURS) 14 mg/24hr patch Place 1 patch (14 mg total) onto the skin daily. 12/18/23   Mannam, Praveen, MD  ondansetron  (ZOFRAN ) 4 MG tablet Take 1 tablet (4 mg total) by mouth every 6 (six) hours as needed for nausea. Patient not taking: Reported on 12/18/2023 05/14/17   Cheryle Page, MD  ondansetron  (ZOFRAN -ODT) 8 MG disintegrating tablet Take 1 tablet (8 mg total) by mouth every 8 (eight) hours as needed for nausea or vomiting. 02/08/24   Towana Ozell BROCKS, MD  oxyCODONE-acetaminophen  (PERCOCET/ROXICET) 5-325 MG tablet Take 1 tablet by mouth every 6 (six) hours as needed for severe pain (pain score 7-10). 02/08/24   Towana Ozell BROCKS, MD  pantoprazole  (PROTONIX ) 40 MG tablet Take 1 tablet (40 mg total) by mouth daily. Patient not taking: Reported on 12/18/2023 05/14/17   Cheryle Page, MD   predniSONE (DELTASONE) 10 MG tablet Take 15 mg by mouth daily with breakfast.    [provider]  pregabalin (LYRICA) 150 MG capsule Take 150 mg by mouth 3 (three) times daily.    [provider]  promethazine  (PHENERGAN ) 25 MG tablet Take 1 tablet (25 mg total) by mouth every 6 (six) hours as needed for nausea or vomiting. Patient not taking: Reported on 12/18/2023 04/24/18   Norris Will PARAS, PA-C  rosuvastatin (CRESTOR) 10 MG tablet Take 10 mg by mouth daily.    [provider]  sertraline  (ZOLOFT ) 100 MG tablet Take 200 mg by mouth daily.    [provider]  Varenicline  Tartrate, Starter, (CHANTIX  STARTING MONTH PAK) 0.5 MG X 11 & 1 MG X 42 TBPK Take 1 tablet by mouth daily. 12/18/23   Mannam, Praveen, MD     CRITICAL CARE Performed by: Marny Patch   Total critical care time: 45 minutes   Critical care time was exclusive of separately billable procedures and treating other patients.   Critical care was necessary to treat or prevent imminent or life-threatening deterioration.   Critical care was time spent personally by me on the following activities: development of treatment plan with patient and/or surrogate as well as nursing, discussions with consultants, evaluation of patient's response to treatment, examination of patient, obtaining history from patient or surrogate, ordering and performing treatments and interventions, ordering and review of laboratory studies, ordering and review of radiographic studies, pulse oximetry, re-evaluation of patient's condition and participation in multidisciplinary rounds.  Marny Patch, MD Pulmonary, Critical Care and Sleep Attending.  Pager: [phone]  02/27/2024, 11:46 AM

## 2024-02-27 NOTE — Progress Notes (Signed)
 PROGRESS NOTE    Patient: [name]                            PCP: Patient, No Pcp Per                    DOB: [birth year]            DOA: 02/22/2024 FMW:[id number]             DOS: 02/27/2024, 12:49 PM   LOS: 4 days   Date of Service: The patient was seen and examined on 02/27/2024  Subjective:   The patient was seen and examined this morning still complaining of shortness of breath with some improvement, but significant worse with minimal exertion  Satting 95% on HHFNC O2 flow rate of 20, FiO2 45%,   Brief Narrative:   LIISA PICONE is a 43 y.o. female with medical history significant of Lupus, Type 1 diabetes, cervical HSIL, and hypertension who was brought to the ED via EMS from local fire station endorsing few days of productive cough, increasing SOB this evening. Tried to drive here but had to stop at [facility] where she was reportedly hypoxic in the low 80s on room air. Given nebs, solumedrol and IVF and started on supplemental oxygen with some improvement.  Review of labs shows patient with elevated white count, elevated lactic acid level and multifocal pneumonia on xray consistent with sepsis. Blood cultures and ua collected tin ED. Elevated glucose with anion gap acidosis, BHA pending, loikely DKA   Assessment & Plan:   Principal Problem:   Sepsis due to pneumonia [facility]) Active Problems:   Diabetic ketoacidosis without coma associated with type 1 diabetes mellitus (HCC)   Multifocal pneumonia   Acute respiratory failure with hypoxia (HCC) Acute respiratory failure    Sepsis secondary to multifocal pneumonia -Exacerbated with acute respiratory failure -Viral panel positive for rhinovirus, enterovirus -Immunocompromised with a history of lupus - Continue IV antibiotics, IV steroids, respiratory support, via heated high flow oxygen -Anticipating addition of Bactrim for PC prophylaxis - Continue mucolytics, bronchodilator management and follow culture results -  Given the negative MRSA vancomycin discontinued. - Improving leukocytosis   Acute respiratory failure with hypoxia -Currently satting 95% on HHFNC  20 L FiO2 45% - In the setting of multifocal pneumonia most likely component of COPD/asthma/rhinovirus enterovirus -Continue IV steroids, antibiotics, nebs, Pulmicort -Discussed with pulmonary critical care team at [facility] Dr. Winfred -Increasing Solu-Medrol back 125 mg IV twice daily, increasing long-acting insulin ,  - 10/19/ Cx-ray consistent with diffuse infusion, consistent with multifocal pneumonia -IV Lasix 40 mg twice daily, switching to daily starting tomorrow due to elevated creatinine - Continue Brovana  - Continue the use of incentive spirometer and flutter valve. -Pulmonary/critical care team consulted, recommending continuing high flow heated oxygen by nasal cannula, if condition worsens BiPAP    Type I DKA - Resolved -monitoring hyperglycemic state due to steroids -Adding long-acting insulin  Semglee  15 units >>> increasing to 20 units CBG (last 3)  Recent Labs    02/27/24 0448 02/27/24 0720 02/27/24 1144  GLUCAP 106* 131* 147*   -Continue checking CBG q. ACHS, SSI coverage - Home insulin  pump-on hold - Most recent A1c of 9.6 demonstrating uncontrolled diabetes.   GERD/GI prophylaxis - Continue PPI.   Acute kidney injury on chronic kidney disease stage III A at baseline. - Continue to maintain adequate hydration - Minimize nephrotoxic agents - Follow renal  function trend Lab Results  Component Value Date   CREATININE 1.42 (H) 02/27/2024   CREATININE 1.48 (H) 02/26/2024   CREATININE 1.33 (H) 02/25/2024  Holding Lasix    History of lupus - Holding immunosuppressive agents at the moment - Patient on chronic steroids; stress dosages currently satisfied with the use of his steroids for reactive airway disease/chronic obstructive pulmonary disease exacerbation. -Adding Bactrim prophylaxis treatment    Obesity-overweight - Low-calorie diet and portion control discussed with patient -Body mass index is 29.29 kg/m     ----------------------------------------------------------------------------------------------------------------------------------------------- Nutritional status:  The patient's BMI is: Body mass index is 29.29 kg/m. I agree with the assessment and plan as outlined  Discussed regarding healthy exercise, healthy diet, diabetic diet Weight loss programs     ------------------------------------------------------------------------------------------------------------------------------------------------- Cultures; Respiratory panel positive for rhinovirus/   ------------------------------------------------------------------------------------------------------------------------------------------------  DVT prophylaxis:  heparin  injection 5,000 Units Start: 02/23/24 0600 SCDs Start: 02/23/24 0506   Code Status:   Code Status: Full Code  Family Communication: Husband present at bedside updated -Advance care planning has been discussed.   Admission status:   Status is: Inpatient Remains inpatient appropriate because: Needing respiratory support, IV steroids, nebs,   Disposition: From  - home             Planning for discharge in 2- 3 days  Will remain in ICU setting as patient continues to need heated high flow oxygen to maintain O2 sat  Procedures:   No admission procedures for hospital encounter.   Antimicrobials:  Anti-infectives (From admission, onward)    Start     Dose/Rate Route Frequency Ordered Stop   02/23/24 2200  vancomycin (VANCOCIN) IVPB 1000 mg/200 mL premix  Status:  Discontinued        1,000 mg 200 mL/hr over 60 Minutes Intravenous Every 24 hours 02/23/24 0414 02/24/24 1425   02/23/24 0330  piperacillin-tazobactam (ZOSYN) IVPB 3.375 g        3.375 g 12.5 mL/hr over 240 Minutes Intravenous Every 8 hours 02/23/24 0307     02/23/24 0315   vancomycin (VANCOREADY) IVPB 1500 mg/300 mL        1,500 mg 150 mL/hr over 120 Minutes Intravenous  Once 02/23/24 0307 02/23/24 0620   02/23/24 0015  cefTRIAXone  (ROCEPHIN ) 2 g in sodium chloride  0.9 % 100 mL IVPB        2 g 200 mL/hr over 30 Minutes Intravenous Once 02/23/24 0014 02/23/24 0144   02/23/24 0015  azithromycin  (ZITHROMAX ) 500 mg in sodium chloride  0.9 % 250 mL IVPB  Status:  Discontinued        500 mg 250 mL/hr over 60 Minutes Intravenous  Once 02/23/24 0014 02/23/24 0409        Medication:   arformoterol  15 mcg Nebulization BID   ascorbic acid  500 mg Oral Daily   benzonatate  200 mg Oral TID   budesonide (PULMICORT) nebulizer solution  0.5 mg Nebulization BID   Chlorhexidine  Gluconate Cloth  6 each Topical Daily   dextromethorphan-guaiFENesin  1 tablet Oral BID   docusate sodium  100 mg Oral BID   [START ON 02/28/2024] furosemide  40 mg Intravenous Daily   heparin   5,000 Units Subcutaneous Q8H   insulin  aspart  0-20 Units Subcutaneous Q4H   [START ON 02/28/2024] insulin  glargine-yfgn  20 Units Subcutaneous Daily   ipratropium-albuterol  3 mL Nebulization Q4H   lidocaine   1 patch Transdermal Q24H   methylPREDNISolone (SOLU-MEDROL) injection  125 mg Intravenous Q12H   pantoprazole  (PROTONIX ) IV  40 mg Intravenous Q24H   polyethylene glycol  17 g Oral Daily   sodium chloride  flush  10-40 mL Intracatheter Q12H   zinc sulfate (50mg  elemental zinc)  220 mg Oral Daily    acetaminophen , bisacodyl, chlorpheniramine-HYDROcodone , dextrose , diphenhydrAMINE , guaiFENesin-dextromethorphan, HYDROmorphone  (DILAUDID ) injection, melatonin, ondansetron  (ZOFRAN ) IV, mouth rinse, oxyCODONE, prochlorperazine, sodium chloride  flush   Objective:   Vitals:   02/27/24 0748 02/27/24 0800 02/27/24 0858 02/27/24 1020  BP:  (!) 132/46  (!) 156/62  Pulse:  76  64  Resp:  18  17  Temp: 98.2 F (36.8 C)     TempSrc: Oral     SpO2:  92% 91% 95%  Weight:      Height:       No intake  or output data in the 24 hours ending 02/27/24 1249  Filed Weights   02/22/24 2314  Weight: 75 kg     Physical examination:   General:  AAO x 3, awake alert oriented still complaining shortness of breath with minimal exertion  HEENT:  Normocephalic, PERRL, otherwise with in Normal limits   Neuro:  CNII-XII intact. , normal motor and sensation, reflexes intact   Lungs:   Mild labored breathing, able to speak with full sentences, diffuse wheezing, rhonchi, crackles bilaterally to mid lobes  Cardio:    S1/S2, RRR, No murmure, No Rubs or Gallops   Abdomen:  Soft, non-tender, bowel sounds active all four quadrants, no guarding or peritoneal signs.  Muscular  skeletal:  Limited exam -global generalized weaknesses - in bed, able to move all 4 extremities,   2+ pulses,  symmetric, No pitting edema  Skin:  Dry, warm to touch, negative for any Rashes,  Wounds: Please see nursing documentation     --------------------------------------------------------------------------------------------------------------------------    LABs:     Latest Ref Rng & Units 02/27/2024    4:42 AM 02/26/2024    8:35 AM 02/25/2024    7:28 AM  CBC  WBC 4.0 - 10.5 K/uL 13.1  17.1  22.1   Hemoglobin 12.0 - 15.0 g/dL 9.7  9.4  9.7   Hematocrit 36.0 - 46.0 % 30.6  29.7  30.5   Platelets 150 - 400 K/uL 291  282  290       Latest Ref Rng & Units 02/27/2024    4:42 AM 02/26/2024    8:35 AM 02/25/2024    7:28 AM  CMP  Glucose 70 - 99 mg/dL 895  828  826   BUN 6 - 20 mg/dL 45  45  36   Creatinine 0.44 - 1.00 mg/dL 8.57  8.51  8.66   Sodium 135 - 145 mmol/L 142  142  141   Potassium 3.5 - 5.1 mmol/L 3.7  3.6  4.1   Chloride 98 - 111 mmol/L 103  104  109   CO2 22 - 32 mmol/L 27  22  19    Calcium  8.9 - 10.3 mg/dL 8.8  8.8  8.6   Total Protein 6.5 - 8.1 g/dL 6.1  6.1    Total Bilirubin 0.0 - 1.2 mg/dL 0.2  0.2    Alkaline Phos 38 - 126 U/L 88  88    AST 15 - 41 U/L 16  19    ALT 0 - 44 U/L 10  10          Micro Results Recent Results (from the past 240 hours)  Culture, blood (routine x 2)     Status: None (Preliminary result)   Collection  Time: 02/23/24 12:53 AM   Specimen: Left Antecubital; Blood  Result Value Ref Range Status   Specimen Description LEFT ANTECUBITAL  Final   Special Requests   Final    BOTTLES DRAWN AEROBIC AND ANAEROBIC Blood Culture adequate volume   Culture   Final    NO GROWTH 4 DAYS Performed at [facility], [address], [place], [place] [address]    Report Status PENDING  Incomplete  Culture, blood (routine x 2)     Status: None (Preliminary result)   Collection Time: 02/23/24 12:57 AM   Specimen: Left Antecubital; Blood  Result Value Ref Range Status   Specimen Description LEFT ANTECUBITAL  Final   Special Requests   Final    BOTTLES DRAWN AEROBIC AND ANAEROBIC Blood Culture adequate volume   Culture   Final    NO GROWTH 4 DAYS Performed at [facility], [address], [place], [place] [address]    Report Status PENDING  Incomplete  MRSA Next Gen by PCR, Nasal     Status: None   Collection Time: 02/23/24  3:04 AM   Specimen: Nasal Mucosa; Nasal Swab  Result Value Ref Range Status   MRSA by PCR Next Gen NOT DETECTED NOT DETECTED Final    Comment: (NOTE) The GeneXpert MRSA Assay (FDA approved for NASAL specimens only), is one component of a comprehensive MRSA colonization surveillance program. It is not intended to diagnose MRSA infection nor to guide or monitor treatment for MRSA infections. Test performance is not FDA approved in patients less than 57 years old. Performed at [facility], [address], [place], [place] [address]   Respiratory (~20 pathogens) panel by PCR     Status: Abnormal   Collection Time: 02/23/24  6:54 AM   Specimen: Nasopharyngeal Swab; Respiratory  Result Value Ref Range Status   Adenovirus NOT DETECTED NOT DETECTED Final   Coronavirus 229E NOT DETECTED NOT DETECTED Final    Comment: (NOTE) The Coronavirus  on the Respiratory Panel, DOES NOT test for the novel  Coronavirus (2019 nCoV)    Coronavirus HKU1 NOT DETECTED NOT DETECTED Final   Coronavirus NL63 NOT DETECTED NOT DETECTED Final   Coronavirus OC43 NOT DETECTED NOT DETECTED Final   Metapneumovirus NOT DETECTED NOT DETECTED Final   Rhinovirus / Enterovirus DETECTED (A) NOT DETECTED Final   Influenza A NOT DETECTED NOT DETECTED Final   Influenza B NOT DETECTED NOT DETECTED Final   Parainfluenza Virus 1 NOT DETECTED NOT DETECTED Final   Parainfluenza Virus 2 NOT DETECTED NOT DETECTED Final   Parainfluenza Virus 3 NOT DETECTED NOT DETECTED Final   Parainfluenza Virus 4 NOT DETECTED NOT DETECTED Final   Respiratory Syncytial Virus NOT DETECTED NOT DETECTED Final   Bordetella pertussis NOT DETECTED NOT DETECTED Final   Bordetella Parapertussis NOT DETECTED NOT DETECTED Final   Chlamydophila pneumoniae NOT DETECTED NOT DETECTED Final   Mycoplasma pneumoniae NOT DETECTED NOT DETECTED Final    Comment: Performed at [facility], [address] [place], [place] [address]    Radiology Reports No results found.   SIGNED: Adriana DELENA Grams, MD, FHM. FAAFP. [facility] - [facility] Critical care time spent - 55 min.  In seeing, evaluating and examining the patient. Reviewing medical records, labs, drawn plan of care. [facility],  Pager (please use [email or web address] to page/ text) Please use Epic Secure Chat for non-urgent communication (7AM-7PM)  If 7PM-7AM, please contact night-coverage [email or web address], 02/27/2024, 12:49 PM

## 2024-02-28 DIAGNOSIS — J189 Pneumonia, unspecified organism: Secondary | ICD-10-CM | POA: Diagnosis not present

## 2024-02-28 DIAGNOSIS — A419 Sepsis, unspecified organism: Secondary | ICD-10-CM | POA: Diagnosis not present

## 2024-02-28 LAB — CULTURE, BLOOD (ROUTINE X 2)
Culture: NO GROWTH
Culture: NO GROWTH
Special Requests: ADEQUATE
Special Requests: ADEQUATE

## 2024-02-28 LAB — COMPREHENSIVE METABOLIC PANEL WITH GFR
ALT: 9 U/L (ref 0–44)
AST: 14 U/L — ABNORMAL LOW (ref 15–41)
Albumin: 3.3 g/dL — ABNORMAL LOW (ref 3.5–5.0)
Alkaline Phosphatase: 95 U/L (ref 38–126)
Anion gap: 12 (ref 5–15)
BUN: 37 mg/dL — ABNORMAL HIGH (ref 6–20)
CO2: 28 mmol/L (ref 22–32)
Calcium: 8.7 mg/dL — ABNORMAL LOW (ref 8.9–10.3)
Chloride: 98 mmol/L (ref 98–111)
Creatinine, Ser: 1.25 mg/dL — ABNORMAL HIGH (ref 0.44–1.00)
GFR, Estimated: 54 mL/min — ABNORMAL LOW (ref >60)
Glucose, Bld: 230 mg/dL — ABNORMAL HIGH (ref 70–99)
Potassium: 3.6 mmol/L (ref 3.5–5.1)
Sodium: 138 mmol/L (ref 135–145)
Total Bilirubin: 0.2 mg/dL (ref 0.0–1.2)
Total Protein: 6 g/dL — ABNORMAL LOW (ref 6.5–8.1)

## 2024-02-28 LAB — GLUCOSE, CAPILLARY
Glucose-Capillary: 174 mg/dL — ABNORMAL HIGH (ref 70–99)
Glucose-Capillary: 192 mg/dL — ABNORMAL HIGH (ref 70–99)
Glucose-Capillary: 200 mg/dL — ABNORMAL HIGH (ref 70–99)
Glucose-Capillary: 214 mg/dL — ABNORMAL HIGH (ref 70–99)
Glucose-Capillary: 214 mg/dL — ABNORMAL HIGH (ref 70–99)
Glucose-Capillary: 246 mg/dL — ABNORMAL HIGH (ref 70–99)

## 2024-02-28 LAB — CBC WITH DIFFERENTIAL/PLATELET
Abs Immature Granulocytes: 0.22 K/uL — ABNORMAL HIGH (ref 0.00–0.07)
Basophils Absolute: 0 K/uL (ref 0.0–0.1)
Basophils Relative: 0 %
Eosinophils Absolute: 0 K/uL (ref 0.0–0.5)
Eosinophils Relative: 0 %
HCT: 31.7 % — ABNORMAL LOW (ref 36.0–46.0)
Hemoglobin: 10.2 g/dL — ABNORMAL LOW (ref 12.0–15.0)
Immature Granulocytes: 3 %
Lymphocytes Relative: 6 %
Lymphs Abs: 0.5 K/uL — ABNORMAL LOW (ref 0.7–4.0)
MCH: 27.3 pg (ref 26.0–34.0)
MCHC: 32.2 g/dL (ref 30.0–36.0)
MCV: 84.8 fL (ref 80.0–100.0)
Monocytes Absolute: 0.1 K/uL (ref 0.1–1.0)
Monocytes Relative: 1 %
Neutro Abs: 7.5 K/uL (ref 1.7–7.7)
Neutrophils Relative %: 90 %
Platelets: 304 K/uL (ref 150–400)
RBC: 3.74 MIL/uL — ABNORMAL LOW (ref 3.87–5.11)
RDW: 14.3 % (ref 11.5–15.5)
WBC: 8.3 K/uL (ref 4.0–10.5)
nRBC: 0 % (ref 0.0–0.2)

## 2024-02-28 LAB — PRO BRAIN NATRIURETIC PEPTIDE: Pro Brain Natriuretic Peptide: 468 pg/mL — ABNORMAL HIGH (ref <300.0)

## 2024-02-28 MED ORDER — ROSUVASTATIN CALCIUM 10 MG PO TABS
10.0000 mg | ORAL_TABLET | Freq: Every day | ORAL | Status: DC
  Administered 2024-02-28 – 2024-03-02 (×4): 10 mg via ORAL
  Filled 2024-02-28 (×4): qty 1

## 2024-02-28 MED ORDER — PREDNISONE 20 MG PO TABS
60.0000 mg | ORAL_TABLET | Freq: Every day | ORAL | Status: DC
  Administered 2024-03-02: 60 mg via ORAL
  Filled 2024-02-28: qty 3

## 2024-02-28 MED ORDER — ASPIRIN 81 MG PO TBEC
81.0000 mg | DELAYED_RELEASE_TABLET | Freq: Every day | ORAL | Status: DC
  Administered 2024-02-28 – 2024-03-02 (×4): 81 mg via ORAL
  Filled 2024-02-28 (×4): qty 1

## 2024-02-28 MED ORDER — INSULIN GLARGINE-YFGN 100 UNIT/ML ~~LOC~~ SOLN
25.0000 [IU] | Freq: Every day | SUBCUTANEOUS | Status: DC
  Administered 2024-02-28 – 2024-03-02 (×4): 25 [IU] via SUBCUTANEOUS
  Filled 2024-02-28 (×5): qty 0.25

## 2024-02-28 MED ORDER — IPRATROPIUM-ALBUTEROL 0.5-2.5 (3) MG/3ML IN SOLN
3.0000 mL | Freq: Four times a day (QID) | RESPIRATORY_TRACT | Status: DC
  Administered 2024-02-28 – 2024-03-02 (×12): 3 mL via RESPIRATORY_TRACT
  Filled 2024-02-28 (×11): qty 3

## 2024-02-28 MED ORDER — LISINOPRIL 10 MG PO TABS
20.0000 mg | ORAL_TABLET | Freq: Every day | ORAL | Status: DC
  Administered 2024-02-28 – 2024-03-02 (×4): 20 mg via ORAL
  Filled 2024-02-28 (×4): qty 2

## 2024-02-28 MED ORDER — AMLODIPINE BESYLATE 5 MG PO TABS
5.0000 mg | ORAL_TABLET | Freq: Every day | ORAL | Status: DC
  Administered 2024-02-28 – 2024-03-02 (×4): 5 mg via ORAL
  Filled 2024-02-28 (×4): qty 1

## 2024-02-28 NOTE — Progress Notes (Signed)
 PROGRESS NOTE    Patient: [name]                            PCP: Patient, No Pcp Per                    DOB: [birth year]            DOA: 02/22/2024 FMW:[id number]             DOS: 02/28/2024, 11:05 AM   LOS: 5 days   Date of Service: The patient was seen and examined on 02/28/2024  Subjective:   The patient was seen and examined this morning, reporting improved shortness of breath. Comfortable overnight. This morning down from 20 to 15 L with 40% FiO2. Later this morning patient was ambulated was placed on 5 L of oxygen, she maintain O2 sat of 94%    Brief Narrative:   [name] is a 43 y.o. female with medical history significant of Lupus, Type 1 diabetes, cervical HSIL, and hypertension who was brought to the ED via EMS from local fire station endorsing few days of productive cough, increasing SOB this evening. Tried to drive here but had to stop at [facility] where she was reportedly hypoxic in the low 80s on room air. Given nebs, solumedrol and IVF and started on supplemental oxygen with some improvement.  Review of labs shows patient with elevated white count, elevated lactic acid level and multifocal pneumonia on xray consistent with sepsis. Blood cultures and ua collected tin ED. Elevated glucose with anion gap acidosis, BHA pending, loikely DKA   Assessment & Plan:   Principal Problem:   Sepsis due to pneumonia [facility]) Active Problems:   Diabetic ketoacidosis without coma associated with type 1 diabetes mellitus (HCC)   Multifocal pneumonia   Acute respiratory failure with hypoxia (HCC) Acute respiratory failure    Sepsis secondary to multifocal pneumonia- Exacerbated with acute respiratory failure -Improving sepsis physiology  -Viral panel positive for rhinovirus, enterovirus -Immunocompromised with a history of lupus  - On IV antibiotics, IV steroids, respiratory support, via heated high flow oxygen Escalated high-dose of steroids, de-escalating high  flow oxygen  -Anticipating addition of Bactrim for PC prophylaxis  - Continue mucolytics, bronchodilator management and follow culture results  - Given the negative MRSA vancomycin discontinued. - Improving leukocytosis   Acute respiratory failure with hypoxia -was satting 91% on HHFNC  20 L>> 15L -- FiO2 45>>40 % >>> de-escalated to 5 L, currently satting 94%  - In the setting of multifocal pneumonia most likely component of COPD/asthma/rhinovirus enterovirus  -Continue IV steroids was escalated up per PCCM recommendation - Continue antibiotics, nebs, Pulmicort -Discussed with pulmonary critical care team at [facility] Dr. Winfred  -Increasing Solu-Medrol back 125 mg IV twice daily, increasing long-acting insulin ,  -10/19/ Cx-ray consistent with diffuse infusion, consistent with multifocal pneumonia - IV Lasix 40 mg twice daily, switching to daily starting due to elevated creatinine - Continue Brovana  - Continue the use of incentive spirometer and flutter valve. -Pulmonary/critical care team consulted, recommending continuing high flow heated oxygen by nasal cannula, if condition worsens BiPAP    Type I DKA - Resolved -monitoring hyperglycemic state due to steroids -Adding long-acting insulin  Semglee  15 units >>> increasing to 20>>>25  units CBG (last 3)  Recent Labs    02/28/24 0005 02/28/24 0327 02/28/24 0718  GLUCAP 246* 214* 214*   -Continue checking CBG q. ACHS, SSI  coverage - Home insulin  pump-on hold - Most recent A1c of 9.6 demonstrating uncontrolled diabetes.   GERD/GI prophylaxis - Continue PPI.   Acute kidney injury on chronic kidney disease stage III A at baseline. - Continue to maintain adequate hydration - Minimize nephrotoxic agents - Follow renal function trend Lab Results  Component Value Date   CREATININE 1.25 (H) 02/28/2024   CREATININE 1.42 (H) 02/27/2024   CREATININE 1.48 (H) 02/26/2024   Was on IV Lasix     History of lupus - Holding  immunosuppressive agents at the moment - Patient on chronic steroids; stress dosages currently satisfied with the use of his steroids for reactive airway disease/chronic obstructive pulmonary disease exacerbation. -Adding Bactrim prophylaxis treatment   Obesity-overweight - Low-calorie diet and portion control discussed with patient -Body mass index is 29.29 kg/m     ----------------------------------------------------------------------------------------------------------------------------------------------- Nutritional status:  The patient's BMI is: Body mass index is 29.29 kg/m. I agree with the assessment and plan as outlined  Discussed regarding healthy exercise, healthy diet, diabetic diet Weight loss programs     ------------------------------------------------------------------------------------------------------------------------------------------------- Cultures; Respiratory panel positive for rhinovirus/   ------------------------------------------------------------------------------------------------------------------------------------------------  DVT prophylaxis:  heparin  injection 5,000 Units Start: 02/23/24 0600 SCDs Start: 02/23/24 0506   Code Status:   Code Status: Full Code  Family Communication: Husband present at bedside updated -Advance care planning has been discussed.   Admission status:   Status is: Inpatient Remains inpatient appropriate because: Needing respiratory support, IV steroids, nebs,   Disposition: From  - home             Planning for discharge in 2- 3 days  Will remain in ICU setting as patient continues to need heated high flow oxygen to maintain O2 sat  Procedures:   No admission procedures for hospital encounter.   Antimicrobials:  Anti-infectives (From admission, onward)    Start     Dose/Rate Route Frequency Ordered Stop   02/27/24 1400  piperacillin-tazobactam (ZOSYN) IVPB 3.375 g        3.375 g 12.5 mL/hr over 240  Minutes Intravenous Every 8 hours 02/27/24 1303 03/01/24 1359   02/23/24 2200  vancomycin (VANCOCIN) IVPB 1000 mg/200 mL premix  Status:  Discontinued        1,000 mg 200 mL/hr over 60 Minutes Intravenous Every 24 hours 02/23/24 0414 02/24/24 1425   02/23/24 0330  piperacillin-tazobactam (ZOSYN) IVPB 3.375 g  Status:  Discontinued        3.375 g 12.5 mL/hr over 240 Minutes Intravenous Every 8 hours 02/23/24 0307 02/27/24 1303   02/23/24 0315  vancomycin (VANCOREADY) IVPB 1500 mg/300 mL        1,500 mg 150 mL/hr over 120 Minutes Intravenous  Once 02/23/24 0307 02/23/24 0620   02/23/24 0015  cefTRIAXone  (ROCEPHIN ) 2 g in sodium chloride  0.9 % 100 mL IVPB        2 g 200 mL/hr over 30 Minutes Intravenous Once 02/23/24 0014 02/23/24 0144   02/23/24 0015  azithromycin  (ZITHROMAX ) 500 mg in sodium chloride  0.9 % 250 mL IVPB  Status:  Discontinued        500 mg 250 mL/hr over 60 Minutes Intravenous  Once 02/23/24 0014 02/23/24 0409        Medication:   arformoterol  15 mcg Nebulization BID   ascorbic acid  500 mg Oral Daily   benzonatate  200 mg Oral TID   budesonide (PULMICORT) nebulizer solution  0.5 mg Nebulization BID   Chlorhexidine  Gluconate Cloth  6 each Topical Daily  dextromethorphan-guaiFENesin  1 tablet Oral BID   docusate sodium  100 mg Oral BID   furosemide  40 mg Intravenous Daily   heparin   5,000 Units Subcutaneous Q8H   insulin  aspart  0-20 Units Subcutaneous Q4H   insulin  glargine-yfgn  25 Units Subcutaneous Daily   ipratropium-albuterol  3 mL Nebulization Q6H   lidocaine   1 patch Transdermal Q24H   methylPREDNISolone (SOLU-MEDROL) injection  125 mg Intravenous Q12H   [START ON 03/01/2024] methylPREDNISolone (SOLU-MEDROL) injection  40 mg Intravenous Q12H   pantoprazole  (PROTONIX ) IV  40 mg Intravenous Q24H   polyethylene glycol  17 g Oral Daily   sodium chloride  flush  10-40 mL Intracatheter Q12H   zinc sulfate (50mg  elemental zinc)  220 mg Oral Daily     acetaminophen , bisacodyl, chlorpheniramine-HYDROcodone , dextrose , diphenhydrAMINE , guaiFENesin-dextromethorphan, HYDROmorphone  (DILAUDID ) injection, melatonin, ondansetron  (ZOFRAN ) IV, mouth rinse, oxyCODONE, prochlorperazine, sodium chloride  flush   Objective:   Vitals:   02/28/24 0400 02/28/24 0738 02/28/24 0747 02/28/24 0859  BP:  (!) 160/66    Pulse: 63 65  69  Resp: 20 (!) 21  18  Temp: 97.6 F (36.4 C)  97.9 F (36.6 C)   TempSrc: Axillary  Oral   SpO2: 90% 91%  94%  Weight:      Height:        Intake/Output Summary (Last 24 hours) at 02/28/2024 1105 Last data filed at 02/28/2024 0953 Gross per 24 hour  Intake 130.07 ml  Output 750 ml  Net -619.93 ml    Filed Weights   02/22/24 2314  Weight: 75 kg     Physical examination:   General:  AAO x 3,  cooperative, improved shortness of breath with exertion  HEENT:  Normocephalic, PERRL, otherwise with in Normal limits   Neuro:  CNII-XII intact. , normal motor and sensation, reflexes intact   Lungs:   Diffuse rhonchi, crackles bilateral lower lobes, diffuse wheezing  Cardio:    S1/S2, RRR, No murmure, No Rubs or Gallops   Abdomen:  Soft, non-tender, bowel sounds active all four quadrants, no guarding or peritoneal signs.  Muscular  skeletal:  Limited exam -global generalized weaknesses - in bed, able to move all 4 extremities,   2+ pulses,  symmetric, No pitting edema  Skin:  Dry, warm to touch, negative for any Rashes,  Wounds: Please see nursing documentation    --------------------------------------------------------------------------------------------------------------------------    LABs:     Latest Ref Rng & Units 02/28/2024    3:18 AM 02/27/2024    4:42 AM 02/26/2024    8:35 AM  CBC  WBC 4.0 - 10.5 K/uL 8.3  13.1  17.1   Hemoglobin 12.0 - 15.0 g/dL 89.7  9.7  9.4   Hematocrit 36.0 - 46.0 % 31.7  30.6  29.7   Platelets 150 - 400 K/uL 304  291  282       Latest Ref Rng & Units 02/28/2024     3:18 AM 02/27/2024    4:42 AM 02/26/2024    8:35 AM  CMP  Glucose 70 - 99 mg/dL 769  895  828   BUN 6 - 20 mg/dL 37  45  45   Creatinine 0.44 - 1.00 mg/dL 8.74  8.57  8.51   Sodium 135 - 145 mmol/L 138  142  142   Potassium 3.5 - 5.1 mmol/L 3.6  3.7  3.6   Chloride 98 - 111 mmol/L 98  103  104   CO2 22 - 32 mmol/L 28  27  22   Calcium  8.9 - 10.3 mg/dL 8.7  8.8  8.8   Total Protein 6.5 - 8.1 g/dL 6.0  6.1  6.1   Total Bilirubin 0.0 - 1.2 mg/dL 0.2  0.2  0.2   Alkaline Phos 38 - 126 U/L 95  88  88   AST 15 - 41 U/L 14  16  19    ALT 0 - 44 U/L 9  10  10         Micro Results Recent Results (from the past 240 hours)  Culture, blood (routine x 2)     Status: None   Collection Time: 02/23/24 12:53 AM   Specimen: Left Antecubital; Blood  Result Value Ref Range Status   Specimen Description LEFT ANTECUBITAL  Final   Special Requests   Final    BOTTLES DRAWN AEROBIC AND ANAEROBIC Blood Culture adequate volume   Culture   Final    NO GROWTH 5 DAYS Performed at [facility], [address], [place], [place] [address]    Report Status 02/28/2024 FINAL  Final  Culture, blood (routine x 2)     Status: None   Collection Time: 02/23/24 12:57 AM   Specimen: Left Antecubital; Blood  Result Value Ref Range Status   Specimen Description LEFT ANTECUBITAL  Final   Special Requests   Final    BOTTLES DRAWN AEROBIC AND ANAEROBIC Blood Culture adequate volume   Culture   Final    NO GROWTH 5 DAYS Performed at [facility], [address], [place], [place] [address]    Report Status 02/28/2024 FINAL  Final  MRSA Next Gen by PCR, Nasal     Status: None   Collection Time: 02/23/24  3:04 AM   Specimen: Nasal Mucosa; Nasal Swab  Result Value Ref Range Status   MRSA by PCR Next Gen NOT DETECTED NOT DETECTED Final    Comment: (NOTE) The GeneXpert MRSA Assay (FDA approved for NASAL specimens only), is one component of a comprehensive MRSA colonization surveillance program. It is not intended to  diagnose MRSA infection nor to guide or monitor treatment for MRSA infections. Test performance is not FDA approved in patients less than 53 years old. Performed at [facility], [address], [place], [place] [address]   Respiratory (~20 pathogens) panel by PCR     Status: Abnormal   Collection Time: 02/23/24  6:54 AM   Specimen: Nasopharyngeal Swab; Respiratory  Result Value Ref Range Status   Adenovirus NOT DETECTED NOT DETECTED Final   Coronavirus 229E NOT DETECTED NOT DETECTED Final    Comment: (NOTE) The Coronavirus on the Respiratory Panel, DOES NOT test for the novel  Coronavirus (2019 nCoV)    Coronavirus HKU1 NOT DETECTED NOT DETECTED Final   Coronavirus NL63 NOT DETECTED NOT DETECTED Final   Coronavirus OC43 NOT DETECTED NOT DETECTED Final   Metapneumovirus NOT DETECTED NOT DETECTED Final   Rhinovirus / Enterovirus DETECTED (A) NOT DETECTED Final   Influenza A NOT DETECTED NOT DETECTED Final   Influenza B NOT DETECTED NOT DETECTED Final   Parainfluenza Virus 1 NOT DETECTED NOT DETECTED Final   Parainfluenza Virus 2 NOT DETECTED NOT DETECTED Final   Parainfluenza Virus 3 NOT DETECTED NOT DETECTED Final   Parainfluenza Virus 4 NOT DETECTED NOT DETECTED Final   Respiratory Syncytial Virus NOT DETECTED NOT DETECTED Final   Bordetella pertussis NOT DETECTED NOT DETECTED Final   Bordetella Parapertussis NOT DETECTED NOT DETECTED Final   Chlamydophila pneumoniae NOT DETECTED NOT DETECTED Final  Mycoplasma pneumoniae NOT DETECTED NOT DETECTED Final    Comment: Performed at [facility], [address], [place], [place] [address]    Radiology Reports No results found.   SIGNED: Adriana DELENA Grams, MD, FHM. FAAFP. Jolynn Pack - [facility] Critical care time spent - 55 min.  In seeing, evaluating and examining the patient. Reviewing medical records, labs, drawn plan of care. [facility],  Pager (please use [email or web address] to page/ text) Please use Epic Secure  Chat for non-urgent communication (7AM-7PM)  If 7PM-7AM, please contact night-coverage [email or web address], 02/28/2024, 11:05 AM

## 2024-02-28 NOTE — Progress Notes (Signed)
 Physical Therapy Treatment Patient Details Name: [name] MRN: [id number] DOB: March 16, 1981 Today's Date: 02/28/2024   History of Present Illness [name] is a 43 y.o. female with medical history significant of Lupus, Type 1 diabetes, cervical HSIL, and hypertension who was brought to the ED via EMS from local fire station endorsing few days of productive cough, increasing SOB this evening. Tried to drive here but had to stop at [facility] where she was reportedly hypoxic in the low 80s on room air. Given nebs, solumedrol and IVF and started on supplemental oxygen with some improvement.   Review of labs shows patient with elevated white count, elevated lactic acid level and multifocal pneumonia on xray consistent with sepsis. Blood cultures and ua collected tin ED. Elevated glucose with anion gap acidosis, BHA pending, loikely DKA    PT Comments  Pt. Tolerated session and seemed motivated with treatment. Pt. Was able to get to EOB for warm-up independently and maintain SpO2 at 100% with 7L ([place] and HFNC),with transfer from sit to stand and ambulation pt. Oxygen was adjusted to 5L and maintained SpO2 at 97%. Nursing staff was notified on pt. Status Pt. Felt fatigued towards of ambulation. Patient will continue to benefit from skilled physical therapy in hospital and recommended venue below to continue to increase strength, balance, endurance for safe ADLs and gait.     If plan is discharge home, recommend the following: Assist for transportation;A little help with walking and/or transfers   Can travel by private vehicle      Yes  Equipment Recommendations  None recommended by PT    Recommendations for Other Services       Precautions / Restrictions Precautions Precautions: Fall Recall of Precautions/Restrictions: Intact Precaution/Restrictions Comments: Moderate Fall risk Restrictions Weight Bearing Restrictions Per Provider Order: No     Mobility  Bed Mobility Overal bed  mobility: Independent             General bed mobility comments: Pt. can't lay flat on bed, causes chest discomfort, Pt. was able to get EOB    Transfers Overall transfer level: Independent                 General transfer comment: From sit to stand was independent    Ambulation/Gait Ambulation/Gait assistance: Independent Gait Distance (Feet): 30 Feet Assistive device: None Gait Pattern/deviations: Step-through pattern, Decreased step length - right, Decreased step length - left, Decreased stance time - right, Narrow base of support, Decreased stride length       General Gait Details: Pt. was able to ambulate in the room with [place] and HFNC, using 7L with SpO2 100% and began to wean off to 5L with SpO2 97% with ambulation   Stairs             Wheelchair Mobility     Tilt Bed    Modified Rankin (Stroke Patients Only)       Balance Overall balance assessment: Independent                                          Communication Communication Communication: No apparent difficulties  Cognition Arousal: Alert Behavior During Therapy: WFL for tasks assessed/performed   PT - Cognitive impairments: No apparent impairments                         Following  commands: Intact      Cueing Cueing Techniques: Verbal cues  Exercises General Exercises - Lower Extremity Straight Leg Raises: Seated, AROM, Both, 10 reps, Strengthening Hip Flexion/Marching: Seated, AROM, Both, 10 reps, Strengthening Toe Raises: Seated, AROM, Both, 10 reps, Strengthening Heel Raises: Seated, AROM, Both, 10 reps, Strengthening    General Comments        Pertinent Vitals/Pain Pain Assessment Pain Assessment: No/denies pain    Home Living                          Prior Function            PT Goals (current goals can now be found in the care plan section) Acute Rehab PT Goals Patient Stated Goal: Pt. wants to return home with family  and spouse. PT Goal Formulation: With patient Time For Goal Achievement: 03/04/24 Potential to Achieve Goals: Good Progress towards PT goals: Progressing toward goals    Frequency    Min 3X/week      PT Plan      Co-evaluation              AM-PAC PT 6 Clicks Mobility   Outcome Measure  Help needed turning from your back to your side while in a flat bed without using bedrails?: A Little Help needed moving from lying on your back to sitting on the side of a flat bed without using bedrails?: A Little Help needed moving to and from a bed to a chair (including a wheelchair)?: None Help needed standing up from a chair using your arms (e.g., wheelchair or bedside chair)?: None Help needed to walk in hospital room?: A Little Help needed climbing 3-5 steps with a railing? : A Little 6 Click Score: 20    End of Session   Activity Tolerance: Patient tolerated treatment well Patient left: in bed;with family/visitor present Nurse Communication: Mobility status PT Visit Diagnosis: Muscle weakness (generalized) (M62.81);Other (comment);History of falling (Z91.81) (weakness secondary to pneumonia)     Time: 8996-8973 PT Time Calculation (min) (ACUTE ONLY): 23 min  Charges:    $Gait Training: 8-22 mins $Therapeutic Exercise: 8-22 mins PT General Charges $$ ACUTE PT VISIT: 1 Visit                    Prince Olivier, SPT

## 2024-02-28 NOTE — Inpatient Diabetes Management (Signed)
 Inpatient Diabetes Program Recommendations  AACE/ADA: New Consensus Statement on Inpatient Glycemic Control  Target Ranges:  Prepandial:   less than 140 mg/dL      Peak postprandial:   less than 180 mg/dL (1-2 hours)      Critically ill patients:  140 - 180 mg/dL    Latest Reference Range & Units 02/27/24 07:20 02/27/24 11:44 02/27/24 16:58 02/27/24 17:54 02/27/24 20:11 02/28/24 00:05 02/28/24 03:27 02/28/24 07:18  Glucose-Capillary 70 - 99 mg/dL 868 (H) 852 (H) 813 (H) 298 (H) 322 (H) 246 (H) 214 (H) 214 (H)   Review of Glycemic Control  Diabetes history: DM1 Outpatient Diabetes medications: Insulin  Pump with Humalog Current orders for Inpatient glycemic control: Semglee  20 units daily, Novolog  0-20 units Q4H; Solumedrol 125 mg Q12H  Inpatient Diabetes Program Recommendations:    Insulin : Noted Solumedrol dose increased. If steroids are continued as ordered, please consider increasing Semglee  to 25 units daily and adding Novolog  4 units TID with meals for meal coverage if patient eats at least 50% of meals.  Thanks, Earnie Gainer, RN, MSN, CDCES Diabetes Coordinator Inpatient Diabetes Program [phone] (Team Pager from 8am to 5pm)

## 2024-02-28 NOTE — Progress Notes (Signed)
 NAME:  [name], MRN:  [id number], DOB:  11-06-1980, LOS: 5 ADMISSION DATE:  02/22/2024 CHIEF COMPLAINT:  SOB.    History of Present Illness:  This is a telemetry consult.  Consulting physician that is me is that [facility].  The patient is currently at [facility].  She is 43 year old female with history of lupus on Plaquenil, prednisone 10 mg, Mycophenolate, Benlysta ,  type 1 diabetes and hypertension. Presented with shortness of breath productive cough with yellow phlegm which started last week.  She works in a nursing home.  She received nebs Solu-Medrol IVF and supplemental oxygen.  On presentation to the hospital at [facility] she was found to be in mild DKA.  Her DKA resolved and currently she is on subcu insulin .  She did receive fluids for her DKA. She was started on Solu-Medrol, Zosyn and received couple of doses of vancomycin as well nebs treatment. Viral panel came back positive for rhino/enterovirus. CT chest was performed which extensive ground glass opacities bilaterally.  She was initially on 100%, then transition to 40% HFNC.  She smokes 5 cigarettes a day.  She has family history of asthma but no personal history of asthma. She denied episodes of pneumonia in the past. Denied history of prior or current hemoptysis.  Interim History / Subjective:   During the E-link/videocamera consult. She looks not in acute respiratory distress, sitting comfortable on Nasal cannula 4-5L.  No side effects from high dose steroids.  Significant Hospital Events: 02/25/2024: PCCM consulted.  On high flow nasal cannula 85%. 02/27/2024: On HFNC 45% 02/28/24: 5L [place] 95%  Social history: Pets: Has a dog at home Occupation:Works as a Arts administrator in a rehabilitation setting Exposures: Normal, heart, Jacuzzi.  No fair pillows or comforters No h/o chemo/XRT/amiodarone/macrodantin/MTX  No exposure to asbestos, silica or other organic allergens  Smoking  history:Continues to smoke, with a 20-year history of tobacco use Travel history: No significant travel history Family history: No family history of lung disease   Objective    Blood pressure (!) 160/66, pulse 69, temperature 97.9 F (36.6 C), temperature source Oral, resp. rate 18, height 5' 3 (1.6 m), weight 75 kg, last menstrual period 10/26/2022, SpO2 94%.    FiO2 (%):  [40 %] 40 %   Intake/Output Summary (Last 24 hours) at 02/28/2024 1345 Last data filed at 02/28/2024 0953 Gross per 24 hour  Intake 130.07 ml  Output 750 ml  Net -619.93 ml    Filed Weights   02/22/24 2314  Weight: 75 kg    Examination: Physical examination could not be performed as this is a telemetry consult.  CTA Chest 02/23/24 1. No pulmonary embolism identified to the segmental level; subsegmental evaluation limited by motion artifact. 2. Extensive bilateral ground-glass airspace disease, greatest in the upper lobes, with underlying interstitial thickening and diffuse bronchial wall thickening. This is most likely a multifocal pneumonia; other possibilities include drug reaction and alveolar proteinosis. . Pulmonary edema and ards are possible but there are no pleural effusions. 3. Interval mild enlargement of mediastinal and hilar lymph nodes up to 1.2 cm, likely reactive, without bulky or encasing adenopathy. 4. Distal thoracic esophageal wall thickening, commonly reflux-related; consider endoscopic evaluation  Echo 02/26/24 1. Left ventricular ejection fraction, by estimation, is 60 to 65%. The  left ventricle has normal function. The left ventricle has no regional  wall motion abnormalities. Left ventricular diastolic parameters are  consistent with Grade II diastolic dysfunction (pseudonormalization). Elevated left atrial  pressure.   2. Right ventricular systolic function is normal. The right ventricular  size is normal. Tricuspid regurgitation signal is inadequate for assessing  PA pressure.    3. The mitral valve is normal in structure. Trivial mitral valve  regurgitation. No evidence of mitral stenosis.   4. The aortic valve was not well visualized. Aortic valve regurgitation  is not visualized. No aortic stenosis is present.   5. The inferior vena cava is dilated in size with <50% respiratory  variability, suggesting right atrial pressure of 15 mmHg.   Assessment and Plan   Acute hypoxic respiratory failure Extensive GGO on CT scan  Rhinovirus positive History of SLE on, plaquinil, prednisone, mycophenolate, Benlysta   11 female with history of SLE on Plaquenil, prednisone 10 mg, Mycophenolate, Benlysta , who came flu like symptoms for one week, and shortness of breath. Her CT chest showed extensive bilateral GGO's a dramatic change compared to CT scan on January this year. RVSP is positive for rhinovirus.  Her echo is unrevealing.  I think patient has acute interstitial pneumonia trigger by rhinovirus, in the setting of lupus/immunocompromised patient. I don't think these dramatic changes on CT scan are only related to viral pneumonia, I think there is a big contribution of her autoimmune disease. Other ddx: DAH, acute eosinophilic pneumonia (although less likely with normal eos), acute lung injury, ARDS.  Ideally she will need sequential lavage bronch to rule out DAH, AEP, however I recommend against that given her high O2 requirement.   Plan to high dose CS for 3 days and then transition to long taper of prednisone. She will need PJP prophylaxis.  Plan: -continue zosyn for 7 days total (end date 10/25) -Solumedrol 125 mg IV BID for 3 days (10/22-25), then transitioned to prednisone  60 mg (on 03/02/24) for 2 week, then 50 mg for 2 weeks, 40 mg for 2w, 30 mg 2 w, 20 mg 2 weeks, and  continue 10 mg daily at her prior home dose. -Insulin  will need to be adjusted at discharge. -After her renal function improves, I will PJP prophylaxis with Bactrim 1DS, 3 times a week, at the time of  discharge.  -Ok to hold cellcept for now, I will resume when she is clinically ready for discharge. -If patient looks fluid overload, ok for lasix , if not, discontinue (unable to assess through elink). -Continue LABA+ICS nebs -Incentive spirometer every 2 hours when she is awake. -On 5L  -Primary team updated.  DKA - resolved  -Adjust insulin  per primary team due to high doses of steroids.  AKI versus CKD: Hold lasix if renal function worsen. Renal function improving  Labs   CBC: Recent Labs  Lab 02/24/24 0528 02/25/24 0728 02/26/24 0835 02/27/24 0442 02/28/24 0318  WBC 17.4* 22.1* 17.1* 13.1* 8.3  NEUTROABS  --   --  15.8* 11.9* 7.5  HGB 9.8* 9.7* 9.4* 9.7* 10.2*  HCT 30.3* 30.5* 29.7* 30.6* 31.7*  MCV 87.3 87.4 87.4 85.7 84.8  PLT 256 290 282 291 304    Basic Metabolic Panel: Recent Labs  Lab 02/24/24 0528 02/25/24 0728 02/26/24 0835 02/27/24 0442 02/28/24 0318  NA 138 141 142 142 138  K 4.1 4.1 3.6 3.7 3.6  CL 108 109 104 103 98  CO2 17* 19* 22 27 28   GLUCOSE 167* 173* 171* 104* 230*  BUN 26* 36* 45* 45* 37*  CREATININE 1.59* 1.33* 1.48* 1.42* 1.25*  CALCIUM  8.3* 8.6* 8.8* 8.8* 8.7*  MG 1.9  --   --   --   --  PHOS 3.8  --   --   --   --    GFR: Estimated Creatinine Clearance: 55.1 mL/min (A) (by C-G formula based on SCr of 1.25 mg/dL (H)). Recent Labs  Lab 02/23/24 0329 02/23/24 0757 02/24/24 0528 02/25/24 0728 02/25/24 1708 02/25/24 1940 02/26/24 0835 02/27/24 0442 02/28/24 0318  WBC 19.2*  --    < > 22.1*  --   --  17.1* 13.1* 8.3  LATICACIDVEN 4.4* 3.7*  --   --  1.1 1.2  --   --   --    < > = values in this interval not displayed.    Liver Function Tests: Recent Labs  Lab 02/24/24 0528 02/26/24 0835 02/27/24 0442 02/28/24 0318  AST 41 19 16 14*  ALT 7 10 10 9   ALKPHOS 79 88 88 95  BILITOT 0.2 0.2 0.2 0.2  PROT 5.3* 6.1* 6.1* 6.0*  ALBUMIN 2.9* 3.3* 3.4* 3.3*   No results for input(s): LIPASE, AMYLASE in the last 168  hours. No results for input(s): AMMONIA in the last 168 hours.  ABG    Component Value Date/Time   PHART 7.36 02/26/2024 0424   PCO2ART 38 02/26/2024 0424   PO2ART 72 (L) 02/26/2024 0424   HCO3 21.5 02/26/2024 0424   TCO2 25 05/10/2017 2033   ACIDBASEDEF 3.6 (H) 02/26/2024 0424   O2SAT 94.7 02/26/2024 0424      Past Medical History:  She,  has a past medical history of Diabetes mellitus without complication (HCC), Hypertension, and Lupus.   Surgical History:   Past Surgical History:  Procedure Laterality Date   CERVICAL CONIZATION W/BX N/A 09/26/2023   Procedure: CONE BIOPSY, CERVIX;  Surgeon: Jayne Vonn DEL, MD;  Location: [facility];  Service: Gynecology;  Laterality: N/A;   SHOULDER SURGERY Left    UTERINE FIBROID SURGERY     WRIST SURGERY Right      Social History:   reports that she has been smoking cigarettes. She uses smokeless tobacco. She reports that she does not drink alcohol and does not use drugs.   Family History:  Her family history is not on file.   Allergies Allergies  Allergen Reactions   Strawberry Extract Dermatitis    Pt states she was allergic as a child but not so far as an adult.     Home Medications  Prior to Admission medications   Medication Sig Start Date End Date Taking? Authorizing Provider  amLODipine (NORVASC) 2.5 MG tablet Take 2.5 mg by mouth daily. 08/18/23   [provider]  amoxicillin-clavulanate (AUGMENTIN) 875-125 MG tablet Take 1 tablet by mouth every 12 (twelve) hours. 02/08/24   Towana Ozell BROCKS, MD  aspirin EC 81 MG tablet Take 81 mg by mouth daily. Swallow whole.    [provider]  Belimumab  (BENLYSTA  IV) Inject into the vein.    [provider]  HUMALOG 100 UNIT/ML injection Inject 100 Units into the skin daily. Via insulin  pump 01/29/24   [provider]  HYDROcodone -acetaminophen  (NORCO/VICODIN) 5-325 MG tablet Take 1 tablet by mouth every 6 (six) hours as needed. 09/26/23   Jayne Vonn DEL, MD  hydroxychloroquine (PLAQUENIL) 200 MG tablet Take 200 mg by mouth 2 (two) times daily.    [provider]  ketorolac  (TORADOL ) 10 MG tablet Take 1 tablet (10 mg total) by mouth every 8 (eight) hours as needed. 09/26/23   Jayne Vonn DEL, MD  Lancets (ACCU-CHEK MULTICLIX) lancets Use as instructed 08/01/14   Dhungel, Nishant,  MD  lisinopril  (ZESTRIL ) 20 MG tablet Take 20 mg by mouth daily.    [provider]  LORazepam  (ATIVAN ) 0.5 MG tablet Take 0.5 mg by mouth 2 (two) times daily.    [provider]  medroxyPROGESTERone  (PROVERA ) 10 MG tablet Take 1 tablet (10 mg total) by mouth daily. Patient not taking: Reported on 12/18/2023 08/03/23   Jayne Vonn DEL, MD  mycophenolate (CELLCEPT) 500 MG tablet Take 1,500 mg by mouth 2 (two) times daily.    [provider]  nicotine  (NICODERM CQ  - DOSED IN MG/24 HOURS) 14 mg/24hr patch Place 1 patch (14 mg total) onto the skin daily. 12/18/23   Mannam, Praveen, MD  ondansetron  (ZOFRAN ) 4 MG tablet Take 1 tablet (4 mg total) by mouth every 6 (six) hours as needed for nausea. Patient not taking: Reported on 12/18/2023 05/14/17   Cheryle Page, MD  ondansetron  (ZOFRAN -ODT) 8 MG disintegrating tablet Take 1 tablet (8 mg total) by mouth every 8 (eight) hours as needed for nausea or vomiting. 02/08/24   Towana Ozell BROCKS, MD  oxyCODONE-acetaminophen  (PERCOCET/ROXICET) 5-325 MG tablet Take 1 tablet by mouth every 6 (six) hours as needed for severe pain (pain score 7-10). 02/08/24   Towana Ozell BROCKS, MD  pantoprazole  (PROTONIX ) 40 MG tablet Take 1 tablet (40 mg total) by mouth daily. Patient not taking: Reported on 12/18/2023 05/14/17   Cheryle Page, MD  predniSONE (DELTASONE) 10 MG tablet Take 15 mg by mouth daily with breakfast.    [provider]  pregabalin (LYRICA) 150 MG capsule Take 150 mg by mouth 3 (three) times daily.    [provider]  promethazine  (PHENERGAN ) 25 MG tablet Take 1 tablet (25 mg total) by  mouth every 6 (six) hours as needed for nausea or vomiting. Patient not taking: Reported on 12/18/2023 04/24/18   Norris Will PARAS, PA-C  rosuvastatin (CRESTOR) 10 MG tablet Take 10 mg by mouth daily.    [provider]  sertraline  (ZOLOFT ) 100 MG tablet Take 200 mg by mouth daily.    [provider]  Varenicline  Tartrate, Starter, (CHANTIX  STARTING MONTH PAK) 0.5 MG X 11 & 1 MG X 42 TBPK Take 1 tablet by mouth daily. 12/18/23   Mannam, Praveen, MD     CRITICAL CARE Performed by: Marny Patch   Total critical care time: 30 minutes   Critical care time was exclusive of separately billable procedures and treating other patients.   Critical care was necessary to treat or prevent imminent or life-threatening deterioration.   Critical care was time spent personally by me on the following activities: development of treatment plan with patient and/or surrogate as well as nursing, discussions with consultants, evaluation of patient's response to treatment, examination of patient, obtaining history from patient or surrogate, ordering and performing treatments and interventions, ordering and review of laboratory studies, ordering and review of radiographic studies, pulse oximetry, re-evaluation of patient's condition and participation in multidisciplinary rounds.  Marny Patch, MD Pulmonary, Critical Care Pager: [phone]  02/28/2024, 1:44 PM

## 2024-02-29 ENCOUNTER — Inpatient Hospital Stay (HOSPITAL_COMMUNITY)

## 2024-02-29 DIAGNOSIS — A419 Sepsis, unspecified organism: Secondary | ICD-10-CM | POA: Diagnosis not present

## 2024-02-29 DIAGNOSIS — J189 Pneumonia, unspecified organism: Secondary | ICD-10-CM | POA: Diagnosis not present

## 2024-02-29 LAB — GLUCOSE, CAPILLARY
Glucose-Capillary: 115 mg/dL — ABNORMAL HIGH (ref 70–99)
Glucose-Capillary: 139 mg/dL — ABNORMAL HIGH (ref 70–99)
Glucose-Capillary: 167 mg/dL — ABNORMAL HIGH (ref 70–99)
Glucose-Capillary: 197 mg/dL — ABNORMAL HIGH (ref 70–99)
Glucose-Capillary: 217 mg/dL — ABNORMAL HIGH (ref 70–99)
Glucose-Capillary: 359 mg/dL — ABNORMAL HIGH (ref 70–99)
Glucose-Capillary: 387 mg/dL — ABNORMAL HIGH (ref 70–99)
Glucose-Capillary: 425 mg/dL — ABNORMAL HIGH (ref 70–99)

## 2024-02-29 NOTE — Plan of Care (Signed)

## 2024-02-29 NOTE — Progress Notes (Signed)
 Mobility Specialist Progress Note:    02/29/24 1040  Mobility  Activity Ambulated with assistance  Level of Assistance Independent  Assistive Device None  Distance Ambulated (ft) 240 ft  Range of Motion/Exercises Active;All extremities  Activity Response Tolerated well  Mobility Referral Yes  Mobility visit 1 Mobility  Mobility Specialist Start Time (ACUTE ONLY) 1040  Mobility Specialist Stop Time (ACUTE ONLY) 1100  Mobility Specialist Time Calculation (min) (ACUTE ONLY) 20 min   Pt received in bed, agreeable to mobility. Independently able to stand and ambulate with no AD. Tolerated well, SpO2 96% on 5L at rest. SpO2 93-95% on 4L during ambulation. Left sitting EOB,  all needs met.  [name] Mobility Specialist Please contact via Special educational needs teacher or  Rehab office at [phone]

## 2024-02-29 NOTE — Procedures (Signed)
 NAME:  [name], MRN:  [id number], DOB:  11-05-1980, LOS: 6 ADMISSION DATE:  02/22/2024 CHIEF COMPLAINT:  SOB.    History of Present Illness:  This is a telemetry consult.  Consulting physician that is me is that [facility].  The patient is currently at [facility].  She is 43 year old female with history of lupus on Plaquenil, prednisone 10 mg, Mycophenolate, Benlysta ,  type 1 diabetes and hypertension. Presented with shortness of breath productive cough with yellow phlegm which started last week.  She works in a nursing home.  She received nebs Solu-Medrol IVF and supplemental oxygen.  On presentation to the hospital at [facility] she was found to be in mild DKA.  Her DKA resolved and currently she is on subcu insulin .  She did receive fluids for her DKA. She was started on Solu-Medrol, Zosyn and received couple of doses of vancomycin as well nebs treatment. Viral panel came back positive for rhino/enterovirus. CT chest was performed which extensive ground glass opacities bilaterally.  She was initially on 100%, then transition to 40% HFNC.  She smokes 5 cigarettes a day.  She has family history of asthma but no personal history of asthma. She denied episodes of pneumonia in the past. Denied history of prior or current hemoptysis.  Interim History / Subjective:  Unable to perform Elink, given patient change to a different room, and there is not camera.  I spoke with Dr. Adriana, with major improvement. Currently 4l [place] walking.  Significant Hospital Events: 02/25/2024: PCCM consulted.  On high flow nasal cannula 85%. 02/27/2024: On HFNC 45% 02/28/24: 5L  95%  Social history: Pets: Has a dog at home Occupation:Works as a Arts administrator in a rehabilitation setting Exposures: Normal, heart, Jacuzzi.  No fair pillows or comforters No h/o chemo/XRT/amiodarone/macrodantin/MTX  No exposure to asbestos, silica or other organic allergens  Smoking history:Continues  to smoke, with a 20-year history of tobacco use Travel history: No significant travel history Family history: No family history of lung disease   Objective    Blood pressure 129/76, pulse 72, temperature 97.8 F (36.6 C), temperature source Oral, resp. rate 17, height 5' 3 (1.6 m), weight 75 kg, last menstrual period 10/26/2022, SpO2 96%.        Intake/Output Summary (Last 24 hours) at 02/29/2024 1948 Last data filed at 02/29/2024 1439 Gross per 24 hour  Intake 1182.23 ml  Output --  Net 1182.23 ml    Filed Weights   02/22/24 2314  Weight: 75 kg    Examination: Physical examination could not be performed as this is a telemetry consult.  CTA Chest 02/23/24 1. No pulmonary embolism identified to the segmental level; subsegmental evaluation limited by motion artifact. 2. Extensive bilateral ground-glass airspace disease, greatest in the upper lobes, with underlying interstitial thickening and diffuse bronchial wall thickening. This is most likely a multifocal pneumonia; other possibilities include drug reaction and alveolar proteinosis. . Pulmonary edema and ards are possible but there are no pleural effusions. 3. Interval mild enlargement of mediastinal and hilar lymph nodes up to 1.2 cm, likely reactive, without bulky or encasing adenopathy. 4. Distal thoracic esophageal wall thickening, commonly reflux-related; consider endoscopic evaluation  Echo 02/26/24 1. Left ventricular ejection fraction, by estimation, is 60 to 65%. The  left ventricle has normal function. The left ventricle has no regional  wall motion abnormalities. Left ventricular diastolic parameters are  consistent with Grade II diastolic dysfunction (pseudonormalization). Elevated left atrial pressure.   2.  Right ventricular systolic function is normal. The right ventricular  size is normal. Tricuspid regurgitation signal is inadequate for assessing  PA pressure.   3. The mitral valve is normal in structure.  Trivial mitral valve  regurgitation. No evidence of mitral stenosis.   4. The aortic valve was not well visualized. Aortic valve regurgitation  is not visualized. No aortic stenosis is present.   5. The inferior vena cava is dilated in size with <50% respiratory  variability, suggesting right atrial pressure of 15 mmHg.   Assessment and Plan   Acute hypoxic respiratory failure Extensive GGO on CT scan  Rhinovirus positive History of SLE on, plaquinil, prednisone, mycophenolate, Benlysta   1 female with history of SLE on Plaquenil, prednisone 10 mg, Mycophenolate, Benlysta , who came flu like symptoms for one week, and shortness of breath. Her CT chest showed extensive bilateral GGO's a dramatic change compared to CT scan on January this year. RVSP is positive for rhinovirus.  Her echo is unrevealing.  I think patient has acute interstitial pneumonia trigger by rhinovirus, in the setting of lupus/immunocompromised patient. I don't think these dramatic changes on CT scan are only related to viral pneumonia, I think there is a big contribution of her autoimmune disease. Other ddx: DAH, acute eosinophilic pneumonia (although less likely with normal eos), acute lung injury, ARDS.  Ideally she will need sequential lavage bronch to rule out DAH, AEP, however I recommend against that given her high O2 requirement.   Plan to high dose CS for 3 days and then transition to long taper of prednisone. She will need PJP prophylaxis. CXR today showed major improvement of airspace disease. She is on 4L [place] walking.  Plan: -continue zosyn for 7 days total (end date 10/25) -Solumedrol 125 mg IV BID for 3 days (10/22-25), then transitioned to prednisone  60 mg (on 03/02/24) for 2 week, then 50 mg for 2 weeks, 40 mg for 2w, 30 mg 2 w, 20 mg 2 weeks, and  continue 10 mg daily at her prior home dose. -Insulin  will need to be adjusted at discharge. -After her renal function improves, I will PJP prophylaxis with Bactrim  1DS, 3 times a week, at the time of discharge.  -Ok to hold cellcept for now, I will resume when she is clinically ready for discharge. -If patient looks fluid overload, ok for lasix , if not, discontinue (unable to assess through elink). -Continue LABA+ICS nebs -Incentive spirometer every 2 hours when she is awake. -On 4L [place] with walking  -Primary team updated.  DKA - resolved  -Adjust insulin  per primary team due to high doses of steroids.  AKI versus CKD: Hold lasix if renal function worsen. Renal function improving  Labs   CBC: Recent Labs  Lab 02/24/24 0528 02/25/24 0728 02/26/24 0835 02/27/24 0442 02/28/24 0318  WBC 17.4* 22.1* 17.1* 13.1* 8.3  NEUTROABS  --   --  15.8* 11.9* 7.5  HGB 9.8* 9.7* 9.4* 9.7* 10.2*  HCT 30.3* 30.5* 29.7* 30.6* 31.7*  MCV 87.3 87.4 87.4 85.7 84.8  PLT 256 290 282 291 304    Basic Metabolic Panel: Recent Labs  Lab 02/24/24 0528 02/25/24 0728 02/26/24 0835 02/27/24 0442 02/28/24 0318  NA 138 141 142 142 138  K 4.1 4.1 3.6 3.7 3.6  CL 108 109 104 103 98  CO2 17* 19* 22 27 28   GLUCOSE 167* 173* 171* 104* 230*  BUN 26* 36* 45* 45* 37*  CREATININE 1.59* 1.33* 1.48* 1.42* 1.25*  CALCIUM   8.3* 8.6* 8.8* 8.8* 8.7*  MG 1.9  --   --   --   --   PHOS 3.8  --   --   --   --    GFR: Estimated Creatinine Clearance: 55.1 mL/min (A) (by C-G formula based on SCr of 1.25 mg/dL (H)). Recent Labs  Lab 02/23/24 0329 02/23/24 0757 02/24/24 0528 02/25/24 0728 02/25/24 1708 02/25/24 1940 02/26/24 0835 02/27/24 0442 02/28/24 0318  WBC 19.2*  --    < > 22.1*  --   --  17.1* 13.1* 8.3  LATICACIDVEN 4.4* 3.7*  --   --  1.1 1.2  --   --   --    < > = values in this interval not displayed.    Liver Function Tests: Recent Labs  Lab 02/24/24 0528 02/26/24 0835 02/27/24 0442 02/28/24 0318  AST 41 19 16 14*  ALT 7 10 10 9   ALKPHOS 79 88 88 95  BILITOT 0.2 0.2 0.2 0.2  PROT 5.3* 6.1* 6.1* 6.0*  ALBUMIN 2.9* 3.3* 3.4* 3.3*   No results  for input(s): LIPASE, AMYLASE in the last 168 hours. No results for input(s): AMMONIA in the last 168 hours.  ABG    Component Value Date/Time   PHART 7.36 02/26/2024 0424   PCO2ART 38 02/26/2024 0424   PO2ART 72 (L) 02/26/2024 0424   HCO3 21.5 02/26/2024 0424   TCO2 25 05/10/2017 2033   ACIDBASEDEF 3.6 (H) 02/26/2024 0424   O2SAT 94.7 02/26/2024 0424      Past Medical History:  She,  has a past medical history of Diabetes mellitus without complication (HCC), Hypertension, and Lupus.   Surgical History:   Past Surgical History:  Procedure Laterality Date   CERVICAL CONIZATION W/BX N/A 09/26/2023   Procedure: CONE BIOPSY, CERVIX;  Surgeon: Jayne Vonn DEL, MD;  Location: [facility];  Service: Gynecology;  Laterality: N/A;   SHOULDER SURGERY Left    UTERINE FIBROID SURGERY     WRIST SURGERY Right      Social History:   reports that she has been smoking cigarettes. She uses smokeless tobacco. She reports that she does not drink alcohol and does not use drugs.   Family History:  Her family history is not on file.   Allergies Allergies  Allergen Reactions   Strawberry Extract Dermatitis    Pt states she was allergic as a child but not so far as an adult.     Home Medications  Prior to Admission medications   Medication Sig Start Date End Date Taking? Authorizing Provider  amLODipine (NORVASC) 2.5 MG tablet Take 2.5 mg by mouth daily. 08/18/23   [provider]  amoxicillin-clavulanate (AUGMENTIN) 875-125 MG tablet Take 1 tablet by mouth every 12 (twelve) hours. 02/08/24   Towana Ozell BROCKS, MD  aspirin EC 81 MG tablet Take 81 mg by mouth daily. Swallow whole.    [provider]  Belimumab  (BENLYSTA  IV) Inject into the vein.    [provider]  HUMALOG 100 UNIT/ML injection Inject 100 Units into the skin daily. Via insulin  pump 01/29/24   [provider]  HYDROcodone -acetaminophen  (NORCO/VICODIN) 5-325 MG tablet Take 1 tablet by mouth  every 6 (six) hours as needed. 09/26/23   Jayne Vonn DEL, MD  hydroxychloroquine (PLAQUENIL) 200 MG tablet Take 200 mg by mouth 2 (two) times daily.    [provider]  ketorolac  (TORADOL ) 10 MG tablet Take 1 tablet (10 mg total) by mouth every 8 (eight) hours as  needed. 09/26/23   Jayne Vonn DEL, MD  Lancets (ACCU-CHEK MULTICLIX) lancets Use as instructed 08/01/14   Dhungel, Nishant, MD  lisinopril  (ZESTRIL ) 20 MG tablet Take 20 mg by mouth daily.    [provider]  LORazepam  (ATIVAN ) 0.5 MG tablet Take 0.5 mg by mouth 2 (two) times daily.    [provider]  medroxyPROGESTERone  (PROVERA ) 10 MG tablet Take 1 tablet (10 mg total) by mouth daily. Patient not taking: Reported on 12/18/2023 08/03/23   Jayne Vonn DEL, MD  mycophenolate (CELLCEPT) 500 MG tablet Take 1,500 mg by mouth 2 (two) times daily.    [provider]  nicotine  (NICODERM CQ  - DOSED IN MG/24 HOURS) 14 mg/24hr patch Place 1 patch (14 mg total) onto the skin daily. 12/18/23   Mannam, Praveen, MD  ondansetron  (ZOFRAN ) 4 MG tablet Take 1 tablet (4 mg total) by mouth every 6 (six) hours as needed for nausea. Patient not taking: Reported on 12/18/2023 05/14/17   Cheryle Page, MD  ondansetron  (ZOFRAN -ODT) 8 MG disintegrating tablet Take 1 tablet (8 mg total) by mouth every 8 (eight) hours as needed for nausea or vomiting. 02/08/24   Towana Ozell BROCKS, MD  oxyCODONE-acetaminophen  (PERCOCET/ROXICET) 5-325 MG tablet Take 1 tablet by mouth every 6 (six) hours as needed for severe pain (pain score 7-10). 02/08/24   Towana Ozell BROCKS, MD  pantoprazole  (PROTONIX ) 40 MG tablet Take 1 tablet (40 mg total) by mouth daily. Patient not taking: Reported on 12/18/2023 05/14/17   Cheryle Page, MD  predniSONE (DELTASONE) 10 MG tablet Take 15 mg by mouth daily with breakfast.    [provider]  pregabalin (LYRICA) 150 MG capsule Take 150 mg by mouth 3 (three) times daily.    [provider]  promethazine   (PHENERGAN ) 25 MG tablet Take 1 tablet (25 mg total) by mouth every 6 (six) hours as needed for nausea or vomiting. Patient not taking: Reported on 12/18/2023 04/24/18   Norris Will PARAS, PA-C  rosuvastatin (CRESTOR) 10 MG tablet Take 10 mg by mouth daily.    [provider]  sertraline  (ZOLOFT ) 100 MG tablet Take 200 mg by mouth daily.    [provider]  Varenicline  Tartrate, Starter, (CHANTIX  STARTING MONTH PAK) 0.5 MG X 11 & 1 MG X 42 TBPK Take 1 tablet by mouth daily. 12/18/23   Mannam, Praveen, MD     CRITICAL CARE Performed by: Marny Patch   Total critical care time: 30 minutes   Critical care time was exclusive of separately billable procedures and treating other patients.   Critical care was necessary to treat or prevent imminent or life-threatening deterioration.   Critical care was time spent personally by me on the following activities: development of treatment plan with patient and/or surrogate as well as nursing, discussions with consultants, evaluation of patient's response to treatment, examination of patient, obtaining history from patient or surrogate, ordering and performing treatments and interventions, ordering and review of laboratory studies, ordering and review of radiographic studies, pulse oximetry, re-evaluation of patient's condition and participation in multidisciplinary rounds.  Marny Patch, MD Pulmonary, Critical Care Pager: [phone]  02/29/2024, 7:48 PM

## 2024-02-29 NOTE — Progress Notes (Signed)
 PROGRESS NOTE    Patient: [name]                            PCP: Patient, No Pcp Per                    DOB: 1980-06-14            DOA: 02/22/2024 FMW:[id number]             DOS: 02/29/2024, 1:05 PM   LOS: 6 days   Date of Service: The patient was seen and examined on 02/29/2024  Subjective:   The patient was seen and examined, Marked improvement from 20 L to 15 L, this morning on 5 L of oxygen satting 98%   Brief Narrative:   [name] is a 43 y.o. female with medical history significant of Lupus, Type 1 diabetes, cervical HSIL,  and hypertension who was brought to the ED via EMS from local fire station endorsing few days of productive cough, increasing SOB this evening. Tried to drive here but had to stop at [facility] where she was reportedly hypoxic in the low 80s on room air. Given nebs, solumedrol and IVF and started on supplemental oxygen with some improvement.  Review of labs shows patient with elevated white count, elevated lactic acid level and multifocal pneumonia on xray consistent with sepsis. Blood cultures and ua collected tin ED. Elevated glucose with anion gap acidosis, BHA pending, loikely DKA   Assessment & Plan:   Principal Problem:   Sepsis due to pneumonia [facility]) Active Problems:   Diabetic ketoacidosis without coma associated with type 1 diabetes mellitus (HCC)   Multifocal pneumonia   Acute respiratory failure with hypoxia (HCC) Acute respiratory failure    Sepsis secondary to multifocal pneumonia- Exacerbated with acute respiratory failure -Improving sepsis physiology  -Viral panel positive for rhinovirus, enterovirus -Immunocompromised with a history of lupus - On IV antibiotics, IV steroids, respiratory support, via heated high flow oxygen - Escalated high-dose of IV steroids and IV antibiotics per pulmonary recommendations  - Continue mucolytics, bronchodilator management and follow culture results - Given the negative MRSA vancomycin  discontinued. - Improving leukocytosis   Acute respiratory failure with hypoxia: Improving -was satting 91% on HHFNC  20 L>> 15L -- FiO2 45>>40 % >>> de-escalated to 5 L, currently satting 94%  - In the setting of multifocal pneumonia most likely component of COPD/asthma/rhinovirus enterovirus  -Continue IV steroids was escalated up per PCCM recommendation - Continue antibiotics, nebs, Pulmicort -Discussed with pulmonary critical care team at [facility] Dr. Winfred  -Increasing Solu-Medrol back 125 mg IV twice daily, increasing long-acting insulin , To taper off to oral prednisone this weekend  -10/19/ Cx-ray consistent with diffuse infusion, consistent with multifocal pneumonia - IV Lasix 40 mg twice daily, changed to daily due to elevated creatinine-creatinine stabilized  - Continue Brovana  - Continue the use of incentive spirometer and flutter valve. -Pulmonary/critical care team consulted -following closely  Type I DKA - Resolved -monitoring hyperglycemic state due to steroids -Adding long-acting insulin  Semglee  15 units >>> increasing to 20>>>25  units CBG (last 3)  Recent Labs    02/29/24 0445 02/29/24 0756 02/29/24 1152  GLUCAP 167* 139* 197*   -Continue checking CBG q. ACHS, SSI coverage - Home insulin  pump-on hold - Most recent A1c of 9.6 demonstrating uncontrolled diabetes.   GERD/GI prophylaxis  - Continue PPI.   Acute kidney injury on chronic kidney  disease stage III A at baseline. - Continue to maintain adequate hydration - Minimize nephrotoxic agents - Follow renal function trend Lab Results  Component Value Date   CREATININE 1.25 (H) 02/28/2024   CREATININE 1.42 (H) 02/27/2024   CREATININE 1.48 (H) 02/26/2024   - on IV Lasix     History of lupus - Holding immunosuppressive agents at the moment - Patient on chronic steroids; stress dosages currently satisfied with the use of his steroids for reactive airway disease/chronic obstructive pulmonary disease  exacerbation. -Adding Bactrim prophylaxis treatment   Obesity-overweight - Low-calorie diet and portion control discussed with patient -Body mass index is 29.29 kg/m     ----------------------------------------------------------------------------------------------------------------------------------------------- Nutritional status:  The patient's BMI is: Body mass index is 29.29 kg/m. I agree with the assessment and plan as outlined  Discussed regarding healthy exercise, healthy diet, diabetic diet Weight loss programs   ------------------------------------------------------------------------------------------------------------------------------------------------- Cultures; Respiratory panel positive for rhinovirus/   ------------------------------------------------------------------------------------------------------------------------------------------------  DVT prophylaxis:  heparin  injection 5,000 Units Start: 02/23/24 0600 SCDs Start: 02/23/24 0506   Code Status:   Code Status: Full Code  Family Communication: Husband present at bedside updated -Advance care planning has been discussed.   Admission status:   Status is: Inpatient Remains inpatient appropriate because: Needing respiratory support, IV steroids, nebs,   Disposition: From  - home             Planning for discharge in 2- 3 days  Will remain in ICU setting as patient continues to need heated high flow oxygen to maintain O2 sat  Procedures:   No admission procedures for hospital encounter.   Antimicrobials:  Anti-infectives (From admission, onward)    Start     Dose/Rate Route Frequency Ordered Stop   02/27/24 1400  piperacillin-tazobactam (ZOSYN) IVPB 3.375 g        3.375 g 12.5 mL/hr over 240 Minutes Intravenous Every 8 hours 02/27/24 1303 03/01/24 1359   02/23/24 2200  vancomycin (VANCOCIN) IVPB 1000 mg/200 mL premix  Status:  Discontinued        1,000 mg 200 mL/hr over 60 Minutes  Intravenous Every 24 hours 02/23/24 0414 02/24/24 1425   02/23/24 0330  piperacillin-tazobactam (ZOSYN) IVPB 3.375 g  Status:  Discontinued        3.375 g 12.5 mL/hr over 240 Minutes Intravenous Every 8 hours 02/23/24 0307 02/27/24 1303   02/23/24 0315  vancomycin (VANCOREADY) IVPB 1500 mg/300 mL        1,500 mg 150 mL/hr over 120 Minutes Intravenous  Once 02/23/24 0307 02/23/24 0620   02/23/24 0015  cefTRIAXone  (ROCEPHIN ) 2 g in sodium chloride  0.9 % 100 mL IVPB        2 g 200 mL/hr over 30 Minutes Intravenous Once 02/23/24 0014 02/23/24 0144   02/23/24 0015  azithromycin  (ZITHROMAX ) 500 mg in sodium chloride  0.9 % 250 mL IVPB  Status:  Discontinued        500 mg 250 mL/hr over 60 Minutes Intravenous  Once 02/23/24 0014 02/23/24 0409        Medication:   amLODipine  5 mg Oral Daily   arformoterol  15 mcg Nebulization BID   ascorbic acid  500 mg Oral Daily   aspirin EC  81 mg Oral Daily   benzonatate  200 mg Oral TID   budesonide (PULMICORT) nebulizer solution  0.5 mg Nebulization BID   Chlorhexidine  Gluconate Cloth  6 each Topical Daily   dextromethorphan-guaiFENesin  1 tablet Oral BID   docusate sodium  100 mg  Oral BID   furosemide  40 mg Intravenous Daily   heparin   5,000 Units Subcutaneous Q8H   insulin  aspart  0-20 Units Subcutaneous Q4H   insulin  glargine-yfgn  25 Units Subcutaneous Daily   ipratropium-albuterol  3 mL Nebulization Q6H   lidocaine   1 patch Transdermal Q24H   lisinopril   20 mg Oral Daily   methylPREDNISolone (SOLU-MEDROL) injection  125 mg Intravenous Q12H   pantoprazole  (PROTONIX ) IV  40 mg Intravenous Q24H   polyethylene glycol  17 g Oral Daily   [START ON 03/02/2024] predniSONE  60 mg Oral Q breakfast   rosuvastatin  10 mg Oral Daily   sodium chloride  flush  10-40 mL Intracatheter Q12H   zinc sulfate (50mg  elemental zinc)  220 mg Oral Daily    acetaminophen , bisacodyl, chlorpheniramine-HYDROcodone , dextrose , diphenhydrAMINE ,  guaiFENesin-dextromethorphan, HYDROmorphone  (DILAUDID ) injection, melatonin, ondansetron  (ZOFRAN ) IV, mouth rinse, oxyCODONE, prochlorperazine, sodium chloride  flush   Objective:   Vitals:   02/29/24 0443 02/29/24 0759 02/29/24 0800 02/29/24 0802  BP: (!) 142/68  (!) 141/68   Pulse: 64  74   Resp: 17     Temp: 97.9 F (36.6 C)  98.3 F (36.8 C)   TempSrc: Oral  Oral   SpO2: 98% 94% (!) 88% 98%  Weight:      Height:        Intake/Output Summary (Last 24 hours) at 02/29/2024 1305 Last data filed at 02/29/2024 0800 Gross per 24 hour  Intake 1092.23 ml  Output --  Net 1092.23 ml    Filed Weights   02/22/24 2314  Weight: 75 kg     Physical examination:       General:  AAO x 3,  cooperative, no distress;   HEENT:  Normocephalic, PERRL, otherwise with in Normal limits   Neuro:  CNII-XII intact. , normal motor and sensation, reflexes intact   Lungs:   No labored breathing, extensive diffuse rhonchi, with crackles, Improved breath sounds  Cardio:    S1/S2, RRR, No murmure, No Rubs or Gallops   Abdomen:  Soft, non-tender, bowel sounds active all four quadrants, no guarding or peritoneal signs.  Muscular  skeletal:  Limited exam -global generalized weaknesses - in bed, able to move all 4 extremities,   2+ pulses,  symmetric, No pitting edema  Skin:  Dry, warm to touch, negative for any Rashes,  Wounds: Please see nursing documentation         --------------------------------------------------------------------------------------------------------------------------    LABs:     Latest Ref Rng & Units 02/28/2024    3:18 AM 02/27/2024    4:42 AM 02/26/2024    8:35 AM  CBC  WBC 4.0 - 10.5 K/uL 8.3  13.1  17.1   Hemoglobin 12.0 - 15.0 g/dL 89.7  9.7  9.4   Hematocrit 36.0 - 46.0 % 31.7  30.6  29.7   Platelets 150 - 400 K/uL 304  291  282       Latest Ref Rng & Units 02/28/2024    3:18 AM 02/27/2024    4:42 AM 02/26/2024    8:35 AM  CMP  Glucose 70 - 99  mg/dL 769  895  828   BUN 6 - 20 mg/dL 37  45  45   Creatinine 0.44 - 1.00 mg/dL 8.74  8.57  8.51   Sodium 135 - 145 mmol/L 138  142  142   Potassium 3.5 - 5.1 mmol/L 3.6  3.7  3.6   Chloride 98 - 111 mmol/L 98  103  104   CO2 22 - 32 mmol/L 28  27  22    Calcium  8.9 - 10.3 mg/dL 8.7  8.8  8.8   Total Protein 6.5 - 8.1 g/dL 6.0  6.1  6.1   Total Bilirubin 0.0 - 1.2 mg/dL 0.2  0.2  0.2   Alkaline Phos 38 - 126 U/L 95  88  88   AST 15 - 41 U/L 14  16  19    ALT 0 - 44 U/L 9  10  10         Micro Results Recent Results (from the past 240 hours)  Culture, blood (routine x 2)     Status: None   Collection Time: 02/23/24 12:53 AM   Specimen: Left Antecubital; Blood  Result Value Ref Range Status   Specimen Description LEFT ANTECUBITAL  Final   Special Requests   Final    BOTTLES DRAWN AEROBIC AND ANAEROBIC Blood Culture adequate volume   Culture   Final    NO GROWTH 5 DAYS Performed at [facility], [address], [place], [place] [address]    Report Status 02/28/2024 FINAL  Final  Culture, blood (routine x 2)     Status: None   Collection Time: 02/23/24 12:57 AM   Specimen: Left Antecubital; Blood  Result Value Ref Range Status   Specimen Description LEFT ANTECUBITAL  Final   Special Requests   Final    BOTTLES DRAWN AEROBIC AND ANAEROBIC Blood Culture adequate volume   Culture   Final    NO GROWTH 5 DAYS Performed at [facility], [address] [place], [place] [address]    Report Status 02/28/2024 FINAL  Final  MRSA Next Gen by PCR, Nasal     Status: None   Collection Time: 02/23/24  3:04 AM   Specimen: Nasal Mucosa; Nasal Swab  Result Value Ref Range Status   MRSA by PCR Next Gen NOT DETECTED NOT DETECTED Final    Comment: (NOTE) The GeneXpert MRSA Assay (FDA approved for NASAL specimens only), is one component of a comprehensive MRSA colonization surveillance program. It is not intended to diagnose MRSA infection nor to guide or monitor treatment for MRSA  infections. Test performance is not FDA approved in patients less than 88 years old. Performed at [facility], [address], [place], [place] [address]   Respiratory (~20 pathogens) panel by PCR     Status: Abnormal   Collection Time: 02/23/24  6:54 AM   Specimen: Nasopharyngeal Swab; Respiratory  Result Value Ref Range Status   Adenovirus NOT DETECTED NOT DETECTED Final   Coronavirus 229E NOT DETECTED NOT DETECTED Final    Comment: (NOTE) The Coronavirus on the Respiratory Panel, DOES NOT test for the novel  Coronavirus (2019 nCoV)    Coronavirus HKU1 NOT DETECTED NOT DETECTED Final   Coronavirus NL63 NOT DETECTED NOT DETECTED Final   Coronavirus OC43 NOT DETECTED NOT DETECTED Final   Metapneumovirus NOT DETECTED NOT DETECTED Final   Rhinovirus / Enterovirus DETECTED (A) NOT DETECTED Final   Influenza A NOT DETECTED NOT DETECTED Final   Influenza B NOT DETECTED NOT DETECTED Final   Parainfluenza Virus 1 NOT DETECTED NOT DETECTED Final   Parainfluenza Virus 2 NOT DETECTED NOT DETECTED Final   Parainfluenza Virus 3 NOT DETECTED NOT DETECTED Final   Parainfluenza Virus 4 NOT DETECTED NOT DETECTED Final   Respiratory Syncytial Virus NOT DETECTED NOT DETECTED Final   Bordetella pertussis NOT DETECTED NOT DETECTED Final   Bordetella Parapertussis NOT DETECTED  NOT DETECTED Final   Chlamydophila pneumoniae NOT DETECTED NOT DETECTED Final   Mycoplasma pneumoniae NOT DETECTED NOT DETECTED Final    Comment: Performed at [facility], [address] [place], [place] [address]    Radiology Reports DG CHEST PORT 1 VIEW Result Date: 02/29/2024 EXAM: 1 VIEW(S) XRAY OF THE CHEST 02/29/2024 04:07:00 AM COMPARISON: 02/25/2024 CLINICAL HISTORY: ILD (interstitial lung disease) (HCC) 831397. 3rd SHIFT MORNING ROUTINE FOLLOW-UP IMAGE timed for 0600 FOR: ILD (interstitial lung disease) FINDINGS: LINES, TUBES AND DEVICES: Left upper extremity PICC in place with tip at superior cavoatrial  junction. LUNGS AND PLEURA: Improved lung aeration with decreased diffuse interstitial/airspace opacities. Persistent left lower lung nodular opacity. Bilateral blunting of costophrenic angles, suggesting small pleural effusions. No pulmonary edema. No pneumothorax. HEART AND MEDIASTINUM: No acute abnormality of the cardiac and mediastinal silhouettes. BONES AND SOFT TISSUES: Old right rib fractures. No acute osseous abnormality. IMPRESSION: 1. Improved lung aeration with decreased diffuse interstitial and airspace opacities. 2. Persistent left lower lung nodular opacity, for which follow-up chest radiograph is advised in 2-3 months. 3. Similar small bilateral pleural effusions. Electronically signed by: Selinda Blue MD 02/29/2024 08:48 AM EDT RP Workstation: [id number]     SIGNED: Adriana DELENA Grams, MD, FHM. FAAFP. [name] - [facility] Time spent - 55 min.  In seeing, evaluating and examining the patient. Reviewing medical records, labs, drawn plan of care. [facility],  Pager (please use [email or web address] to page/ text) Please use Epic Secure Chat for non-urgent communication (7AM-7PM)  If 7PM-7AM, please contact night-coverage [email or web address], 02/29/2024, 1:05 PM

## 2024-02-29 NOTE — Plan of Care (Signed)
 She alert, oriented x 4. She is afebrile, stable hemodynamically, NSR on the monitor. On 5 LPM of NCL, She is still having congested cough with crackles on auscultations bilaterally. SPO2 97-99%, no acute distress. Pain is well tolerated. She is able to rest well overnight. Plan of care is reviewed. Pt has been progressing. We will continue to monitor.   Problem: Clinical Measurements: Goal: Ability to maintain clinical measurements within normal limits will improve Outcome: Progressing Goal: Will remain free from infection Outcome: Progressing Goal: Diagnostic test results will improve Outcome: Progressing Goal: Respiratory complications will improve Outcome: Progressing Goal: Cardiovascular complication will be avoided Outcome: Progressing   Problem: Activity: Goal: Risk for activity intolerance will decrease Outcome: Progressing   Problem: Nutrition: Goal: Adequate nutrition will be maintained Outcome: Progressing   Problem: Coping: Goal: Level of anxiety will decrease Outcome: Progressing   Problem: Elimination: Goal: Will not experience complications related to bowel motility Outcome: Progressing Goal: Will not experience complications related to urinary retention Outcome: Progressing   Problem: Pain Managment: Goal: General experience of comfort will improve and/or be controlled Outcome: Progressing   Problem: Skin Integrity: Goal: Risk for impaired skin integrity will decrease Outcome: Progressing   Problem: Safety: Goal: Ability to remain free from injury will improve Outcome: Progressing   Problem: Education: Goal: Ability to describe self-care measures that may prevent or decrease complications (Diabetes Survival Skills Education) will improve Outcome: Progressing Goal: Individualized Educational Video(s) Outcome: Progressing   Problem: Metabolic: Goal: Ability to maintain appropriate glucose levels will improve Outcome: Progressing   Problem:  Nutritional: Goal: Maintenance of adequate nutrition will improve Outcome: Progressing Goal: Progress toward achieving an optimal weight will improve Outcome: Progressing   Problem: Skin Integrity: Goal: Risk for impaired skin integrity will decrease Outcome: Progressing   Problem: Respiratory: Goal: Will regain and/or maintain adequate ventilation Outcome: Progressing   Wendi Dash, RN

## 2024-03-01 DIAGNOSIS — J189 Pneumonia, unspecified organism: Secondary | ICD-10-CM | POA: Diagnosis not present

## 2024-03-01 DIAGNOSIS — A419 Sepsis, unspecified organism: Secondary | ICD-10-CM | POA: Diagnosis not present

## 2024-03-01 LAB — GLUCOSE, CAPILLARY
Glucose-Capillary: 177 mg/dL — ABNORMAL HIGH (ref 70–99)
Glucose-Capillary: 304 mg/dL — ABNORMAL HIGH (ref 70–99)
Glucose-Capillary: 321 mg/dL — ABNORMAL HIGH (ref 70–99)
Glucose-Capillary: 178 mg/dL — ABNORMAL HIGH (ref 70–99)
Glucose-Capillary: 224 mg/dL — ABNORMAL HIGH (ref 70–99)

## 2024-03-01 MED ORDER — FLUTICASONE PROPIONATE 50 MCG/ACT NA SUSP
2.0000 | Freq: Every day | NASAL | Status: DC
  Administered 2024-03-01: 2 via NASAL
  Filled 2024-03-01: qty 16

## 2024-03-01 MED ORDER — OXYMETAZOLINE HCL 0.05 % NA SOLN
1.0000 | Freq: Two times a day (BID) | NASAL | Status: DC
Start: 2024-03-01 — End: 2024-03-04
  Administered 2024-03-01 – 2024-03-02 (×2): 1 via NASAL
  Filled 2024-03-01 (×2): qty 30

## 2024-03-01 MED ORDER — FLUTICASONE PROPIONATE 50 MCG/ACT NA SUSP
2.0000 | Freq: Every day | NASAL | Status: DC
  Administered 2024-03-02: 2 via NASAL

## 2024-03-01 MED ORDER — INSULIN ASPART 100 UNIT/ML IJ SOLN: 3.0000 [IU] | Freq: Three times a day (TID) | INTRAMUSCULAR | Status: DC

## 2024-03-01 MED ORDER — FLUTICASONE PROPIONATE 50 MCG/ACT NA SUSP: 2.0000 | Freq: Every day | NASAL | Status: DC

## 2024-03-01 NOTE — Inpatient Diabetes Management (Addendum)
 Inpatient Diabetes Program Recommendations  AACE/ADA: New Consensus Statement on Inpatient Glycemic Control  Target Ranges:  Prepandial:   less than 140 mg/dL      Peak postprandial:   less than 180 mg/dL (1-2 hours)      Critically ill patients:  140 - 180 mg/dL    Latest Reference Range & Units 02/29/24 07:56 02/29/24 11:52 02/29/24 16:57 02/29/24 17:35 02/29/24 20:31 02/29/24 23:36 03/01/24 03:28 03/01/24 07:43  Glucose-Capillary 70 - 99 mg/dL 860 (H) 802 (H) 640 (H) 425 (H) 387 (H) 217 (H) 177 (H) 304 (H)   Review of Glycemic Control  Diabetes history: DM1 Outpatient Diabetes medications: Insulin  Pump with Humalog Current orders for Inpatient glycemic control: Semglee  25 units daily, Novolog  0-20 units Q4H; Prednisone 60 mg QAM (was Solumedrol 125 mg Q12H)  Inpatient Diabetes Program Recommendations:    Insulin : Noted steroids decreased today and last dose of Solumedrol 125 mg was today at 8:44 am.   If steroids are continued, please consider ordering Novolog  6 units TID with meals for meal coverage if patient eats at least 50% of meals, changing CBGs to AC&HS and decreasing Novolog  correction to 0-15 units AC&HS.  NOTE: Noted consult for Diabetes Coordinator. Diabetes Coordinator is not on campus over the weekend but available by pager from 8am to 5pm for questions or concerns.   Thanks, Earnie Gainer, RN, MSN, CDCES Diabetes Coordinator Inpatient Diabetes Program [phone] (Team Pager from 8am to 5pm)

## 2024-03-01 NOTE — Progress Notes (Signed)
 PROGRESS NOTE  [name] FMW:[id number] DOB: 16-Nov-1980 DOA: 02/22/2024 PCP: Patient, No Pcp Per   LOS: 7 days   Brief narrative:  [name] is a 43 y.o. female with medical history significant of Lupus, Type 1 diabetes, cervical HSIL,  and hypertension presented to hospital with productive cough and increased shortness of breath.  [place] fire [place] when she was noted to be hypoxic with pulse ox of 80s and room air.  He was given nebs Solu-Medrol IV fluids and was brought into the hospital.  In the ED patient had leukocytosis with elevated lactate and chest x-ray showed multifocal pneumonia.  Patient was also noted to have elevated glucose with anion gap and was admitted to hospital for further evaluation and treatment.   Assessment/Plan: Principal Problem:   Sepsis due to pneumonia [facility]) Active Problems:   Diabetic ketoacidosis without coma associated with type 1 diabetes mellitus (HCC)   Multifocal pneumonia   Acute respiratory failure with hypoxia (HCC)  Sepsis secondary to multifocal pneumonia-  Respiratory viral panel positive for rhinovirus, enterovirus. -Immunocompromised with a history of lupus.  On IV antibiotics, steroids, respiratory support.  Continue  mucolytics, bronchodilators.  Leukocytosis has normalized.    Acute respiratory failure with hypoxia secondary to multifocal pneumonia.: Overall improving.  Still on 4 L of oxygen by nasal cannula.  Was on IV steroids and currently transition to oral steroid.  Seen by pulmonary as well.  Continue Brovana incentive spirometry flutter valve.  Continue diuretics as well.  Continue to wean oxygen as able.    Type I DKA with hyperglycemia Resolved but hyperglycemic due to steroids.  Dose of Semglee  has been increased to 25 units from 15.  Continue sliding scale insulin  Q4 hourly, Accu-Cheks.  Patient is on insulin  pump at home which is on hold.  Most recent hemoglobin A1c was 9.6.   GERD/GI  prophylaxis  - Continue PPI.   Acute kidney injury on chronic kidney disease stage III A On IV Lasix.  Creatinine has now improved.  Latest creatinine of 1.2.  Check BMP in AM.   History of lupus Immunosuppressive agents on hold.  On chronic steroids.  Currently on prednisone followed by IV steroids.     overweight Body mass index is 29.29 kg/m.  Would benefit from lifestyle modification.  DVT prophylaxis: heparin  injection 5,000 Units Start: 02/23/24 0600 SCDs Start: 02/23/24 9493   Disposition: Home likely in 1 to 2 days  Status is: Inpatient Remains inpatient appropriate because: Pending clinical improvement,  steroids, on supplemental oxygen    Code Status:     Code Status: Full Code  Family Communication: Spoke with the patient's husband at bedside  Consultants: None  Procedures: None  Anti-infectives:  Zosyn IV  Anti-infectives (From admission, onward)    Start     Dose/Rate Route Frequency Ordered Stop   02/27/24 1400  piperacillin-tazobactam (ZOSYN) IVPB 3.375 g        3.375 g 12.5 mL/hr over 240 Minutes Intravenous Every 8 hours 02/27/24 1303 03/01/24 0953   02/23/24 2200  vancomycin (VANCOCIN) IVPB 1000 mg/200 mL premix  Status:  Discontinued        1,000 mg 200 mL/hr over 60 Minutes Intravenous Every 24 hours 02/23/24 0414 02/24/24 1425   02/23/24 0330  piperacillin-tazobactam (ZOSYN) IVPB 3.375 g  Status:  Discontinued        3.375 g 12.5 mL/hr over 240 Minutes Intravenous Every 8 hours 02/23/24 0307 02/27/24 1303  02/23/24 0315  vancomycin (VANCOREADY) IVPB 1500 mg/300 mL        1,500 mg 150 mL/hr over 120 Minutes Intravenous  Once 02/23/24 0307 02/23/24 0620   02/23/24 0015  cefTRIAXone  (ROCEPHIN ) 2 g in sodium chloride  0.9 % 100 mL IVPB        2 g 200 mL/hr over 30 Minutes Intravenous Once 02/23/24 0014 02/23/24 0144   02/23/24 0015  azithromycin  (ZITHROMAX ) 500 mg in sodium chloride  0.9 % 250 mL IVPB  Status:  Discontinued        500 mg 250  mL/hr over 60 Minutes Intravenous  Once 02/23/24 0014 02/23/24 0409        Subjective: Today, patient was seen and examined at bedside.  Patient states that she feels a little better with breathing but still has some cough congestion shortness of breath and wheezing.  Feels like she has been having a lot of sinus drainage with some blocked ear feeling..  Objective: Vitals:   02/29/24 2032 03/01/24 0332  BP: (!) 147/66 (!) 148/70  Pulse: 70 74  Resp:    Temp: 98.2 F (36.8 C) 97.8 F (36.6 C)  SpO2: 96% 100%    Intake/Output Summary (Last 24 hours) at 03/01/2024 1111 Last data filed at 02/29/2024 1439 Gross per 24 hour  Intake 290 ml  Output --  Net 290 ml   Filed Weights   02/22/24 2314  Weight: 75 kg   Body mass index is 29.29 kg/m.   Physical Exam:  GENERAL: Patient is alert awake and oriented. Not in obvious distress. on nasal cannula oxygen HENT: No scleral pallor or icterus. Pupils equally reactive to light. Oral mucosa is moist NECK: is supple, no gross swelling noted. CHEST: Diminished breath sounds bilaterally.  Mild wheezes noted. CVS: S1 and S2 heard, no murmur. Regular rate and rhythm.  ABDOMEN: Soft, non-tender, bowel sounds are present. EXTREMITIES: Right leg with pitting edema. CNS: Cranial nerves are intact. No focal motor deficits. SKIN: warm and dry without rashes.  Data Review: I have personally reviewed the following laboratory data and studies,  CBC: Recent Labs  Lab 02/24/24 0528 02/25/24 0728 02/26/24 0835 02/27/24 0442 02/28/24 0318  WBC 17.4* 22.1* 17.1* 13.1* 8.3  NEUTROABS  --   --  15.8* 11.9* 7.5  HGB 9.8* 9.7* 9.4* 9.7* 10.2*  HCT 30.3* 30.5* 29.7* 30.6* 31.7*  MCV 87.3 87.4 87.4 85.7 84.8  PLT 256 290 282 291 304   Basic Metabolic Panel: Recent Labs  Lab 02/24/24 0528 02/25/24 0728 02/26/24 0835 02/27/24 0442 02/28/24 0318  NA 138 141 142 142 138  K 4.1 4.1 3.6 3.7 3.6  CL 108 109 104 103 98  CO2 17* 19* 22 27 28    GLUCOSE 167* 173* 171* 104* 230*  BUN 26* 36* 45* 45* 37*  CREATININE 1.59* 1.33* 1.48* 1.42* 1.25*  CALCIUM  8.3* 8.6* 8.8* 8.8* 8.7*  MG 1.9  --   --   --   --   PHOS 3.8  --   --   --   --    Liver Function Tests: Recent Labs  Lab 02/24/24 0528 02/26/24 0835 02/27/24 0442 02/28/24 0318  AST 41 19 16 14*  ALT 7 10 10 9   ALKPHOS 79 88 88 95  BILITOT 0.2 0.2 0.2 0.2  PROT 5.3* 6.1* 6.1* 6.0*  ALBUMIN 2.9* 3.3* 3.4* 3.3*   No results for input(s): LIPASE, AMYLASE in the last 168 hours. No results for input(s): AMMONIA in the last  168 hours. Cardiac Enzymes: No results for input(s): CKTOTAL, CKMB, CKMBINDEX, TROPONINI in the last 168 hours. BNP (last 3 results) No results for input(s): BNP in the last 8760 hours.  ProBNP (last 3 results) Recent Labs    02/26/24 0835 02/27/24 0442 02/28/24 0318  PROBNP 942.0* 832.0* 468.0*    CBG: Recent Labs  Lab 02/29/24 2031 02/29/24 2336 03/01/24 0328 03/01/24 0743 03/01/24 1058  GLUCAP 387* 217* 177* 304* 321*   Recent Results (from the past 240 hours)  Culture, blood (routine x 2)     Status: None   Collection Time: 02/23/24 12:53 AM   Specimen: Left Antecubital; Blood  Result Value Ref Range Status   Specimen Description LEFT ANTECUBITAL  Final   Special Requests   Final    BOTTLES DRAWN AEROBIC AND ANAEROBIC Blood Culture adequate volume   Culture   Final    NO GROWTH 5 DAYS Performed at [facility], [address] [place], [place] [address]    Report Status 02/28/2024 FINAL  Final  Culture, blood (routine x 2)     Status: None   Collection Time: 02/23/24 12:57 AM   Specimen: Left Antecubital; Blood  Result Value Ref Range Status   Specimen Description LEFT ANTECUBITAL  Final   Special Requests   Final    BOTTLES DRAWN AEROBIC AND ANAEROBIC Blood Culture adequate volume   Culture   Final    NO GROWTH 5 DAYS Performed at [facility], [address], [place], [place] [address]    Report  Status 02/28/2024 FINAL  Final  MRSA Next Gen by PCR, Nasal     Status: None   Collection Time: 02/23/24  3:04 AM   Specimen: Nasal Mucosa; Nasal Swab  Result Value Ref Range Status   MRSA by PCR Next Gen NOT DETECTED NOT DETECTED Final    Comment: (NOTE) The GeneXpert MRSA Assay (FDA approved for NASAL specimens only), is one component of a comprehensive MRSA colonization surveillance program. It is not intended to diagnose MRSA infection nor to guide or monitor treatment for MRSA infections. Test performance is not FDA approved in patients less than 64 years old. Performed at [facility], [address], [place], [place] [address]   Respiratory (~20 pathogens) panel by PCR     Status: Abnormal   Collection Time: 02/23/24  6:54 AM   Specimen: Nasopharyngeal Swab; Respiratory  Result Value Ref Range Status   Adenovirus NOT DETECTED NOT DETECTED Final   Coronavirus 229E NOT DETECTED NOT DETECTED Final    Comment: (NOTE) The Coronavirus on the Respiratory Panel, DOES NOT test for the novel  Coronavirus (2019 nCoV)    Coronavirus HKU1 NOT DETECTED NOT DETECTED Final   Coronavirus NL63 NOT DETECTED NOT DETECTED Final   Coronavirus OC43 NOT DETECTED NOT DETECTED Final   Metapneumovirus NOT DETECTED NOT DETECTED Final   Rhinovirus / Enterovirus DETECTED (A) NOT DETECTED Final   Influenza A NOT DETECTED NOT DETECTED Final   Influenza B NOT DETECTED NOT DETECTED Final   Parainfluenza Virus 1 NOT DETECTED NOT DETECTED Final   Parainfluenza Virus 2 NOT DETECTED NOT DETECTED Final   Parainfluenza Virus 3 NOT DETECTED NOT DETECTED Final   Parainfluenza Virus 4 NOT DETECTED NOT DETECTED Final   Respiratory Syncytial Virus NOT DETECTED NOT DETECTED Final   Bordetella pertussis NOT DETECTED NOT DETECTED Final   Bordetella Parapertussis NOT DETECTED NOT DETECTED Final   Chlamydophila pneumoniae NOT DETECTED NOT DETECTED Final   Mycoplasma pneumoniae NOT DETECTED  NOT DETECTED Final    Comment:  Performed at [facility], [address], [place], [place] [address]     Studies: DG CHEST PORT 1 VIEW Result Date: 02/29/2024 EXAM: 1 VIEW(S) XRAY OF THE CHEST 02/29/2024 04:07:00 AM COMPARISON: 02/25/2024 CLINICAL HISTORY: ILD (interstitial lung disease) (HCC) [id number]. 3rd SHIFT MORNING ROUTINE FOLLOW-UP IMAGE timed for 0600 FOR: ILD (interstitial lung disease) FINDINGS: LINES, TUBES AND DEVICES: Left upper extremity PICC in place with tip at superior cavoatrial junction. LUNGS AND PLEURA: Improved lung aeration with decreased diffuse interstitial/airspace opacities. Persistent left lower lung nodular opacity. Bilateral blunting of costophrenic angles, suggesting small pleural effusions. No pulmonary edema. No pneumothorax. HEART AND MEDIASTINUM: No acute abnormality of the cardiac and mediastinal silhouettes. BONES AND SOFT TISSUES: Old right rib fractures. No acute osseous abnormality. IMPRESSION: 1. Improved lung aeration with decreased diffuse interstitial and airspace opacities. 2. Persistent left lower lung nodular opacity, for which follow-up chest radiograph is advised in 2-3 months. 3. Similar small bilateral pleural effusions. Electronically signed by: Selinda Blue MD 02/29/2024 08:48 AM EDT RP Workstation: [id number]      Vernal Alstrom, MD  [facility] 03/01/2024  If 7PM-7AM, please contact night-coverage

## 2024-03-01 NOTE — Plan of Care (Signed)

## 2024-03-02 ENCOUNTER — Telehealth: Payer: Self-pay

## 2024-03-02 DIAGNOSIS — E1010 Type 1 diabetes mellitus with ketoacidosis without coma: Secondary | ICD-10-CM | POA: Diagnosis not present

## 2024-03-02 DIAGNOSIS — J189 Pneumonia, unspecified organism: Secondary | ICD-10-CM | POA: Diagnosis not present

## 2024-03-02 DIAGNOSIS — J188 Other pneumonia, unspecified organism: Secondary | ICD-10-CM | POA: Diagnosis not present

## 2024-03-02 DIAGNOSIS — J9601 Acute respiratory failure with hypoxia: Secondary | ICD-10-CM | POA: Diagnosis not present

## 2024-03-02 LAB — GLUCOSE, CAPILLARY
Glucose-Capillary: 127 mg/dL — ABNORMAL HIGH (ref 70–99)
Glucose-Capillary: 149 mg/dL — ABNORMAL HIGH (ref 70–99)
Glucose-Capillary: 173 mg/dL — ABNORMAL HIGH (ref 70–99)
Glucose-Capillary: 188 mg/dL — ABNORMAL HIGH (ref 70–99)

## 2024-03-02 LAB — CBC
HCT: 32.9 % — ABNORMAL LOW (ref 36.0–46.0)
Hemoglobin: 10.3 g/dL — ABNORMAL LOW (ref 12.0–15.0)
MCH: 27 pg (ref 26.0–34.0)
MCHC: 31.3 g/dL (ref 30.0–36.0)
MCV: 86.1 fL (ref 80.0–100.0)
Platelets: 376 K/uL (ref 150–400)
RBC: 3.82 MIL/uL — ABNORMAL LOW (ref 3.87–5.11)
RDW: 14.6 % (ref 11.5–15.5)
WBC: 14.6 K/uL — ABNORMAL HIGH (ref 4.0–10.5)
nRBC: 0 % (ref 0.0–0.2)

## 2024-03-02 LAB — BASIC METABOLIC PANEL WITH GFR
Anion gap: 11 (ref 5–15)
BUN: 37 mg/dL — ABNORMAL HIGH (ref 6–20)
CO2: 28 mmol/L (ref 22–32)
Calcium: 8.4 mg/dL — ABNORMAL LOW (ref 8.9–10.3)
Chloride: 100 mmol/L (ref 98–111)
Creatinine, Ser: 1.21 mg/dL — ABNORMAL HIGH (ref 0.44–1.00)
GFR, Estimated: 56 mL/min — ABNORMAL LOW (ref >60)
Glucose, Bld: 182 mg/dL — ABNORMAL HIGH (ref 70–99)
Potassium: 3.1 mmol/L — ABNORMAL LOW (ref 3.5–5.1)
Sodium: 139 mmol/L (ref 135–145)

## 2024-03-02 LAB — MAGNESIUM: Magnesium: 2.1 mg/dL (ref 1.7–2.4)

## 2024-03-02 MED ORDER — PANTOPRAZOLE SODIUM 40 MG PO TBEC: 40.0000 mg | DELAYED_RELEASE_TABLET | Freq: Every day | ORAL | 1 refills | Status: AC

## 2024-03-02 MED ORDER — POLYETHYLENE GLYCOL 3350 17 G PO PACK: 17.0000 g | PACK | Freq: Every day | ORAL | Status: AC | PRN

## 2024-03-02 MED ORDER — OXYCODONE-ACETAMINOPHEN 5-325 MG PO TABS: 1.0000 | ORAL_TABLET | Freq: Three times a day (TID) | ORAL | 0 refills | Status: AC | PRN

## 2024-03-02 MED ORDER — LIDOCAINE 5 % EX PTCH: 1.0000 | MEDICATED_PATCH | CUTANEOUS | 0 refills | Status: AC

## 2024-03-02 MED ORDER — ASCORBIC ACID 500 MG PO TABS: 500.0000 mg | ORAL_TABLET | Freq: Every day | ORAL | Status: AC

## 2024-03-02 MED ORDER — SULFAMETHOXAZOLE-TRIMETHOPRIM 800-160 MG PO TABS: 1.0000 | ORAL_TABLET | ORAL | 1 refills | Status: DC

## 2024-03-02 MED ORDER — PREDNISONE 10 MG PO TABS: ORAL_TABLET | ORAL | 0 refills | Status: AC

## 2024-03-02 MED ORDER — IPRATROPIUM-ALBUTEROL 0.5-2.5 (3) MG/3ML IN SOLN: 3.0000 mL | Freq: Four times a day (QID) | RESPIRATORY_TRACT | 2 refills | Status: AC

## 2024-03-02 MED ORDER — AMLODIPINE BESYLATE 5 MG PO TABS: 5.0000 mg | ORAL_TABLET | Freq: Every day | ORAL | 1 refills | Status: AC

## 2024-03-02 MED ORDER — BENZONATATE 200 MG PO CAPS: 200.0000 mg | ORAL_CAPSULE | Freq: Three times a day (TID) | ORAL | 0 refills | Status: AC | PRN

## 2024-03-02 MED ORDER — POTASSIUM CHLORIDE CRYS ER 20 MEQ PO TBCR
40.0000 meq | EXTENDED_RELEASE_TABLET | Freq: Once | ORAL | Status: AC
  Administered 2024-03-02: 40 meq via ORAL
  Filled 2024-03-02: qty 2

## 2024-03-02 MED ORDER — ZINC SULFATE 220 (50 ZN) MG PO CAPS: 220.0000 mg | ORAL_CAPSULE | Freq: Every day | ORAL | Status: AC

## 2024-03-02 NOTE — Inpatient Diabetes Management (Signed)
 Inpatient Diabetes Program Recommendations  AACE/ADA: New Consensus Statement on Inpatient Glycemic Control   Target Ranges:  Prepandial:   less than 140 mg/dL      Peak postprandial:   less than 180 mg/dL (1-2 hours)      Critically ill patients:  140 - 180 mg/dL    Latest Reference Range & Units 03/01/24 03:28 03/01/24 07:43 03/01/24 10:58 03/01/24 18:12 03/01/24 19:30 03/02/24 00:27 03/02/24 04:15  Glucose-Capillary 70 - 99 mg/dL 822 (H) 695 (H) 678 (H) 178 (H) 224 (H) 149 (H) 188 (H)   Review of Glycemic Control  Diabetes history: DM1 Outpatient Diabetes medications: Insulin  Pump with Humalog Current orders for Inpatient glycemic control: Semglee  25 units daily, Novolog  0-20 units Q4H; Prednisone 60 mg QAM    Inpatient Diabetes Program Recommendations:     Insulin : Noted steroids decreased and last dose of Solumedrol 125 mg was today at 8:44 am.   If steroids are continued as ordered, please consider ordering Novolog  5 units TID with meals for meal coverage if patient eats at least 50% of meals, changing CBGs to AC&HS and decreasing Novolog  correction to 0-15 units AC&HS.   NOTE: Noted consult for Diabetes Coordinator. Diabetes Coordinator is not on campus over the weekend but available by pager from 8am to 5pm for questions or concerns.   Thanks, Earnie Gainer, RN, MSN, CDCES Diabetes Coordinator Inpatient Diabetes Program [phone] (Team Pager from 8am to 5pm)

## 2024-03-02 NOTE — Discharge Summary (Addendum)
 Physician Discharge Summary  [name] FMW:[id number] DOB: 03-22-1981 DOA: 02/22/2024  Admit date: 02/22/2024 Discharge date: 03/02/2024  Admitted From:  HOME  Disposition: HOME   Recommendations for Outpatient Follow-up:  Follow up with PCP in 1 weeks Follow up with Dr. Synthia on 03/19/24 as scheduled    Home Health:  DME nebulizer   Discharge Condition: STABLE   CODE STATUS: FULL DIET:  resume diabetic diet    Brief Hospitalization Summary: Please see all hospital notes, images, labs for full details of the hospitalization. Admission provider HPI:  43 y.o. female with medical history significant of Lupus, Type 1 diabetes, cervical HSIL and hypertension presented to hospital with productive cough and increased shortness of breath.  She had initially stopped [place] the fire station when she was noted to be hypoxic with pulse ox of 80s and room air.  He was given nebs Solu-Medrol IV fluids and was brought into the hospital.  In the ED patient had leukocytosis with elevated lactate and chest x-ray showed multifocal pneumonia.  Patient was also noted to have elevated glucose with anion gap and was admitted to hospital for further evaluation and treatment.   Hospital Course by listed problems addressed   Sepsis secondary to multifocal pneumonia- RESOLVED  Respiratory viral panel positive for rhinovirus, enterovirus. -Immunocompromised with a history of lupus.  Pt completed IV antibiotics, received IV steroids, respiratory support.  Continue  mucolytics, bronchodilators.  Leukocytosis has normalized.     Acute respiratory failure with hypoxia secondary to multifocal pneumonia--IMPROVED  Overall improving.  Initially required 4L of oxygen by nasal cannula but now weaned to room air oxygen.  Was on IV steroids and currently transitioned to oral steroid with long taper per pulmonology doctor.  Seen by [facility] consult notes.  Pt stable to discharge home today and will have home O2 eval prior  to discharge, TOC requested for home nebulizer and to follow up on  home oxygen if needed.  Long taper ordered per pulmonary instructions.  Pt has outpatient follow up with pulmonologist on 03/19/24 with Dr. Synthia.       Type I DKA with hyperglycemia  -- RESOLVED  Resolved but hyperglycemic due to steroids.  Dose of Semglee  has been increased to 25 units from 15.  Continue sliding scale insulin  Q4 hourly, Accu-Cheks.  Patient resuming home insulin  pump for ongoing DM mgmt.  Most recent hemoglobin A1c was 9.6.   GERD/GI prophylaxis  - Continue PPI.   Acute kidney injury on chronic kidney disease stage III A Creatinine has now improved.  Latest creatinine of 1.2.    History of lupus Follow up with outpatient provider recommended.  Long steroid taper and resumption of cellcept per pulmonologist.     overweight Body mass index is 29.29 kg/m.  Would benefit from lifestyle modification.   DVT prophylaxis: heparin  injection 5,000 Units Start: 02/23/24 0600 SCDs Start: 02/23/24 0506   Discharge Diagnoses:  Principal Problem:   Sepsis due to pneumonia [facility]) Active Problems:   Diabetic ketoacidosis without coma associated with type 1 diabetes mellitus (HCC)   Multifocal pneumonia   Acute respiratory failure with hypoxia [facility])   Discharge Instructions:  Allergies as of 03/02/2024       Reactions   Strawberry Extract Dermatitis   Pt states she was allergic as a child but not so far as an adult.        Medication List     PAUSE taking these medications    BENLYSTA  IV Wait to take  this until your doctor or other care provider tells you to start again. Inject into the vein.   hydroxychloroquine 200 MG tablet Wait to take this until your doctor or other care provider tells you to start again. Commonly known as: PLAQUENIL Take 200 mg by mouth 2 (two) times daily.       STOP taking these medications    amoxicillin-clavulanate 875-125 MG tablet Commonly known as: AUGMENTIN        TAKE these medications    amLODipine 5 MG tablet Commonly known as: NORVASC Take 1 tablet (5 mg total) by mouth daily. What changed:  medication strength how much to take   ascorbic acid 500 MG tablet Commonly known as: VITAMIN C Take 1 tablet (500 mg total) by mouth daily. Start taking on: March 03, 2024   aspirin EC 81 MG tablet Take 81 mg by mouth daily. Swallow whole.   benzonatate 200 MG capsule Commonly known as: TESSALON Take 1 capsule (200 mg total) by mouth 3 (three) times daily as needed for cough.   HumaLOG 100 UNIT/ML injection Generic drug: insulin  lispro Inject 100 Units into the skin daily. Via insulin  pump   ipratropium-albuterol 0.5-2.5 (3) MG/3ML Soln Commonly known as: DUONEB Take 3 mLs by nebulization every 6 (six) hours.   lidocaine  5 % Commonly known as: LIDODERM  Place 1 patch onto the skin daily. Remove & Discard patch within 12 hours or as directed by MD   lisinopril  20 MG tablet Commonly known as: ZESTRIL  Take 20 mg by mouth daily.   LORazepam  0.5 MG tablet Commonly known as: ATIVAN  Take 0.5 mg by mouth 2 (two) times daily.   mycophenolate 500 MG tablet Commonly known as: CELLCEPT Take 1,500 mg by mouth 2 (two) times daily.   nicotine  14 mg/24hr patch Commonly known as: NICODERM CQ  - dosed in mg/24 hours Place 1 patch (14 mg total) onto the skin daily.   ondansetron  8 MG disintegrating tablet Commonly known as: ZOFRAN -ODT Take 1 tablet (8 mg total) by mouth every 8 (eight) hours as needed for nausea or vomiting.   oxyCODONE-acetaminophen  5-325 MG tablet Commonly known as: PERCOCET/ROXICET Take 1 tablet by mouth every 8 (eight) hours as needed for severe pain (pain score 7-10). What changed: when to take this   pantoprazole  40 MG tablet Commonly known as: Protonix  Take 1 tablet (40 mg total) by mouth daily.   polyethylene glycol 17 g packet Commonly known as: MIRALAX / GLYCOLAX Take 17 g by mouth daily as needed.    predniSONE 10 MG tablet Commonly known as: DELTASONE Take 6 tablets (60 mg total) by mouth daily with breakfast for 13 days, THEN 5 tablets (50 mg total) daily with breakfast for 14 days, THEN 3 tablets (30 mg total) daily with breakfast for 14 days, THEN 2 tablets (20 mg total) daily with breakfast for 14 days, THEN 1 tablet (10 mg total) daily with breakfast for 14 days. Start taking on: March 03, 2024   pregabalin 150 MG capsule Commonly known as: LYRICA Take 150 mg by mouth 3 (three) times daily.   rosuvastatin 10 MG tablet Commonly known as: CRESTOR Take 10 mg by mouth daily.   sertraline  100 MG tablet Commonly known as: ZOLOFT  Take 200 mg by mouth daily.   sulfamethoxazole-trimethoprim 800-160 MG tablet Commonly known as: Bactrim DS Take 1 tablet by mouth 3 (three) times a week. Start taking on: March 03, 2024   Varenicline  Tartrate (Starter) 0.5 MG X 11 & 1 MG X  42 Tbpk Commonly known as: Chantix  Starting Month Pak Take 1 tablet by mouth daily.   zinc sulfate (50mg  elemental zinc) 220 (50 Zn) MG capsule Take 1 capsule (220 mg total) by mouth daily. Start taking on: March 03, 2024               Durable Medical Equipment  (From admission, onward)           Start     Ordered   03/02/24 0949  For home use only DME Nebulizer machine  Once       Question Answer Comment  Patient needs a nebulizer to treat with the following condition COPD (chronic obstructive pulmonary disease) (HCC)   Patient needs a nebulizer to treat with the following condition COPD with acute exacerbation (HCC)   Length of Need Lifetime   Additional equipment included Administration kit   Additional equipment included Filter      03/02/24 0949            Follow-up Information     Primary Care Provider. Schedule an appointment as soon as possible for a visit in 1 week(s).   Why: Hospital Follow Up        Mannam, Praveen, MD Follow up on 03/19/2024.   Specialty:  Pulmonary Disease Why: Hospital Follow Up Contact information: [address] [place] [address] [phone]                Allergies  Allergen Reactions   Strawberry Extract Dermatitis    Pt states she was allergic as a child but not so far as an adult.   Allergies as of 03/02/2024       Reactions   Strawberry Extract Dermatitis   Pt states she was allergic as a child but not so far as an adult.        Medication List     PAUSE taking these medications    BENLYSTA  IV Wait to take this until your doctor or other care provider tells you to start again. Inject into the vein.   hydroxychloroquine 200 MG tablet Wait to take this until your doctor or other care provider tells you to start again. Commonly known as: PLAQUENIL Take 200 mg by mouth 2 (two) times daily.       STOP taking these medications    amoxicillin-clavulanate 875-125 MG tablet Commonly known as: AUGMENTIN       TAKE these medications    amLODipine 5 MG tablet Commonly known as: NORVASC Take 1 tablet (5 mg total) by mouth daily. What changed:  medication strength how much to take   ascorbic acid 500 MG tablet Commonly known as: VITAMIN C Take 1 tablet (500 mg total) by mouth daily. Start taking on: March 03, 2024   aspirin EC 81 MG tablet Take 81 mg by mouth daily. Swallow whole.   benzonatate 200 MG capsule Commonly known as: TESSALON Take 1 capsule (200 mg total) by mouth 3 (three) times daily as needed for cough.   HumaLOG 100 UNIT/ML injection Generic drug: insulin  lispro Inject 100 Units into the skin daily. Via insulin  pump   ipratropium-albuterol 0.5-2.5 (3) MG/3ML Soln Commonly known as: DUONEB Take 3 mLs by nebulization every 6 (six) hours.   lidocaine  5 % Commonly known as: LIDODERM  Place 1 patch onto the skin daily. Remove & Discard patch within 12 hours or as directed by MD   lisinopril  20 MG tablet Commonly known as: ZESTRIL  Take 20 mg by  mouth daily.   LORazepam  0.5 MG tablet Commonly known as: ATIVAN  Take 0.5 mg by mouth 2 (two) times daily.   mycophenolate 500 MG tablet Commonly known as: CELLCEPT Take 1,500 mg by mouth 2 (two) times daily.   nicotine  14 mg/24hr patch Commonly known as: NICODERM CQ  - dosed in mg/24 hours Place 1 patch (14 mg total) onto the skin daily.   ondansetron  8 MG disintegrating tablet Commonly known as: ZOFRAN -ODT Take 1 tablet (8 mg total) by mouth every 8 (eight) hours as needed for nausea or vomiting.   oxyCODONE-acetaminophen  5-325 MG tablet Commonly known as: PERCOCET/ROXICET Take 1 tablet by mouth every 8 (eight) hours as needed for severe pain (pain score 7-10). What changed: when to take this   pantoprazole  40 MG tablet Commonly known as: Protonix  Take 1 tablet (40 mg total) by mouth daily.   polyethylene glycol 17 g packet Commonly known as: MIRALAX / GLYCOLAX Take 17 g by mouth daily as needed.   predniSONE 10 MG tablet Commonly known as: DELTASONE Take 6 tablets (60 mg total) by mouth daily with breakfast for 13 days, THEN 5 tablets (50 mg total) daily with breakfast for 14 days, THEN 3 tablets (30 mg total) daily with breakfast for 14 days, THEN 2 tablets (20 mg total) daily with breakfast for 14 days, THEN 1 tablet (10 mg total) daily with breakfast for 14 days. Start taking on: March 03, 2024   pregabalin 150 MG capsule Commonly known as: LYRICA Take 150 mg by mouth 3 (three) times daily.   rosuvastatin 10 MG tablet Commonly known as: CRESTOR Take 10 mg by mouth daily.   sertraline  100 MG tablet Commonly known as: ZOLOFT  Take 200 mg by mouth daily.   sulfamethoxazole-trimethoprim 800-160 MG tablet Commonly known as: Bactrim DS Take 1 tablet by mouth 3 (three) times a week. Start taking on: March 03, 2024   Varenicline  Tartrate (Starter) 0.5 MG X 11 & 1 MG X 42 Tbpk Commonly known as: Chantix  Starting Month Pak Take 1 tablet by mouth daily.   zinc  sulfate (50mg  elemental zinc) 220 (50 Zn) MG capsule Take 1 capsule (220 mg total) by mouth daily. Start taking on: March 03, 2024               Durable Medical Equipment  (From admission, onward)           Start     Ordered   03/02/24 0949  For home use only DME Nebulizer machine  Once       Question Answer Comment  Patient needs a nebulizer to treat with the following condition COPD (chronic obstructive pulmonary disease) (HCC)   Patient needs a nebulizer to treat with the following condition COPD with acute exacerbation (HCC)   Length of Need Lifetime   Additional equipment included Administration kit   Additional equipment included Filter      03/02/24 0949            Procedures/Studies: DG CHEST PORT 1 VIEW Result Date: 02/29/2024 EXAM: 1 VIEW(S) XRAY OF THE CHEST 02/29/2024 04:07:00 AM COMPARISON: 02/25/2024 CLINICAL HISTORY: ILD (interstitial lung disease) (HCC) 831397. 3rd SHIFT MORNING ROUTINE FOLLOW-UP IMAGE timed for 0600 FOR: ILD (interstitial lung disease) FINDINGS: LINES, TUBES AND DEVICES: Left upper extremity PICC in place with tip at superior cavoatrial junction. LUNGS AND PLEURA: Improved lung aeration with decreased diffuse interstitial/airspace opacities. Persistent left lower lung nodular opacity. Bilateral blunting of costophrenic angles, suggesting small pleural effusions. No pulmonary edema.  No pneumothorax. HEART AND MEDIASTINUM: No acute abnormality of the cardiac and mediastinal silhouettes. BONES AND SOFT TISSUES: Old right rib fractures. No acute osseous abnormality. IMPRESSION: 1. Improved lung aeration with decreased diffuse interstitial and airspace opacities. 2. Persistent left lower lung nodular opacity, for which follow-up chest radiograph is advised in 2-3 months. 3. Similar small bilateral pleural effusions. Electronically signed by: Selinda Blue MD 02/29/2024 08:48 AM EDT RP Workstation: [id number]   ECHOCARDIOGRAM COMPLETE Result Date:  02/26/2024    ECHOCARDIOGRAM REPORT   Patient Name:   [name] Date of Exam: 02/26/2024 Medical Rec #:  [id number]    Height:       63.0 in Accession #:    [id number]   Weight:       165.3 lb Date of Birth:  [birth year]    BSA:          1.783 m Patient Age:    43 years     BP:           146/67 mmHg Patient Gender: F            HR:           67 bpm. Exam Location:  [facility] Procedure: 2D Echo, Cardiac Doppler and Color Doppler (Both Spectral and Color            Flow Doppler were utilized during procedure). Indications:    Dyspnea R06.00  History:        Patient has prior history of Echocardiogram examinations, most                 recent 05/13/2017. Signs/Symptoms:Shortness of Breath and Dyspnea;                 Risk Factors:Hypertension, Diabetes, Current Smoker and Lupus.  Sonographer:    Koleen Popper RDCS Referring Phys: [phone] SEYED A SHAHMEHDI  Sonographer Comments: Image acquisition challenging due to respiratory motion. IMPRESSIONS  1. Left ventricular ejection fraction, by estimation, is 60 to 65%. The left ventricle has normal function. The left ventricle has no regional wall motion abnormalities. Left ventricular diastolic parameters are consistent with Grade II diastolic dysfunction (pseudonormalization). Elevated left atrial pressure.  2. Right ventricular systolic function is normal. The right ventricular size is normal. Tricuspid regurgitation signal is inadequate for assessing PA pressure.  3. The mitral valve is normal in structure. Trivial mitral valve regurgitation. No evidence of mitral stenosis.  4. The aortic valve was not well visualized. Aortic valve regurgitation is not visualized. No aortic stenosis is present.  5. The inferior vena cava is dilated in size with <50% respiratory variability, suggesting right atrial pressure of 15 mmHg. Comparison(s): No prior Echocardiogram. FINDINGS  Left Ventricle: Left ventricular ejection fraction, by estimation, is 60 to 65%. The left ventricle has  normal function. The left ventricle has no regional wall motion abnormalities. Strain was performed and the global longitudinal strain is indeterminate. The left ventricular internal cavity size was normal in size. There is no left ventricular hypertrophy. Left ventricular diastolic parameters are consistent with Grade II diastolic dysfunction (pseudonormalization). Elevated left atrial pressure. Right Ventricle: The right ventricular size is normal. No increase in right ventricular wall thickness. Right ventricular systolic function is normal. Tricuspid regurgitation signal is inadequate for assessing PA pressure. Left Atrium: Left atrial size was normal in size. Right Atrium: Right atrial size was normal in size. Pericardium: There is no evidence of pericardial effusion. Mitral Valve: The mitral valve is normal in structure. Trivial  mitral valve regurgitation. No evidence of mitral valve stenosis. Tricuspid Valve: The tricuspid valve is normal in structure. Tricuspid valve regurgitation is not demonstrated. No evidence of tricuspid stenosis. Aortic Valve: The aortic valve was not well visualized. Aortic valve regurgitation is not visualized. No aortic stenosis is present. Pulmonic Valve: The pulmonic valve was not well visualized. Pulmonic valve regurgitation is not visualized. No evidence of pulmonic stenosis. Aorta: The aortic root is normal in size and structure. Venous: The inferior vena cava is dilated in size with less than 50% respiratory variability, suggesting right atrial pressure of 15 mmHg. IAS/Shunts: No atrial level shunt detected by color flow Doppler. Additional Comments: 3D was performed not requiring image post processing on an independent workstation and was indeterminate.  LEFT VENTRICLE PLAX 2D LVIDd:         4.00 cm   Diastology LVIDs:         2.70 cm   LV e' medial:    8.16 cm/s LV PW:         0.70 cm   LV E/e' medial:  15.6 LV IVS:        0.90 cm   LV e' lateral:   11.70 cm/s LVOT diam:      1.70 cm   LV E/e' lateral: 10.9 LV SV:         66 LV SV Index:   37 LVOT Area:     2.27 cm  RIGHT VENTRICLE             IVC RV Basal diam:  3.60 cm     IVC diam: 2.20 cm RV S prime:     13.80 cm/s TAPSE (M-mode): 2.8 cm LEFT ATRIUM             Index        RIGHT ATRIUM           Index LA diam:        3.10 cm 1.74 cm/m   RA Area:     13.00 cm LA Vol (A2C):   30.7 ml 17.21 ml/m  RA Volume:   31.50 ml  17.66 ml/m LA Vol (A4C):   33.8 ml 18.95 ml/m LA Biplane Vol: 34.4 ml 19.29 ml/m  AORTIC VALVE LVOT Vmax:   130.00 cm/s LVOT Vmean:  83.900 cm/s LVOT VTI:    0.289 m  AORTA Ao Root diam: 2.70 cm Ao Asc diam:  2.70 cm MITRAL VALVE MV Area (PHT): 4.31 cm     SHUNTS MV Decel Time: 176 msec     Systemic VTI:  0.29 m MR Peak grad: 50.6 mmHg     Systemic Diam: 1.70 cm MR Vmax:      355.50 cm/s MV E velocity: 127.00 cm/s MV A velocity: 64.90 cm/s MV E/A ratio:  1.96 Vishnu Priya Mallipeddi Electronically signed by Diannah Late Mallipeddi Signature Date/Time: 02/26/2024/3:40:39 PM    Final    DG CHEST PORT 1 VIEW Result Date: 02/25/2024 CLINICAL DATA:  Shortness of breath. EXAM: PORTABLE CHEST 1 VIEW COMPARISON:  Radiograph yesterday, CT 10/18/20255 FINDINGS: Normal heart size and mediastinal contours. Slight improvement in heterogeneous bilateral lung opacities. More confluent opacities at the lung bases are again seen. Suggest developing small pleural effusions. No pneumothorax. IMPRESSION: 1. Slight improvement in heterogeneous bilateral lung opacities. More confluent opacities at the lung bases are again seen. 2. Suggest developing small pleural effusions. Electronically Signed   By: Andrea Gasman M.D.   On: 02/25/2024 15:43   US  EKG SITE  RITE Result Date: 02/25/2024 If Site Rite image not attached, placement could not be confirmed due to current cardiac rhythm.  DG CHEST PORT 1 VIEW Result Date: 02/24/2024 CLINICAL DATA:  Multifocal pneumonia. EXAM: PORTABLE CHEST 1 VIEW COMPARISON:  Chest CTA  02/23/2024.  Chest x-ray 02/22/2024. FINDINGS: Cardiopericardial silhouette is at upper limits of normal for size. Diffuse interstitial and patchy/nodular bilateral diffuse airspace opacity is progressive since 02/22/2024 chest x-ray. More confluent consolidative opacities seen in the lateral right base and retrocardiac medial left base. No substantial pleural effusion. No acute bony abnormality. Telemetry leads overlie the chest. IMPRESSION: Progressive diffuse interstitial and patchy/nodular bilateral airspace opacities with more confluent consolidative opacities in the lateral right base and retrocardiac medial left base. Imaging features compatible with multifocal pneumonia. Electronically Signed   By: Camellia Candle M.D.   On: 02/24/2024 07:49   CT Angio Chest PE W/Cm &/Or Wo Cm Result Date: 02/23/2024 EXAM: CTA CHEST 02/23/2024 07:34:01 AM TECHNIQUE: CTA of the chest was performed after the administration of intravenous contrast. Multiplanar reformatted images are provided for review. MIP images are provided for review. Automated exposure control, iterative reconstruction, and/or weight based adjustment of the mA/kV was utilized to reduce the radiation dose to as low as reasonably achievable. COMPARISON: Chest CT without contrast 06/07/2023, and portable chest from yesterday. CLINICAL HISTORY: Pulmonary embolism (PE) suspected, high prob; lupus, SOB, hypoxic, tachycardia. Sob. c/o cough and congestion for a few days. States got sob around bedtime tonight and tried to make it to hospital pov but had to stop at local fire department en route and call 911 from there. EMS reports O2 sats on R/A at fire department was 80% ; and pt had both inspiratory and expiratory wheezes throughout. FINDINGS: PULMONARY ARTERIES: Pulmonary arteries are adequately opacified for evaluation. The pulmonary trunk is upper normal in caliber. No arterial embolism is seen at least to the segmental level, but the subsegmental  arterial bed is largely obscured due to abundant breathing motion. MEDIASTINUM: The heart is upper normal in size. No pericardial effusion. Scattered 3-vessel coronary artery calcifications are again noted, unusual at this age. Consider coronary artery calcium  scoring CT for risk stratification. The aorta and great vessels are within normal limits. The pulmonary veins are nondistended. There is increased thickening in the distal thoracic esophagus, which is typically reflux-related. Endoscopy may be indicated. The trachea, main bronchi, thyroid  and axillary spaces are unremarkable. LYMPH NODES: Interval mild enlargement of lymph nodes in the subcarinal space, right paratracheal area, and both hila, largest measuring 1.2 cm in short axis. These are probably reactive lymph nodes. There is no bulky or encasing adenopathy. LUNGS AND PLEURA: Diffuse bronchial thickening. There is extensive dense ground glass airspace disease throughout the lungs without peripheral sparing. There is underlying interstitial thickening. These opacities are most confluent in the upper lobes. There is no pleural effusion, thickening, or pneumothorax. UPPER ABDOMEN: Limited images of the upper abdomen are unremarkable accounting for motion blur. SOFT TISSUES AND BONES: No acute bone or soft tissue abnormality. IMPRESSION: 1. No pulmonary embolism identified to the segmental level; subsegmental evaluation limited by motion artifact. 2. Extensive bilateral ground-glass airspace disease, greatest in the upper lobes, with underlying interstitial thickening and diffuse bronchial wall thickening. This is most likely a multifocal pneumonia; other possibilities include drug reaction and alveolar proteinosis. . Pulmonary edema and ards are possible but there are no pleural effusions. 3. Interval mild enlargement of mediastinal and hilar lymph nodes up to 1.2 cm, likely reactive, without  bulky or encasing adenopathy. 4. Distal thoracic esophageal wall  thickening, commonly reflux-related; consider endoscopic evaluation. Electronically signed by: Francis Quam MD 02/23/2024 07:57 AM EDT RP Workstation: [id number]   DG Chest Portable 1 View Result Date: 02/23/2024 EXAM: 1 VIEW(S) XRAY OF THE CHEST 02/22/2024 11:52:37 PM COMPARISON: 1/6 / 19. CLINICAL HISTORY: Shortness of breath, cough, and congestion for a few days. Acute onset shortness of breath tonight with O2 saturation of 80% on room air and inspiratory and expiratory wheezes. Patient has an insulin  pump and a blood glucose of 331. FINDINGS: LUNGS AND PLEURA: Bilateral patchy hazy airspace opacities. Diffuse interstitial prominence. No pulmonary edema. No pleural effusion. No pneumothorax. HEART AND MEDIASTINUM: No acute abnormality of the cardiac and mediastinal silhouettes. BONES AND SOFT TISSUES: No acute osseous abnormality. Remote right rib fractures. IMPRESSION: 1. Multifocal pneumonia. Electronically signed by: Norman Gatlin MD 02/23/2024 12:08 AM EDT RP Workstation: [id number]   CT Renal Stone Study Result Date: 02/08/2024 CLINICAL DATA:  Flank pain. EXAM: CT ABDOMEN AND PELVIS WITHOUT CONTRAST TECHNIQUE: Multidetector CT imaging of the abdomen and pelvis was performed following the standard protocol without IV contrast. RADIATION DOSE REDUCTION: This exam was performed according to the departmental dose-optimization program which includes automated exposure control, adjustment of the mA and/or kV according to patient size and/or use of iterative reconstruction technique. COMPARISON:  May 12, 2017.  June 07, 2023. FINDINGS: Lower chest: No acute abnormality. Hepatobiliary: No focal liver abnormality is seen. No gallstones, gallbladder wall thickening, or biliary dilatation. Pancreas: Unremarkable. No pancreatic ductal dilatation or surrounding inflammatory changes. Spleen: Stable probable calcifications seen in superior portion of spleen. No acute abnormality is noted. Adrenals/Urinary  Tract: Adrenal glands are unremarkable. Kidneys are normal, without renal calculi, focal lesion, or hydronephrosis. Bladder is unremarkable. Stomach/Bowel: Stomach and appendix are unremarkable. There is no evidence of bowel obstruction. Mild to moderate wall thickening of descending and sigmoid colon is noted which may be due to lack of distension, but infectious or inflammatory colitis cannot be excluded. Vascular/Lymphatic: Aortic atherosclerosis. No enlarged abdominal or pelvic lymph nodes. Reproductive: Uterus and bilateral adnexa are unremarkable. Other: No abdominal wall hernia or abnormality. No abdominopelvic ascites. Musculoskeletal: No acute or significant osseous findings. IMPRESSION: 1. Mild to moderate wall thickening of descending and sigmoid colon is noted which may be due to lack of distension, but infectious or inflammatory colitis cannot be excluded. 2. Aortic atherosclerosis. Aortic Atherosclerosis (ICD10-I70.0). Electronically Signed   By: Lynwood Landy Raddle M.D.   On: 02/08/2024 14:09     Subjective: Pt reports she is breathing a lot better and feels she can manage well at home now.    Discharge Exam: Vitals:   03/02/24 0448 03/02/24 0844  BP: 132/62   Pulse: 86   Resp:    Temp: 98.6 F (37 C)   SpO2: 92% 100%   Vitals:   03/01/24 1935 03/01/24 1954 03/02/24 0448 03/02/24 0844  BP: (!) 134/57  132/62   Pulse: 79  86   Resp: 20     Temp: 98.4 F (36.9 C)  98.6 F (37 C)   TempSrc: Oral  Oral   SpO2: 98% 100% 92% 100%  Weight:      Height:       General: Pt is alert, awake, not in acute distress Cardiovascular: normal S1/S2 +, no rubs, no gallops Respiratory: good air movement bilateral and no increased work of breathing  Abdominal: Soft, NT, ND, bowel sounds + Extremities: no edema, no cyanosis  The results of significant diagnostics from this hospitalization (including imaging, microbiology, ancillary and laboratory) are listed below for reference.      Microbiology: Recent Results (from the past 240 hours)  Culture, blood (routine x 2)     Status: None   Collection Time: 02/23/24 12:53 AM   Specimen: Left Antecubital; Blood  Result Value Ref Range Status   Specimen Description LEFT ANTECUBITAL  Final   Special Requests   Final    BOTTLES DRAWN AEROBIC AND ANAEROBIC Blood Culture adequate volume   Culture   Final    NO GROWTH 5 DAYS Performed at [facility], [address], [place], [place] [address]    Report Status 02/28/2024 FINAL  Final  Culture, blood (routine x 2)     Status: None   Collection Time: 02/23/24 12:57 AM   Specimen: Left Antecubital; Blood  Result Value Ref Range Status   Specimen Description LEFT ANTECUBITAL  Final   Special Requests   Final    BOTTLES DRAWN AEROBIC AND ANAEROBIC Blood Culture adequate volume   Culture   Final    NO GROWTH 5 DAYS Performed at [facility], [address] [place], [place] [address]    Report Status 02/28/2024 FINAL  Final  MRSA Next Gen by PCR, Nasal     Status: None   Collection Time: 02/23/24  3:04 AM   Specimen: Nasal Mucosa; Nasal Swab  Result Value Ref Range Status   MRSA by PCR Next Gen NOT DETECTED NOT DETECTED Final    Comment: (NOTE) The GeneXpert MRSA Assay (FDA approved for NASAL specimens only), is one component of a comprehensive MRSA colonization surveillance program. It is not intended to diagnose MRSA infection nor to guide or monitor treatment for MRSA infections. Test performance is not FDA approved in patients less than 14 years old. Performed at [facility], [address] [place], [place] [address]   Respiratory (~20 pathogens) panel by PCR     Status: Abnormal   Collection Time: 02/23/24  6:54 AM   Specimen: Nasopharyngeal Swab; Respiratory  Result Value Ref Range Status   Adenovirus NOT DETECTED NOT DETECTED Final   Coronavirus 229E NOT DETECTED NOT DETECTED Final    Comment: (NOTE) The Coronavirus on the Respiratory Panel, DOES NOT test  for the novel  Coronavirus (2019 nCoV)    Coronavirus HKU1 NOT DETECTED NOT DETECTED Final   Coronavirus NL63 NOT DETECTED NOT DETECTED Final   Coronavirus OC43 NOT DETECTED NOT DETECTED Final   Metapneumovirus NOT DETECTED NOT DETECTED Final   Rhinovirus / Enterovirus DETECTED (A) NOT DETECTED Final   Influenza A NOT DETECTED NOT DETECTED Final   Influenza B NOT DETECTED NOT DETECTED Final   Parainfluenza Virus 1 NOT DETECTED NOT DETECTED Final   Parainfluenza Virus 2 NOT DETECTED NOT DETECTED Final   Parainfluenza Virus 3 NOT DETECTED NOT DETECTED Final   Parainfluenza Virus 4 NOT DETECTED NOT DETECTED Final   Respiratory Syncytial Virus NOT DETECTED NOT DETECTED Final   Bordetella pertussis NOT DETECTED NOT DETECTED Final   Bordetella Parapertussis NOT DETECTED NOT DETECTED Final   Chlamydophila pneumoniae NOT DETECTED NOT DETECTED Final   Mycoplasma pneumoniae NOT DETECTED NOT DETECTED Final    Comment: Performed at [facility], [address], [place], [place] [address]     Labs: BNP (last 3 results) No results for input(s): BNP in the last 8760 hours. Basic Metabolic Panel: Recent Labs  Lab 02/25/24 0728 02/26/24 9164 02/27/24 0442 02/28/24 9681  03/02/24 0433  NA 141 142 142 138 139  K 4.1 3.6 3.7 3.6 3.1*  CL 109 104 103 98 100  CO2 19* 22 27 28 28   GLUCOSE 173* 171* 104* 230* 182*  BUN 36* 45* 45* 37* 37*  CREATININE 1.33* 1.48* 1.42* 1.25* 1.21*  CALCIUM  8.6* 8.8* 8.8* 8.7* 8.4*  MG  --   --   --   --  2.1   Liver Function Tests: Recent Labs  Lab 02/26/24 0835 02/27/24 0442 02/28/24 0318  AST 19 16 14*  ALT 10 10 9   ALKPHOS 88 88 95  BILITOT 0.2 0.2 0.2  PROT 6.1* 6.1* 6.0*  ALBUMIN 3.3* 3.4* 3.3*   No results for input(s): LIPASE, AMYLASE in the last 168 hours. No results for input(s): AMMONIA in the last 168 hours. CBC: Recent Labs  Lab 02/25/24 0728 02/26/24 0835 02/27/24 0442 02/28/24 0318 03/02/24 0433  WBC 22.1* 17.1*  13.1* 8.3 14.6*  NEUTROABS  --  15.8* 11.9* 7.5  --   HGB 9.7* 9.4* 9.7* 10.2* 10.3*  HCT 30.5* 29.7* 30.6* 31.7* 32.9*  MCV 87.4 87.4 85.7 84.8 86.1  PLT 290 282 291 304 376   Cardiac Enzymes: No results for input(s): CKTOTAL, CKMB, CKMBINDEX, TROPONINI in the last 168 hours. BNP: Invalid input(s): POCBNP CBG: Recent Labs  Lab 03/01/24 1812 03/01/24 1930 03/02/24 0027 03/02/24 0415 03/02/24 0727  GLUCAP 178* 224* 149* 188* 127*   D-Dimer No results for input(s): DDIMER in the last 72 hours. Hgb A1c No results for input(s): HGBA1C in the last 72 hours. Lipid Profile No results for input(s): CHOL, HDL, LDLCALC, TRIG, CHOLHDL, LDLDIRECT in the last 72 hours. Thyroid  function studies No results for input(s): TSH, T4TOTAL, T3FREE, THYROIDAB in the last 72 hours.  Invalid input(s): FREET3 Anemia work up No results for input(s): VITAMINB12, FOLATE, FERRITIN, TIBC, IRON, RETICCTPCT in the last 72 hours. Urinalysis    Component Value Date/Time   COLORURINE YELLOW 02/23/2024 0318   APPEARANCEUR CLEAR 02/23/2024 0318   LABSPEC 1.012 02/23/2024 0318   PHURINE 5.0 02/23/2024 0318   GLUCOSEU >=500 (A) 02/23/2024 0318   HGBUR NEGATIVE 02/23/2024 0318   BILIRUBINUR NEGATIVE 02/23/2024 0318   KETONESUR NEGATIVE 02/23/2024 0318   PROTEINUR NEGATIVE 02/23/2024 0318   UROBILINOGEN 1.0 07/30/2014 1241   NITRITE NEGATIVE 02/23/2024 0318   LEUKOCYTESUR NEGATIVE 02/23/2024 0318   Sepsis Labs Recent Labs  Lab 02/26/24 0835 02/27/24 0442 02/28/24 0318 03/02/24 0433  WBC 17.1* 13.1* 8.3 14.6*   Microbiology Recent Results (from the past 240 hours)  Culture, blood (routine x 2)     Status: None   Collection Time: 02/23/24 12:53 AM   Specimen: Left Antecubital; Blood  Result Value Ref Range Status   Specimen Description LEFT ANTECUBITAL  Final   Special Requests   Final    BOTTLES DRAWN AEROBIC AND ANAEROBIC Blood Culture adequate  volume   Culture   Final    NO GROWTH 5 DAYS Performed at [facility], [address] [place], [place] [address]    Report Status 02/28/2024 FINAL  Final  Culture, blood (routine x 2)     Status: None   Collection Time: 02/23/24 12:57 AM   Specimen: Left Antecubital; Blood  Result Value Ref Range Status   Specimen Description LEFT ANTECUBITAL  Final   Special Requests   Final    BOTTLES DRAWN AEROBIC AND ANAEROBIC Blood Culture adequate volume   Culture   Final    NO GROWTH 5  DAYS Performed at [facility], [address] [place], [place] [address]    Report Status 02/28/2024 FINAL  Final  MRSA Next Gen by PCR, Nasal     Status: None   Collection Time: 02/23/24  3:04 AM   Specimen: Nasal Mucosa; Nasal Swab  Result Value Ref Range Status   MRSA by PCR Next Gen NOT DETECTED NOT DETECTED Final    Comment: (NOTE) The GeneXpert MRSA Assay (FDA approved for NASAL specimens only), is one component of a comprehensive MRSA colonization surveillance program. It is not intended to diagnose MRSA infection nor to guide or monitor treatment for MRSA infections. Test performance is not FDA approved in patients less than 62 years old. Performed at [facility], [address], [place], [place] [address]   Respiratory (~20 pathogens) panel by PCR     Status: Abnormal   Collection Time: 02/23/24  6:54 AM   Specimen: Nasopharyngeal Swab; Respiratory  Result Value Ref Range Status   Adenovirus NOT DETECTED NOT DETECTED Final   Coronavirus 229E NOT DETECTED NOT DETECTED Final    Comment: (NOTE) The Coronavirus on the Respiratory Panel, DOES NOT test for the novel  Coronavirus (2019 nCoV)    Coronavirus HKU1 NOT DETECTED NOT DETECTED Final   Coronavirus NL63 NOT DETECTED NOT DETECTED Final   Coronavirus OC43 NOT DETECTED NOT DETECTED Final   Metapneumovirus NOT DETECTED NOT DETECTED Final   Rhinovirus / Enterovirus DETECTED (A) NOT DETECTED Final   Influenza A NOT DETECTED NOT DETECTED Final    Influenza B NOT DETECTED NOT DETECTED Final   Parainfluenza Virus 1 NOT DETECTED NOT DETECTED Final   Parainfluenza Virus 2 NOT DETECTED NOT DETECTED Final   Parainfluenza Virus 3 NOT DETECTED NOT DETECTED Final   Parainfluenza Virus 4 NOT DETECTED NOT DETECTED Final   Respiratory Syncytial Virus NOT DETECTED NOT DETECTED Final   Bordetella pertussis NOT DETECTED NOT DETECTED Final   Bordetella Parapertussis NOT DETECTED NOT DETECTED Final   Chlamydophila pneumoniae NOT DETECTED NOT DETECTED Final   Mycoplasma pneumoniae NOT DETECTED NOT DETECTED Final    Comment: Performed at [facility], [address] [place], [place] [address]   Time coordinating discharge:  40 mins   SIGNED:  Afton Louder, MD  [facility] 03/02/2024, 10:58 AM How to contact the [facility] Attending or Consulting provider 7A - 7P or covering provider during after hours 7P -7A, for this patient?  Check the care team in [facility] and look for a) attending/consulting [facility] provider listed and b) the [facility] team listed Log into [email or web address] and use [place]'s universal password to access. If you do not have the password, please contact the hospital operator. Locate the [facility] provider you are looking for under [facility] and page to a number that you can be directly reached. If you still have difficulty reaching the provider, please page the [facility] (Director on Call) for the Hospitalists listed on amion for assistance.

## 2024-03-02 NOTE — Telephone Encounter (Signed)
 Schedule patient with Dr. Praveen in 3 weeks.

## 2024-03-02 NOTE — Progress Notes (Signed)
 NAME:  [name], MRN:  [id number], DOB:  March 11, 1981, LOS: 8 ADMISSION DATE:  02/22/2024 CHIEF COMPLAINT:  SOB.    History of Present Illness:  This is a telemetry consult.  Consulting physician that is me is that [facility].  The patient is currently at [facility].  She is 43 year old female with history of lupus on Plaquenil, prednisone 10 mg, Mycophenolate, Benlysta ,  type 1 diabetes and hypertension. Presented with shortness of breath productive cough with yellow phlegm which started last week.  She works in a nursing home.  She received nebs Solu-Medrol IVF and supplemental oxygen.  On presentation to the hospital at [facility] she was found to be in mild DKA.  Her DKA resolved and currently she is on subcu insulin .  She did receive fluids for her DKA. She was started on Solu-Medrol, Zosyn and received couple of doses of vancomycin as well nebs treatment. Viral panel came back positive for rhino/enterovirus. CT chest was performed which extensive ground glass opacities bilaterally.  She was initially on 100%, then transition to 40% HFNC.  She smokes 5 cigarettes a day.  She has family history of asthma but no personal history of asthma. She denied episodes of pneumonia in the past. Denied history of prior or current hemoptysis.  Interim History / Subjective:   Unable to perform physicaL exam. No camera on the room  Significant Hospital Events: 02/25/2024: PCCM consulted.  On high flow nasal cannula 85%. 02/27/2024: On HFNC 45% 02/28/24: 5L [place] 95% 03/02/24 : 2L  Social history: Pets: Has a dog at home Occupation:Works as a Arts Administrator in a rehabilitation setting Exposures: Normal, heart, Jacuzzi.  No fair pillows or comforters No h/o chemo/XRT/amiodarone/macrodantin/MTX  No exposure to asbestos, silica or other organic allergens  Smoking history:Continues to smoke, with a 20-year history of tobacco use Travel history: No significant travel  history Family history: No family history of lung disease   Objective    Blood pressure 132/62, pulse 86, temperature 98.6 F (37 C), temperature source Oral, resp. rate 20, height 5' 3 (1.6 m), weight 75 kg, last menstrual period 10/26/2022, SpO2 100%.       No intake or output data in the 24 hours ending 03/02/24 1006   Filed Weights   02/22/24 2314  Weight: 75 kg    Examination: Physical examination could not be performed as this is a telemetry consult.  CTA Chest 02/23/24 1. No pulmonary embolism identified to the segmental level; subsegmental evaluation limited by motion artifact. 2. Extensive bilateral ground-glass airspace disease, greatest in the upper lobes, with underlying interstitial thickening and diffuse bronchial wall thickening. This is most likely a multifocal pneumonia; other possibilities include drug reaction and alveolar proteinosis. . Pulmonary edema and ards are possible but there are no pleural effusions. 3. Interval mild enlargement of mediastinal and hilar lymph nodes up to 1.2 cm, likely reactive, without bulky or encasing adenopathy. 4. Distal thoracic esophageal wall thickening, commonly reflux-related; consider endoscopic evaluation  Echo 02/26/24 1. Left ventricular ejection fraction, by estimation, is 60 to 65%. The  left ventricle has normal function. The left ventricle has no regional  wall motion abnormalities. Left ventricular diastolic parameters are  consistent with Grade II diastolic dysfunction (pseudonormalization). Elevated left atrial pressure.   2. Right ventricular systolic function is normal. The right ventricular  size is normal. Tricuspid regurgitation signal is inadequate for assessing  PA pressure.   3. The mitral valve is normal in structure. Trivial  mitral valve  regurgitation. No evidence of mitral stenosis.   4. The aortic valve was not well visualized. Aortic valve regurgitation  is not visualized. No aortic stenosis is  present.   5. The inferior vena cava is dilated in size with <50% respiratory  variability, suggesting right atrial pressure of 15 mmHg.   Assessment and Plan   Acute hypoxic respiratory failure Extensive GGO on CT scan  Rhinovirus positive History of SLE on, plaquinil, prednisone, mycophenolate, Benlysta   72 female with history of SLE on Plaquenil, prednisone 10 mg, Mycophenolate, Benlysta , who came flu like symptoms for one week, and shortness of breath. Her CT chest showed extensive bilateral GGO's a dramatic change compared to CT scan on January this year. RVSP is positive for rhinovirus.  Her echo is unrevealing.  I think patient has acute interstitial pneumonia trigger by rhinovirus, in the setting of lupus/immunocompromised patient. I don't think these dramatic changes on CT scan are only related to viral pneumonia, I think there is a big contribution of her autoimmune disease. Other ddx: DAH, acute eosinophilic pneumonia (although less likely with normal eos), acute lung injury, ARDS, EVALI?SABRA  Ideally she will need sequential lavage bronch to rule out DAH, and see eos bal on AEP, however she has required initially a lot of oxygen at [facility].  Plan to high dose CS for 3 days and then transition to long taper of prednisone. She will need PJP prophylaxis. Her CXR showed remarkable improvement.  Plan: -Antibiotics completed. -Received solumedrol 250 mg IV x 3 days (10/22-25) -Transitioned to prednisone 60 mg today (10/26 -) Prednisone taper for discharge as follow:  -Prednisone 60 mg  (10/26-) for 1 week  -50 mg x 1 week  -40 mg for 2 weeks  -30 mg for 2 weeks  -20 mg for 2 weeks  -10 mg daily, her home dose prednisone  -Insulin  will need to be adjusted at discharge for prednisone taper. Per primary team. -PJP prophylaxis with Bactrim 1DS, 3 times a week MWF, at discharge.  -Restart same dose of Cellcept (1500 mg BID) at discharge . -Continue home inhalers -Incentive spirometer  at home every 2-4 hours. -Need evaluation of oxygen with walking.  -I will place a follow up a 3 weeks follow up appointment with Dr. Alaina primary pulmonologist. -I will sign off  DKA - resolved  -Adjust insulin  per primary team due to high doses of steroids.  AKI versus CKD: Renal function improving  Labs   CBC: Recent Labs  Lab 02/25/24 0728 02/26/24 0835 02/27/24 0442 02/28/24 0318 03/02/24 0433  WBC 22.1* 17.1* 13.1* 8.3 14.6*  NEUTROABS  --  15.8* 11.9* 7.5  --   HGB 9.7* 9.4* 9.7* 10.2* 10.3*  HCT 30.5* 29.7* 30.6* 31.7* 32.9*  MCV 87.4 87.4 85.7 84.8 86.1  PLT 290 282 291 304 376    Basic Metabolic Panel: Recent Labs  Lab 02/25/24 0728 02/26/24 0835 02/27/24 0442 02/28/24 0318 03/02/24 0433  NA 141 142 142 138 139  K 4.1 3.6 3.7 3.6 3.1*  CL 109 104 103 98 100  CO2 19* 22 27 28 28   GLUCOSE 173* 171* 104* 230* 182*  BUN 36* 45* 45* 37* 37*  CREATININE 1.33* 1.48* 1.42* 1.25* 1.21*  CALCIUM  8.6* 8.8* 8.8* 8.7* 8.4*  MG  --   --   --   --  2.1   GFR: Estimated Creatinine Clearance: 56.9 mL/min (A) (by C-G formula based on SCr of 1.21 mg/dL (H)). Recent Labs  Lab 02/25/24 1708 02/25/24 1940 02/26/24 0835 02/27/24 0442 02/28/24 0318 03/02/24 0433  WBC  --   --  17.1* 13.1* 8.3 14.6*  LATICACIDVEN 1.1 1.2  --   --   --   --     Liver Function Tests: Recent Labs  Lab 02/26/24 0835 02/27/24 0442 02/28/24 0318  AST 19 16 14*  ALT 10 10 9   ALKPHOS 88 88 95  BILITOT 0.2 0.2 0.2  PROT 6.1* 6.1* 6.0*  ALBUMIN 3.3* 3.4* 3.3*   No results for input(s): LIPASE, AMYLASE in the last 168 hours. No results for input(s): AMMONIA in the last 168 hours.  ABG    Component Value Date/Time   PHART 7.36 02/26/2024 0424   PCO2ART 38 02/26/2024 0424   PO2ART 72 (L) 02/26/2024 0424   HCO3 21.5 02/26/2024 0424   TCO2 25 05/10/2017 2033   ACIDBASEDEF 3.6 (H) 02/26/2024 0424   O2SAT 94.7 02/26/2024 0424      Past Medical History:  She,  has  a past medical history of Diabetes mellitus without complication (HCC), Hypertension, and Lupus.   Surgical History:   Past Surgical History:  Procedure Laterality Date   CERVICAL CONIZATION W/BX N/A 09/26/2023   Procedure: CONE BIOPSY, CERVIX;  Surgeon: Jayne Vonn DEL, MD;  Location: [facility];  Service: Gynecology;  Laterality: N/A;   SHOULDER SURGERY Left    UTERINE FIBROID SURGERY     WRIST SURGERY Right      Social History:   reports that she has been smoking cigarettes. She uses smokeless tobacco. She reports that she does not drink alcohol and does not use drugs.   Family History:  Her family history is not on file.   Allergies Allergies  Allergen Reactions   Strawberry Extract Dermatitis    Pt states she was allergic as a child but not so far as an adult.     Home Medications  Prior to Admission medications   Medication Sig Start Date End Date Taking? Authorizing Provider  amLODipine (NORVASC) 2.5 MG tablet Take 2.5 mg by mouth daily. 08/18/23   [provider]  amoxicillin-clavulanate (AUGMENTIN) 875-125 MG tablet Take 1 tablet by mouth every 12 (twelve) hours. 02/08/24   Towana Ozell BROCKS, MD  aspirin EC 81 MG tablet Take 81 mg by mouth daily. Swallow whole.    [provider]  Belimumab  (BENLYSTA  IV) Inject into the vein.    [provider]  HUMALOG 100 UNIT/ML injection Inject 100 Units into the skin daily. Via insulin  pump 01/29/24   [provider]  HYDROcodone -acetaminophen  (NORCO/VICODIN) 5-325 MG tablet Take 1 tablet by mouth every 6 (six) hours as needed. 09/26/23   Jayne Vonn DEL, MD  hydroxychloroquine (PLAQUENIL) 200 MG tablet Take 200 mg by mouth 2 (two) times daily.    [provider]  ketorolac  (TORADOL ) 10 MG tablet Take 1 tablet (10 mg total) by mouth every 8 (eight) hours as needed. 09/26/23   Jayne Vonn DEL, MD  Lancets (ACCU-CHEK MULTICLIX) lancets Use as instructed 08/01/14   Dhungel, Nishant, MD  lisinopril   (ZESTRIL ) 20 MG tablet Take 20 mg by mouth daily.    [provider]  LORazepam  (ATIVAN ) 0.5 MG tablet Take 0.5 mg by mouth 2 (two) times daily.    [provider]  medroxyPROGESTERone  (PROVERA ) 10 MG tablet Take 1 tablet (10 mg total) by mouth daily. Patient not taking: Reported on 12/18/2023 08/03/23   Jayne Vonn DEL, MD  mycophenolate (CELLCEPT) 500 MG tablet  Take 1,500 mg by mouth 2 (two) times daily.    [provider]  nicotine  (NICODERM CQ  - DOSED IN MG/24 HOURS) 14 mg/24hr patch Place 1 patch (14 mg total) onto the skin daily. 12/18/23   Mannam, Praveen, MD  ondansetron  (ZOFRAN ) 4 MG tablet Take 1 tablet (4 mg total) by mouth every 6 (six) hours as needed for nausea. Patient not taking: Reported on 12/18/2023 05/14/17   Cheryle Page, MD  ondansetron  (ZOFRAN -ODT) 8 MG disintegrating tablet Take 1 tablet (8 mg total) by mouth every 8 (eight) hours as needed for nausea or vomiting. 02/08/24   Towana Ozell BROCKS, MD  oxyCODONE-acetaminophen  (PERCOCET/ROXICET) 5-325 MG tablet Take 1 tablet by mouth every 6 (six) hours as needed for severe pain (pain score 7-10). 02/08/24   Towana Ozell BROCKS, MD  pantoprazole  (PROTONIX ) 40 MG tablet Take 1 tablet (40 mg total) by mouth daily. Patient not taking: Reported on 12/18/2023 05/14/17   Cheryle Page, MD  predniSONE (DELTASONE) 10 MG tablet Take 15 mg by mouth daily with breakfast.    [provider]  pregabalin (LYRICA) 150 MG capsule Take 150 mg by mouth 3 (three) times daily.    [provider]  promethazine  (PHENERGAN ) 25 MG tablet Take 1 tablet (25 mg total) by mouth every 6 (six) hours as needed for nausea or vomiting. Patient not taking: Reported on 12/18/2023 04/24/18   Norris Will PARAS, PA-C  rosuvastatin (CRESTOR) 10 MG tablet Take 10 mg by mouth daily.    [provider]  sertraline  (ZOLOFT ) 100 MG tablet Take 200 mg by mouth daily.    [provider]  Varenicline  Tartrate, Starter,  (CHANTIX  STARTING MONTH PAK) 0.5 MG X 11 & 1 MG X 42 TBPK Take 1 tablet by mouth daily. 12/18/23   Mannam, Praveen, MD     Marny Patch, MD Pulmonary, Critical Care Pager: [phone]  03/02/2024, 10:06 AM

## 2024-03-02 NOTE — Progress Notes (Signed)
 SATURATION QUALIFICATIONS: (This note is used to comply with regulatory documentation for home oxygen)  Patient Saturations on Room Air at Rest = 93%  Patient Saturations on Room Air while Ambulating = 88%  Patient Saturations on 0 Liters of oxygen while Ambulating = NA%  Please briefly explain why patient needs home oxygen:

## 2024-03-02 NOTE — Plan of Care (Signed)
 Problem: Education: Goal: Knowledge of General Education information will improve Description: Including pain rating scale, medication(s)/side effects and non-pharmacologic comfort measures Outcome: Adequate for Discharge   Problem: Health Behavior/Discharge Planning: Goal: Ability to manage health-related needs will improve Outcome: Adequate for Discharge   Problem: Clinical Measurements: Goal: Ability to maintain clinical measurements within normal limits will improve Outcome: Adequate for Discharge Goal: Will remain free from infection Outcome: Adequate for Discharge Goal: Diagnostic test results will improve Outcome: Adequate for Discharge Goal: Respiratory complications will improve Outcome: Adequate for Discharge Goal: Cardiovascular complication will be avoided Outcome: Adequate for Discharge   Problem: Activity: Goal: Risk for activity intolerance will decrease Outcome: Adequate for Discharge   Problem: Nutrition: Goal: Adequate nutrition will be maintained Outcome: Adequate for Discharge   Problem: Coping: Goal: Level of anxiety will decrease Outcome: Adequate for Discharge   Problem: Elimination: Goal: Will not experience complications related to bowel motility Outcome: Adequate for Discharge Goal: Will not experience complications related to urinary retention Outcome: Adequate for Discharge   Problem: Pain Managment: Goal: General experience of comfort will improve and/or be controlled Outcome: Adequate for Discharge   Problem: Safety: Goal: Ability to remain free from injury will improve Outcome: Adequate for Discharge   Problem: Skin Integrity: Goal: Risk for impaired skin integrity will decrease Outcome: Adequate for Discharge   Problem: Education: Goal: Ability to describe self-care measures that may prevent or decrease complications (Diabetes Survival Skills Education) will improve Outcome: Adequate for Discharge Goal: Individualized Educational  Video(s) Outcome: Adequate for Discharge   Problem: Coping: Goal: Ability to adjust to condition or change in health will improve Outcome: Adequate for Discharge   Problem: Fluid Volume: Goal: Ability to maintain a balanced intake and output will improve Outcome: Adequate for Discharge   Problem: Health Behavior/Discharge Planning: Goal: Ability to identify and utilize available resources and services will improve Outcome: Adequate for Discharge Goal: Ability to manage health-related needs will improve Outcome: Adequate for Discharge   Problem: Metabolic: Goal: Ability to maintain appropriate glucose levels will improve Outcome: Adequate for Discharge   Problem: Nutritional: Goal: Maintenance of adequate nutrition will improve Outcome: Adequate for Discharge Goal: Progress toward achieving an optimal weight will improve Outcome: Adequate for Discharge   Problem: Skin Integrity: Goal: Risk for impaired skin integrity will decrease Outcome: Adequate for Discharge   Problem: Tissue Perfusion: Goal: Adequacy of tissue perfusion will improve Outcome: Adequate for Discharge   Problem: Education: Goal: Ability to describe self-care measures that may prevent or decrease complications (Diabetes Survival Skills Education) will improve Outcome: Adequate for Discharge Goal: Individualized Educational Video(s) Outcome: Adequate for Discharge   Problem: Cardiac: Goal: Ability to maintain an adequate cardiac output will improve Outcome: Adequate for Discharge   Problem: Health Behavior/Discharge Planning: Goal: Ability to identify and utilize available resources and services will improve Outcome: Adequate for Discharge Goal: Ability to manage health-related needs will improve Outcome: Adequate for Discharge   Problem: Fluid Volume: Goal: Ability to achieve a balanced intake and output will improve Outcome: Adequate for Discharge   Problem: Metabolic: Goal: Ability to maintain  appropriate glucose levels will improve Outcome: Adequate for Discharge   Problem: Nutritional: Goal: Maintenance of adequate nutrition will improve Outcome: Adequate for Discharge Goal: Maintenance of adequate weight for body size and type will improve Outcome: Adequate for Discharge   Problem: Respiratory: Goal: Will regain and/or maintain adequate ventilation Outcome: Adequate for Discharge   Problem: Urinary Elimination: Goal: Ability to achieve and maintain adequate renal perfusion and  functioning will improve Outcome: Adequate for Discharge   Problem: Acute Rehab PT Goals(only PT should resolve) Goal: Pt Will Go Supine/Side To Sit Outcome: Adequate for Discharge Goal: Pt Will Perform Standing Balance Or Pre-Gait Outcome: Adequate for Discharge Goal: Pt Will Ambulate Outcome: Adequate for Discharge Goal: Pt Will Go Up/Down Stairs Outcome: Adequate for Discharge

## 2024-03-02 NOTE — TOC Transition Note (Signed)
 Transition of Care [facility]) - Discharge Note   Patient Details  Name: [name] MRN: [id number] Date of Birth: 1980-05-16  [facility]) CM/SW Contact:  Hoy DELENA Bigness, LCSW Phone Number: 03/02/2024, 12:16 PM   Clinical Narrative:    Pt to return home with spouse. Nebulizer machine and home O2 ordered through [employer]. Travel tank delivered to pt's room. [employer] will deliver nebulizer machine and more oxygen to pt's house when she gets home. Pt agreeable to OPPT. Referral made to OP rehab office in [place].    Final next level of care: OP Rehab Barriers to Discharge: Barriers Resolved   Patient Goals and CMS Choice Patient states their goals for this hospitalization and ongoing recovery are:: return home CMS Medicare.gov Compare Post Acute Care list provided to:: Patient Choice offered to / list presented to : Patient      Discharge Placement                       Discharge Plan and Services Additional resources added to the After Visit Summary for                  DME Arranged: Oxygen, Nebulizer machine DME Agency: [facility] Date DME Agency Contacted: 03/02/24 Time DME Agency Contacted: 1216 Representative spoke with at DME Agency: Ethan            Social Drivers of Health (SDOH) Interventions SDOH Screenings   Food Insecurity: No Food Insecurity (02/23/2024)  Housing: Low Risk  (02/23/2024)  Transportation Needs: No Transportation Needs (02/23/2024)  Utilities: Not At Risk (02/23/2024)  Alcohol Screen: Low Risk  (07/16/2023)  Depression (PHQ2-9): Low Risk  (07/16/2023)  Financial Resource Strain: Low Risk  (07/16/2023)  Physical Activity: Inactive (07/16/2023)  Social Connections: Moderately Isolated (07/16/2023)  Stress: No Stress Concern Present (07/16/2023)  Tobacco Use: High Risk (02/22/2024)     Readmission Risk Interventions    03/01/2024   12:26 PM  Readmission Risk Prevention Plan  Transportation Screening Complete   Medication Review (RN Care Manager) Complete  PCP or Specialist appointment within 3-5 days of discharge Complete  HRI or Home Care Consult Not Complete  HRI or Home Care Consult Pt Refusal Comments Pt home with spouse  SW Recovery Care/Counseling Consult Complete

## 2024-03-02 NOTE — Progress Notes (Signed)
 Pt discharged home with spouse in stable condition. DC instructions given. Scripts sent to pharmacy of choice. No immediate questions or concerns at this time. Will DC from unit via wheelchair.

## 2024-03-03 ENCOUNTER — Encounter: Payer: Self-pay | Admitting: Pulmonary Disease

## 2024-03-03 NOTE — Telephone Encounter (Signed)
 ATC 1x sent LTR left VM

## 2024-03-13 ENCOUNTER — Other Ambulatory Visit

## 2024-03-19 ENCOUNTER — Ambulatory Visit: Admitting: Pulmonary Disease

## 2024-03-19 ENCOUNTER — Encounter: Payer: Self-pay | Admitting: Pulmonary Disease

## 2024-03-19 ENCOUNTER — Other Ambulatory Visit

## 2024-03-19 VITALS — BP 130/60 | HR 81 | Temp 98.2°F | Ht 63.0 in | Wt 163.0 lb

## 2024-03-19 DIAGNOSIS — M329 Systemic lupus erythematosus, unspecified: Secondary | ICD-10-CM

## 2024-03-19 DIAGNOSIS — J849 Interstitial pulmonary disease, unspecified: Secondary | ICD-10-CM

## 2024-03-19 DIAGNOSIS — M3210 Systemic lupus erythematosus, organ or system involvement unspecified: Secondary | ICD-10-CM

## 2024-03-19 DIAGNOSIS — F17210 Nicotine dependence, cigarettes, uncomplicated: Secondary | ICD-10-CM

## 2024-03-19 DIAGNOSIS — J189 Pneumonia, unspecified organism: Secondary | ICD-10-CM

## 2024-03-19 DIAGNOSIS — E119 Type 2 diabetes mellitus without complications: Secondary | ICD-10-CM | POA: Diagnosis not present

## 2024-03-19 DIAGNOSIS — Z9225 Personal history of immunosupression therapy: Secondary | ICD-10-CM | POA: Diagnosis not present

## 2024-03-19 MED ORDER — VARENICLINE TARTRATE (STARTER) 0.5 MG X 11 & 1 MG X 42 PO TBPK: 1.0000 | ORAL_TABLET | Freq: Every day | ORAL | 2 refills | Status: AC

## 2024-03-19 MED ORDER — SULFAMETHOXAZOLE-TRIMETHOPRIM 800-160 MG PO TABS: 1.0000 | ORAL_TABLET | ORAL | 1 refills | Status: AC

## 2024-03-19 MED ORDER — NICOTINE 14 MG/24HR TD PT24: 14.0000 mg | MEDICATED_PATCH | Freq: Every day | TRANSDERMAL | 0 refills | Status: AC

## 2024-03-19 NOTE — Patient Instructions (Signed)
  VISIT SUMMARY: You had a follow-up visit after being hospitalized for pneumonia. We discussed your ongoing treatment for pneumonia, lupus, and other health concerns, including your efforts to quit smoking.  YOUR PLAN: PNEUMONIA AND RESOLVING RHINOVIRUS/ENTEROVIRUS INFECTION: You were recently hospitalized for pneumonia and a viral infection, likely worsened by your lupus treatment. -Continue prednisone taper as per discharge instructions, decreasing by 10 mg every two weeks. -Take Bactrim three times a week until your prednisone dose is reduced to 10 mg. -Continue Augmentin for 7 days for sinus drainage and ear fluid. -A follow-up CT scan is scheduled for mid-December to check the resolution of pneumonia. -Plan for a lung function test in January after you have recovered from the infection.  SYSTEMIC LUPUS ERYTHEMATOSUS WITH CUTANEOUS INVOLVEMENT: Your lupus is being managed with several medications, and you have skin symptoms that may need further evaluation. -Continue taking Saphnelo, Cellcept, Plaquenil, and prednisone as prescribed. -You have been referred to dermatology for evaluation and management of your skin symptoms.  TYPE 2 DIABETES MELLITUS: You had a recent episode of ketoacidosis during your hospitalization. -Continue managing your diabetes with your current medications and monitoring.  NICOTINE  DEPENDENCE, CURRENTLY QUITTING: You are in the process of quitting smoking and have significantly reduced your smoking. -Continue using nicotine  patches and Chantix  to support your smoking cessation efforts.

## 2024-03-19 NOTE — Progress Notes (Signed)
 [name]    [id number]    [birth year]  Primary Care Physician:Patient, No Pcp Per  Referring Physician: Bertell Satterfield, MD [address] [place],  [place] [address]  Chief complaint: Follow-up for ILD evaluation, lupus  HPI: 43 y.o. who  has a past medical history of Diabetes mellitus without complication (HCC), Hypertension, and Lupus.  Discussed the use of AI scribe software for clinical note transcription with the patient, who gave verbal consent to proceed.  History of Present Illness [name] is a 43 year old female with lupus who presents for a pulmonary evaluation of interstitial lung disease  Pulmonary symptoms and imaging - No current lung-related symptoms including wheezing or chest congestion - No use of inhalers - Occasional wheezing and chest congestion attributed to sinus issues - No significant lung symptoms - No history of asthma  Upper respiratory and allergy symptoms - Current sinus congestion - No cold-induced color changes in fingers  Connective tissue disease manifestations - Diagnosed with lupus around 2023 - Symptoms include swelling and knots on hands, and joint pain - Current medications: Saphnelo [previously on Benlysta ], mycophenolate, Plaquenil, prednisone 10 mg daily.  She follows with Dr. Curt  Interim history: [name] is a 43 year old female with lupus who presents for follow-up after hospitalization for pneumonia.  Pulmonary symptoms and recent pneumonia - Sudden onset of pneumonia symptoms following cleaning at home - Required hospitalization for management.  Noted to have rhinovirus/enterovirus positive.  Negative cultures - Received antibiotics, IV Solu-Medrol, oxygen, Zosyn, and Lasix during admission - Currently on a prednisone taper at 50 mg, with plans to decrease by 10 mg every two weeks  Adverse effects of corticosteroid therapy - Jitteriness and increased appetite associated with prednisone  use  Upper respiratory symptoms - Augmentin prescribed for sinus drainage and fluid in ear  Relevant Pulmonary history: Pets: Has a dog at home Occupation:Works as a Arts Administrator in a rehabilitation setting Exposures: Normal, heart, Jacuzzi.  No fair pillows or comforters No h/o chemo/XRT/amiodarone/macrodantin/MTX  No exposure to asbestos, silica or other organic allergens  Smoking history:Continues to smoke, with a 20-year history of tobacco use Travel history: No significant travel history Family history: No family history of lung disease  Outpatient Encounter Medications as of 03/19/2024  Medication Sig   amLODipine (NORVASC) 5 MG tablet Take 1 tablet (5 mg total) by mouth daily.   aspirin EC 81 MG tablet Take 81 mg by mouth daily. Swallow whole.   HUMALOG 100 UNIT/ML injection Inject 100 Units into the skin daily. Via insulin  pump   [Paused] hydroxychloroquine (PLAQUENIL) 200 MG tablet Take 200 mg by mouth 2 (two) times daily.   lisinopril  (ZESTRIL ) 20 MG tablet Take 20 mg by mouth daily.   LORazepam  (ATIVAN ) 0.5 MG tablet Take 0.5 mg by mouth 2 (two) times daily.   mycophenolate (CELLCEPT) 500 MG tablet Take 1,500 mg by mouth 2 (two) times daily.   ondansetron  (ZOFRAN -ODT) 8 MG disintegrating tablet Take 1 tablet (8 mg total) by mouth every 8 (eight) hours as needed for nausea or vomiting.   pantoprazole  (PROTONIX ) 40 MG tablet Take 1 tablet (40 mg total) by mouth daily.   predniSONE (DELTASONE) 10 MG tablet Take 6 tablets (60 mg total) by mouth daily with breakfast for 13 days, THEN 5 tablets (50 mg total) daily with breakfast for 14 days, THEN 3 tablets (30 mg total) daily with breakfast for 14 days,  THEN 2 tablets (20 mg total) daily with breakfast for 14 days, THEN 1 tablet (10 mg total) daily with breakfast for 14 days.   pregabalin (LYRICA) 150 MG capsule Take 150 mg by mouth 3 (three) times daily.   rosuvastatin (CRESTOR) 10 MG tablet Take 10 mg by mouth daily.    sertraline  (ZOLOFT ) 100 MG tablet Take 200 mg by mouth daily.   sulfamethoxazole-trimethoprim (BACTRIM DS) 800-160 MG tablet Take 1 tablet by mouth 3 (three) times a week.   zinc sulfate, 50mg  elemental zinc, 220 (50 Zn) MG capsule Take 1 capsule (220 mg total) by mouth daily.   ascorbic acid (VITAMIN C) 500 MG tablet Take 1 tablet (500 mg total) by mouth daily. (Patient not taking: Reported on 03/19/2024)   [Paused] Belimumab  (BENLYSTA  IV) Inject into the vein. (Patient not taking: Reported on 03/19/2024)   benzonatate (TESSALON) 200 MG capsule Take 1 capsule (200 mg total) by mouth 3 (three) times daily as needed for cough. (Patient not taking: Reported on 03/19/2024)   ipratropium-albuterol (DUONEB) 0.5-2.5 (3) MG/3ML SOLN Take 3 mLs by nebulization every 6 (six) hours. (Patient not taking: Reported on 03/19/2024)   lidocaine  (LIDODERM ) 5 % Place 1 patch onto the skin daily. Remove & Discard patch within 12 hours or as directed by MD (Patient not taking: Reported on 03/19/2024)   nicotine  (NICODERM CQ  - DOSED IN MG/24 HOURS) 14 mg/24hr patch Place 1 patch (14 mg total) onto the skin daily. (Patient not taking: Reported on 03/19/2024)   oxyCODONE-acetaminophen  (PERCOCET/ROXICET) 5-325 MG tablet Take 1 tablet by mouth every 8 (eight) hours as needed for severe pain (pain score 7-10). (Patient not taking: Reported on 03/19/2024)   polyethylene glycol (MIRALAX / GLYCOLAX) 17 g packet Take 17 g by mouth daily as needed. (Patient not taking: Reported on 03/19/2024)   Varenicline  Tartrate, Starter, (CHANTIX  STARTING MONTH PAK) 0.5 MG X 11 & 1 MG X 42 TBPK Take 1 tablet by mouth daily. (Patient not taking: Reported on 03/19/2024)   No facility-administered encounter medications on file as of 03/19/2024.     Physical Exam: Today's Vitals   03/19/24 1356  BP: (!) 170/84  Pulse: 81  Temp: 98.2 F (36.8 C)  TempSrc: Oral  SpO2: 100%  Weight: 163 lb (73.9 kg)  Height: 5' 3 (1.6 m)   Body mass  index is 28.87 kg/m.  Physical Exam  GEN: No acute distress. CV: Regular rate and rhythm, no murmurs. LUNGS: Clear to auscultation bilaterally, normal respiratory effort. SKIN JOINTS: Warm and dry, no rash.  Data Reviewed: Imaging: High resolution CT 06/07/2023- Mild patchy air trapping, nonspecific ground glass opacities in both lungs.  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  Trace pericardial effusion  CTA 02/23/2024-no pulmonary embolism, extensive bilateral ground glass airspace disease, mild enlargement of mediastinal and hilar lymph nodes.  PFTs:  Labs:  Assessment & Plan Pneumonia and resolving rhinovirus/enterovirus infection in the setting of immunosuppression Recent hospitalization for pneumonia and rhinovirus/enterovirus infection, likely exacerbated by immunosuppressive therapy for systemic lupus erythematosus. Treated with IV Solu-Medrol, Zosyn, Lasix, and oxygen. Currently on a prednisone taper, experiencing jitteriness and increased appetite. Bactrim prescribed as prophylaxis against pneumocystis pneumonia due to high-dose prednisone use. Augmentin prescribed for sinus drainage and ear fluid. - Continue prednisone taper as per discharge instructions. - Prescribed Bactrim prophylactically three times a week until prednisone dose is reduced to 10 mg. - Continue Augmentin for 7 days for sinus drainage and ear fluid. - Ordered follow-up CT scan in mid-December to assess resolution  of pneumonia. - Plan for lung function test in January after recovery from infection.  Systemic lupus erythematosus with cutaneous involvement Systemic lupus erythematosus managed with Saphnelo, Cellcept, Plaquenil, and prednisone. Recent switch from Benlysta  to Saphnelo due to persistent symptoms.  Her treatment is being managed by Dr. Curt, rheumatology.  Cutaneous involvement with skin changes and occasional rashes on elbows. Dermatology referral considered for further management of skin symptoms. - Continue  Saphnelo, Cellcept, Plaquenil, and prednisone as per current regimen. - Referred to dermatology for evaluation and management of cutaneous lupus symptoms. - Review CT and PFTs for underlying interstitial lung disease  Type 2 diabetes mellitus Recent episode of ketoacidosis during hospitalization. Currently managed with medications and monitoring.  Nicotine  dependence, currently quitting Currently quitting smoking with the use of nicotine  patches and Chantix . Reports significant reduction in smoking.  Time spent counseling-3 minutes. - Prescribed nicotine  patches and Chantix  to support smoking cessation.  Recommendations: Follow-up high resolution CT, PFTs Smoking cessation with nicotine  patches, Chantix  Dermatology referral  I personally spent a total of 31 minutes in the care of the patient today including preparing to see the patient, getting/reviewing separately obtained history, performing a medically appropriate exam/evaluation, counseling and educating, placing orders, independently interpreting results, and communicating results.   Lonna Coder MD [facility] 03/19/2024, 2:12 PM  CC: Bertell Satterfield, MD

## 2024-04-10 ENCOUNTER — Other Ambulatory Visit

## 2024-05-19 ENCOUNTER — Encounter (INDEPENDENT_AMBULATORY_CARE_PROVIDER_SITE_OTHER): Payer: Self-pay | Admitting: Ophthalmology

## 2024-05-27 ENCOUNTER — Ambulatory Visit: Admitting: Pulmonary Disease

## 2024-05-27 ENCOUNTER — Encounter

## 2024-05-27 ENCOUNTER — Encounter: Payer: Self-pay | Admitting: Pulmonary Disease

## 2024-06-02 ENCOUNTER — Other Ambulatory Visit

## 2024-06-17 ENCOUNTER — Other Ambulatory Visit

## 2024-10-01 ENCOUNTER — Ambulatory Visit: Admitting: Dermatology
